# Patient Record
Sex: Male | Born: 1950 | Race: White | Hispanic: No | Marital: Married | Smoking: Former smoker
Health system: Southern US, Community
[De-identification: ages and names within clinical notes are randomized; demographics above are authoritative.]

## PROBLEM LIST (undated history)

## (undated) DIAGNOSIS — I10 Essential (primary) hypertension: Secondary | ICD-10-CM

## (undated) DIAGNOSIS — M4802 Spinal stenosis, cervical region: Secondary | ICD-10-CM

## (undated) DIAGNOSIS — E291 Testicular hypofunction: Secondary | ICD-10-CM

## (undated) DIAGNOSIS — F32A Depression, unspecified: Secondary | ICD-10-CM

## (undated) DIAGNOSIS — R7303 Prediabetes: Secondary | ICD-10-CM

## (undated) DIAGNOSIS — M109 Gout, unspecified: Secondary | ICD-10-CM

## (undated) DIAGNOSIS — I714 Abdominal aortic aneurysm, without rupture, unspecified: Secondary | ICD-10-CM

## (undated) DIAGNOSIS — E559 Vitamin D deficiency, unspecified: Secondary | ICD-10-CM

## (undated) DIAGNOSIS — F419 Anxiety disorder, unspecified: Secondary | ICD-10-CM

## (undated) DIAGNOSIS — K219 Gastro-esophageal reflux disease without esophagitis: Secondary | ICD-10-CM

## (undated) DIAGNOSIS — H409 Unspecified glaucoma: Secondary | ICD-10-CM

## (undated) DIAGNOSIS — F329 Major depressive disorder, single episode, unspecified: Secondary | ICD-10-CM

## (undated) DIAGNOSIS — E785 Hyperlipidemia, unspecified: Secondary | ICD-10-CM

## (undated) HISTORY — DX: Gastro-esophageal reflux disease without esophagitis: K21.9

## (undated) HISTORY — DX: Spinal stenosis, cervical region: M48.02

## (undated) HISTORY — DX: Gout, unspecified: M10.9

## (undated) HISTORY — DX: Hyperlipidemia, unspecified: E78.5

## (undated) HISTORY — DX: Abdominal aortic aneurysm, without rupture, unspecified: I71.40

## (undated) HISTORY — DX: Prediabetes: R73.03

## (undated) HISTORY — DX: Vitamin D deficiency, unspecified: E55.9

## (undated) HISTORY — DX: Depression, unspecified: F32.A

## (undated) HISTORY — DX: Essential (primary) hypertension: I10

## (undated) HISTORY — DX: Testicular hypofunction: E29.1

## (undated) HISTORY — DX: Anxiety disorder, unspecified: F41.9

## (undated) HISTORY — DX: Unspecified glaucoma: H40.9

## (undated) HISTORY — DX: Major depressive disorder, single episode, unspecified: F32.9

---

## 1998-05-13 ENCOUNTER — Ambulatory Visit (HOSPITAL_COMMUNITY): Admission: RE | Admit: 1998-05-13 | Discharge: 1998-05-13 | Payer: Self-pay | Admitting: Internal Medicine

## 1999-05-19 ENCOUNTER — Encounter: Payer: Self-pay | Admitting: Internal Medicine

## 1999-05-19 ENCOUNTER — Ambulatory Visit (HOSPITAL_COMMUNITY): Admission: RE | Admit: 1999-05-19 | Discharge: 1999-05-19 | Payer: Self-pay | Admitting: Internal Medicine

## 2000-06-18 ENCOUNTER — Ambulatory Visit (HOSPITAL_COMMUNITY): Admission: RE | Admit: 2000-06-18 | Discharge: 2000-06-18 | Payer: Self-pay | Admitting: Internal Medicine

## 2000-06-18 ENCOUNTER — Encounter: Payer: Self-pay | Admitting: Internal Medicine

## 2001-06-21 ENCOUNTER — Ambulatory Visit (HOSPITAL_COMMUNITY): Admission: RE | Admit: 2001-06-21 | Discharge: 2001-06-21 | Payer: Self-pay

## 2001-06-21 ENCOUNTER — Encounter: Payer: Self-pay | Admitting: Internal Medicine

## 2002-09-02 ENCOUNTER — Ambulatory Visit (HOSPITAL_COMMUNITY): Admission: RE | Admit: 2002-09-02 | Discharge: 2002-09-02 | Payer: Self-pay | Admitting: Internal Medicine

## 2002-09-02 ENCOUNTER — Encounter: Payer: Self-pay | Admitting: Internal Medicine

## 2003-10-29 ENCOUNTER — Ambulatory Visit (HOSPITAL_COMMUNITY): Admission: RE | Admit: 2003-10-29 | Discharge: 2003-10-29 | Payer: Self-pay | Admitting: Internal Medicine

## 2004-02-02 ENCOUNTER — Encounter: Admission: RE | Admit: 2004-02-02 | Discharge: 2004-02-02 | Payer: Self-pay | Admitting: Internal Medicine

## 2004-04-17 ENCOUNTER — Emergency Department (HOSPITAL_COMMUNITY): Admission: EM | Admit: 2004-04-17 | Discharge: 2004-04-17 | Payer: Self-pay | Admitting: Emergency Medicine

## 2004-09-14 ENCOUNTER — Ambulatory Visit (HOSPITAL_COMMUNITY): Admission: RE | Admit: 2004-09-14 | Discharge: 2004-09-14 | Payer: Self-pay | Admitting: Internal Medicine

## 2005-09-19 ENCOUNTER — Ambulatory Visit (HOSPITAL_COMMUNITY): Admission: RE | Admit: 2005-09-19 | Discharge: 2005-09-19 | Payer: Self-pay | Admitting: Internal Medicine

## 2010-11-02 ENCOUNTER — Ambulatory Visit (HOSPITAL_COMMUNITY)
Admission: RE | Admit: 2010-11-02 | Discharge: 2010-11-02 | Disposition: A | Discharge status: Home / Self Care | Payer: 59 | Source: Ambulatory Visit | Attending: Internal Medicine | Admitting: Internal Medicine

## 2010-11-02 ENCOUNTER — Other Ambulatory Visit (HOSPITAL_COMMUNITY): Payer: Self-pay | Admitting: Internal Medicine

## 2010-11-02 DIAGNOSIS — R209 Unspecified disturbances of skin sensation: Secondary | ICD-10-CM | POA: Insufficient documentation

## 2010-11-02 DIAGNOSIS — R52 Pain, unspecified: Secondary | ICD-10-CM

## 2010-11-02 DIAGNOSIS — M1990 Unspecified osteoarthritis, unspecified site: Secondary | ICD-10-CM | POA: Insufficient documentation

## 2011-03-22 ENCOUNTER — Other Ambulatory Visit: Payer: Self-pay | Admitting: Internal Medicine

## 2011-03-22 DIAGNOSIS — R200 Anesthesia of skin: Secondary | ICD-10-CM

## 2011-03-23 ENCOUNTER — Ambulatory Visit
Admission: RE | Admit: 2011-03-23 | Discharge: 2011-03-23 | Disposition: A | Discharge status: Home / Self Care | Payer: 59 | Source: Ambulatory Visit | Attending: Internal Medicine | Admitting: Internal Medicine

## 2011-03-23 DIAGNOSIS — R200 Anesthesia of skin: Secondary | ICD-10-CM

## 2013-06-13 ENCOUNTER — Other Ambulatory Visit: Payer: Self-pay | Admitting: Internal Medicine

## 2013-07-12 ENCOUNTER — Other Ambulatory Visit: Payer: Self-pay | Admitting: Internal Medicine

## 2013-07-21 ENCOUNTER — Other Ambulatory Visit: Payer: Self-pay | Admitting: Emergency Medicine

## 2013-07-21 MED ORDER — CITALOPRAM HYDROBROMIDE 40 MG PO TABS: ORAL_TABLET | ORAL | Status: DC

## 2013-08-23 ENCOUNTER — Encounter: Payer: Self-pay | Admitting: Physician Assistant

## 2013-08-23 DIAGNOSIS — I10 Essential (primary) hypertension: Secondary | ICD-10-CM

## 2013-08-23 DIAGNOSIS — R7303 Prediabetes: Secondary | ICD-10-CM

## 2013-08-23 DIAGNOSIS — Z79899 Other long term (current) drug therapy: Secondary | ICD-10-CM

## 2013-08-23 DIAGNOSIS — M109 Gout, unspecified: Secondary | ICD-10-CM

## 2013-08-23 DIAGNOSIS — E559 Vitamin D deficiency, unspecified: Secondary | ICD-10-CM

## 2013-08-23 DIAGNOSIS — F419 Anxiety disorder, unspecified: Secondary | ICD-10-CM

## 2013-08-23 DIAGNOSIS — K219 Gastro-esophageal reflux disease without esophagitis: Secondary | ICD-10-CM

## 2013-08-23 DIAGNOSIS — E782 Mixed hyperlipidemia: Secondary | ICD-10-CM | POA: Insufficient documentation

## 2013-08-23 DIAGNOSIS — R7309 Other abnormal glucose: Secondary | ICD-10-CM | POA: Insufficient documentation

## 2013-08-23 DIAGNOSIS — E785 Hyperlipidemia, unspecified: Secondary | ICD-10-CM

## 2013-08-23 DIAGNOSIS — E291 Testicular hypofunction: Secondary | ICD-10-CM

## 2013-08-25 ENCOUNTER — Encounter: Payer: Self-pay | Admitting: Internal Medicine

## 2013-09-02 ENCOUNTER — Encounter: Payer: Self-pay | Admitting: Internal Medicine

## 2013-09-27 ENCOUNTER — Other Ambulatory Visit: Payer: Self-pay | Admitting: Internal Medicine

## 2013-10-12 ENCOUNTER — Other Ambulatory Visit: Payer: Self-pay | Admitting: Internal Medicine

## 2013-12-20 ENCOUNTER — Other Ambulatory Visit: Payer: Self-pay | Admitting: Internal Medicine

## 2014-01-02 ENCOUNTER — Ambulatory Visit (INDEPENDENT_AMBULATORY_CARE_PROVIDER_SITE_OTHER): Payer: BC Managed Care – PPO | Admitting: Internal Medicine

## 2014-01-02 ENCOUNTER — Encounter: Payer: Self-pay | Admitting: Internal Medicine

## 2014-01-02 VITALS — BP 116/80 | HR 52 | Temp 97.7°F | Resp 16 | Ht 73.5 in | Wt 211.0 lb

## 2014-01-02 DIAGNOSIS — Z1212 Encounter for screening for malignant neoplasm of rectum: Secondary | ICD-10-CM

## 2014-01-02 DIAGNOSIS — Z125 Encounter for screening for malignant neoplasm of prostate: Secondary | ICD-10-CM

## 2014-01-02 DIAGNOSIS — R7402 Elevation of levels of lactic acid dehydrogenase (LDH): Secondary | ICD-10-CM

## 2014-01-02 DIAGNOSIS — Z113 Encounter for screening for infections with a predominantly sexual mode of transmission: Secondary | ICD-10-CM

## 2014-01-02 DIAGNOSIS — I10 Essential (primary) hypertension: Secondary | ICD-10-CM

## 2014-01-02 DIAGNOSIS — R7401 Elevation of levels of liver transaminase levels: Secondary | ICD-10-CM

## 2014-01-02 DIAGNOSIS — R740 Nonspecific elevation of levels of transaminase and lactic acid dehydrogenase [LDH]: Secondary | ICD-10-CM

## 2014-01-02 DIAGNOSIS — Z0000 Encounter for general adult medical examination without abnormal findings: Secondary | ICD-10-CM

## 2014-01-02 DIAGNOSIS — E559 Vitamin D deficiency, unspecified: Secondary | ICD-10-CM

## 2014-01-02 DIAGNOSIS — Z111 Encounter for screening for respiratory tuberculosis: Secondary | ICD-10-CM

## 2014-01-02 NOTE — Patient Instructions (Signed)

## 2014-01-02 NOTE — Progress Notes (Signed)
Patient ID: Cory Carrillo, male   DOB: Apr 06, 1951, 63 y.o.   MRN: [id number]   Annual Screening Comprehensive Examination  This very nice 62 y.o.MWM presents for complete physical.  Patient has been followed for HTN, testosterone ,  Prediabetes, Hyperlipidemia, and Vitamin D Deficiency.   HTN predates since 53. Patient's BP has been controlled at home.Today's BP: 116/80 mmHg. Pulse is noted 41-45 bpm on EKG today and patient does report occasional postural lightheadedness with standing. He does report random albeit infrequent BP's have been Normal wit pulses usually about 50-60 bpm. Patient denies any cardiac symptoms as chest pain, palpitations, shortness of breath, dizziness or ankle swelling.   Patient's hyperlipidemia is controlled with diet and medications. Patient denies myalgias or other medication SE's. Last cholesterol last visit was 182, triglycerides 135, HDL 45 and LDL 110 in April 2014.     Patient has prediabetes with A1c 5.7% in Feb 2012 and last A1c was 5.4% in  Jan 2014. Patient denies reactive hypoglycemic symptoms, visual blurring, diabetic polys or paresthesias.    Finally, patient has history of Testosterone Deficiency with Testosterone 247 in 2009 and 287 in 2011 and also has  Vitamin D Deficiency with A1c 5.7% in 2012.    and last vitamin D was 79 in Apr 2014   Medication List   aspirin 81 MG tablet  Take 81 mg by mouth daily.     atenolol 100 MG tablet  Commonly known as:  TENORMIN  TAKE 1 TABLET EVERY DAY FOR BLOOD PRESSURE     atorvastatin 80 MG tablet  Commonly known as:  LIPITOR  TAKE 1 TABLET EVERY DAY FOR CHOLESTEROL     citalopram 40 MG tablet  Commonly known as:  CELEXA  TAKE 1 TABLET EVERY DAY FOR MOOD     DIOVAN 320 MG tablet  Generic drug:  valsartan  TAKE AS DIRECTED     finasteride 5 MG tablet  Commonly known as:  PROSCAR  Take 5 mg by mouth daily.     latanoprost 0.005 % ophthalmic solution  Commonly known as:  XALATAN  Place 1 drop into  both eyes at bedtime.     MULTIVITAMIN PO  Take by mouth daily.     sildenafil 100 MG tablet  Commonly known as:  VIAGRA  Take 100 mg by mouth daily as needed for erectile dysfunction.     testosterone cypionate 200 MG/ML injection  Commonly known as:  DEPOTESTOTERONE CYPIONATE  INJECT 1.5 CC(ML) INTRAMUSCULARLY EVERY 2 WEEKS     VITAMIN D PO  Take 2,000 Units by mouth 2 (two) times daily.     Allergies  Allergen Reactions  . Ace Inhibitors     cough   Past Medical History  Diagnosis Date  . Hypertension   . Hyperlipidemia   . GERD (gastroesophageal reflux disease)   . Glaucoma   . Prediabetes   . Gout   . Anxiety   . Depression   . Cervical stenosis of spine   . Hypogonadism male   . Vitamin D deficiency    History reviewed. No pertinent past surgical history.  Family History  Problem Relation Age of Onset  . Cancer Mother     colon  . Heart disease Father   . Heart disease Brother   . Hypertension Brother    History   Social History  . Marital Status: Married    Spouse Name: N/A    Number of Children: N/A  . Years of Education: N/A  Occupational History  .  works in Physicist, medicalaudio- Associate Professorvisual field.   Social History Main Topics  . Smoking status: Former Smoker -- 1.50 packs/day    Quit date: 08/23/2006  . Smokeless tobacco: Never Used  . Alcohol Use: Yes  . Drug Use: No  . Sexual Activity: Not on file    ROS Constitutional: Denies fever, chills, weight loss/gain, headaches, insomnia, fatigue, night sweats or change in appetite. Eyes: Denies redness, blurred vision, diplopia, discharge, itchy or watery eyes.  ENT: Denies discharge, congestion, post nasal drip, epistaxis, sore throat, earache, hearing loss, dental pain, Tinnitus, Vertigo, Sinus pain or snoring.  Cardio: Denies chest pain, palpitations, irregular heartbeat, syncope, dyspnea, diaphoresis, orthopnea, PND, claudication or edema Respiratory: denies cough, dyspnea, DOE, pleurisy, hoarseness,  laryngitis or wheezing.  Gastrointestinal: Denies dysphagia, heartburn, reflux, water brash, pain, cramps, nausea, vomiting, bloating, diarrhea, constipation, hematemesis, melena, hematochezia, jaundice or hemorrhoids Genitourinary: Denies dysuria, frequency, urgency, nocturia, hesitancy, discharge, hematuria or flank pain Musculoskeletal: Denies arthralgia, myalgia, stiffness, Jt. Swelling, pain, limp or strain/sprain. Skin: Denies puritis, rash, hives, warts, acne, eczema or change in skin lesion Neuro: No weakness, tremor, incoordination, spasms, paresthesia or pain Psychiatric: Denies confusion, memory loss or sensory loss Endocrine: Denies change in weight, skin, hair change, nocturia, and paresthesia, diabetic polys, visual blurring or hyper / hypo glycemic episodes.  Heme/Lymph: No excessive bleeding, bruising or enlarged lymph nodes.  Physical Exam  BP 116/80  P 52  T 97.7 F   Resp 16  Ht 6' 1.5"   Wt 211 lb   BMI 27.46 kg/m2  General Appearance: Well nourished, in no apparent distress. Eyes: PERRLA, EOMs, conjunctiva no swelling or erythema, normal fundi and vessels. Sinuses: No frontal/maxillary tenderness ENT/Mouth: EACs patent / TMs  nl. Nares clear without erythema, swelling, mucoid exudates. Oral hygiene is good. No erythema, swelling, or exudate. Tongue normal, non-obstructing. Tonsils not swollen or erythematous. Hearing normal.  Neck: Supple, thyroid normal. No bruits, nodes or JVD. Respiratory: Respiratory effort normal.  BS equal and clear bilateral without rales, rhonci, wheezing or stridor. Cardio: Heart sounds are normal with regular rate and rhythm and no murmurs, rubs or gallops. Peripheral pulses are normal and equal bilaterally without edema. No aortic or femoral bruits. Chest: symmetric with normal excursions and percussion.  Abdomen: Flat, soft, with bowl sounds. Nontender, no guarding, rebound, hernias, masses, or organomegaly.  Lymphatics: Non tender  without lymphadenopathy.  Genitourinary: No hernias.Testes nl. DRE - prostate nl for age - smooth & firm w/o nodules. Musculoskeletal: Full ROM all peripheral extremities, joint stability, 5/5 strength, and normal gait. Skin: Warm and dry without rashes, lesions, cyanosis, clubbing or  ecchymosis.  Neuro: Cranial nerves intact, reflexes equal bilaterally. Normal muscle tone, no cerebellar symptoms. Sensation intact.  Pysch: Awake and oriented X 3, normal affect, insight and judgment appropriate.   Assessment and Plan  1. Annual Screening Examination 2. Hypertension  3. Hyperlipidemia 4. Pre Diabetes 5. Vitamin D Deficiency 6. Testosterone Deficiency  Continue prudent diet as discussed, weight control, BP monitoring, regular exercise, and medications as discussed.  Discussed med effects and SE's. Routine screening labs and tests as requested with regular follow-up as recommended.  Discussed /recommended changing Atenolol 100 mg from 1/2=50 mg to 1/4=25 mg qd and   Diovan/Valsartin 320 mg from 1/4=80 mg to 1/2 =160 mg qd or even up to 1 whole tab qd if needed to control BP.

## 2014-01-13 ENCOUNTER — Ambulatory Visit: Payer: Self-pay

## 2014-01-14 ENCOUNTER — Ambulatory Visit: Payer: BC Managed Care – PPO

## 2014-01-14 LAB — CBC WITH DIFFERENTIAL/PLATELET
BASOS PCT: 0 % (ref 0–1)
Basophils Absolute: 0 10*3/uL (ref 0.0–0.1)
EOS ABS: 0.1 10*3/uL (ref 0.0–0.7)
EOS PCT: 2 % (ref 0–5)
HEMATOCRIT: 43.5 % (ref 39.0–52.0)
HEMOGLOBIN: 15.2 g/dL (ref 13.0–17.0)
LYMPHS ABS: 1.5 10*3/uL (ref 0.7–4.0)
LYMPHS PCT: 34 % (ref 12–46)
MCH: 30.6 pg (ref 26.0–34.0)
MCHC: 34.9 g/dL (ref 30.0–36.0)
MCV: 87.7 fL (ref 78.0–100.0)
Monocytes Absolute: 0.3 10*3/uL (ref 0.1–1.0)
Monocytes Relative: 8 % (ref 3–12)
NEUTROS ABS: 2.4 10*3/uL (ref 1.7–7.7)
Neutrophils Relative %: 56 % (ref 43–77)
PLATELETS: 175 10*3/uL (ref 150–400)
RBC: 4.96 MIL/uL (ref 4.22–5.81)
RDW: 14.6 % (ref 11.5–15.5)
WBC: 4.3 10*3/uL (ref 4.0–10.5)

## 2014-01-14 LAB — HEMOGLOBIN A1C
Hgb A1c MFr Bld: 5.4 % (ref <5.7)
Mean Plasma Glucose: 108 mg/dL (ref <117)

## 2014-01-14 LAB — TB SKIN TEST
INDURATION: 0 mm
TB Skin Test: NEGATIVE

## 2014-01-15 ENCOUNTER — Encounter: Payer: Self-pay | Admitting: Internal Medicine

## 2014-01-15 LAB — HEPATITIS B SURFACE ANTIBODY,QUALITATIVE: Hep B S Ab: NEGATIVE

## 2014-01-15 LAB — HEPATIC FUNCTION PANEL
ALK PHOS: 44 U/L (ref 39–117)
ALT: 24 U/L (ref 0–53)
AST: 22 U/L (ref 0–37)
Albumin: 4.7 g/dL (ref 3.5–5.2)
BILIRUBIN TOTAL: 0.5 mg/dL (ref 0.2–1.2)
Bilirubin, Direct: 0.1 mg/dL (ref 0.0–0.3)
Indirect Bilirubin: 0.4 mg/dL (ref 0.2–1.2)
TOTAL PROTEIN: 6.6 g/dL (ref 6.0–8.3)

## 2014-01-15 LAB — PSA: PSA: 0.52 ng/mL (ref <=4.00)

## 2014-01-15 LAB — INSULIN, FASTING: Insulin fasting, serum: 8 u[IU]/mL (ref 3–28)

## 2014-01-15 LAB — MICROALBUMIN / CREATININE URINE RATIO
Creatinine, Urine: 40.7 mg/dL
MICROALB/CREAT RATIO: 12.3 mg/g (ref 0.0–30.0)
Microalb, Ur: 0.5 mg/dL (ref 0.00–1.89)

## 2014-01-15 LAB — URINALYSIS, MICROSCOPIC ONLY
BACTERIA UA: NONE SEEN
CASTS: NONE SEEN
Crystals: NONE SEEN
Squamous Epithelial / LPF: NONE SEEN

## 2014-01-15 LAB — LIPID PANEL
CHOL/HDL RATIO: 2.5 ratio
Cholesterol: 150 mg/dL (ref 0–200)
HDL: 59 mg/dL (ref >39)
LDL Cholesterol: 73 mg/dL (ref 0–99)
TRIGLYCERIDES: 91 mg/dL (ref <150)
VLDL: 18 mg/dL (ref 0–40)

## 2014-01-15 LAB — BASIC METABOLIC PANEL WITH GFR
BUN: 16 mg/dL (ref 6–23)
CALCIUM: 9.4 mg/dL (ref 8.4–10.5)
CHLORIDE: 100 meq/L (ref 96–112)
CO2: 26 mEq/L (ref 19–32)
Creat: 0.9 mg/dL (ref 0.50–1.35)
Glucose, Bld: 92 mg/dL (ref 70–99)
Potassium: 4.8 mEq/L (ref 3.5–5.3)
SODIUM: 136 meq/L (ref 135–145)

## 2014-01-15 LAB — TSH: TSH: 1.547 u[IU]/mL (ref 0.350–4.500)

## 2014-01-15 LAB — VITAMIN B12: VITAMIN B 12: 266 pg/mL (ref 211–911)

## 2014-01-15 LAB — HEPATITIS B CORE ANTIBODY, TOTAL: Hep B Core Total Ab: NONREACTIVE

## 2014-01-15 LAB — MAGNESIUM: MAGNESIUM: 1.9 mg/dL (ref 1.5–2.5)

## 2014-01-15 LAB — VITAMIN D 25 HYDROXY (VIT D DEFICIENCY, FRACTURES): Vit D, 25-Hydroxy: 82 ng/mL (ref 30–89)

## 2014-01-15 LAB — HEPATITIS C ANTIBODY: HCV Ab: NEGATIVE

## 2014-01-15 LAB — RPR

## 2014-01-15 LAB — HEPATITIS A ANTIBODY, TOTAL: HEP A TOTAL AB: NONREACTIVE

## 2014-01-15 LAB — TESTOSTERONE: Testosterone: 944 ng/dL — ABNORMAL HIGH (ref 300–890)

## 2014-01-15 LAB — HIV ANTIBODY (ROUTINE TESTING W REFLEX): HIV: NONREACTIVE

## 2014-01-16 ENCOUNTER — Encounter: Payer: Self-pay | Admitting: Internal Medicine

## 2014-01-16 LAB — HEPATITIS B E ANTIBODY: Hepatitis Be Antibody: NONREACTIVE

## 2014-04-09 ENCOUNTER — Ambulatory Visit: Payer: Self-pay | Admitting: Physician Assistant

## 2014-05-14 ENCOUNTER — Ambulatory Visit: Payer: Self-pay | Admitting: Physician Assistant

## 2014-05-15 ENCOUNTER — Other Ambulatory Visit: Payer: Self-pay

## 2014-06-07 ENCOUNTER — Other Ambulatory Visit: Payer: Self-pay | Admitting: Internal Medicine

## 2014-07-06 ENCOUNTER — Other Ambulatory Visit: Payer: Self-pay | Admitting: Internal Medicine

## 2014-07-06 DIAGNOSIS — N529 Male erectile dysfunction, unspecified: Secondary | ICD-10-CM

## 2014-07-06 MED ORDER — TADALAFIL 20 MG PO TABS: ORAL_TABLET | ORAL | Status: DC

## 2014-07-16 ENCOUNTER — Encounter: Payer: Self-pay | Admitting: Internal Medicine

## 2014-07-16 ENCOUNTER — Ambulatory Visit (INDEPENDENT_AMBULATORY_CARE_PROVIDER_SITE_OTHER): Payer: BC Managed Care – PPO | Admitting: Internal Medicine

## 2014-07-16 VITALS — BP 136/84 | HR 56 | Temp 97.3°F | Resp 16 | Ht 73.5 in | Wt 208.4 lb

## 2014-07-16 DIAGNOSIS — E785 Hyperlipidemia, unspecified: Secondary | ICD-10-CM

## 2014-07-16 DIAGNOSIS — I10 Essential (primary) hypertension: Secondary | ICD-10-CM

## 2014-07-16 DIAGNOSIS — R7303 Prediabetes: Secondary | ICD-10-CM

## 2014-07-16 DIAGNOSIS — R7309 Other abnormal glucose: Secondary | ICD-10-CM

## 2014-07-16 DIAGNOSIS — E291 Testicular hypofunction: Secondary | ICD-10-CM

## 2014-07-16 DIAGNOSIS — E559 Vitamin D deficiency, unspecified: Secondary | ICD-10-CM

## 2014-07-16 DIAGNOSIS — Z79899 Other long term (current) drug therapy: Secondary | ICD-10-CM

## 2014-07-16 NOTE — Progress Notes (Signed)
Patient ID: Cory Carrillo, male   DOB: 02-18-1951, 63 y.o.   MRN: [id number]   This very nice 63 y.o.MWM presents for 3 month follow up with Hypertension, Hyperlipidemia, Pre-Diabetes, Testosterone  and Vitamin D Deficiency.    Patient is treated for HTN (1994)  & BP has been controlled at home. Today's BP: 136/84 mmHg. Patient has had no complaints of any cardiac type chest pain, palpitations, dyspnea/orthopnea/PND, dizziness, claudication, or dependent edema.   Hyperlipidemia is controlled with diet & meds. Patient denies myalgias or other med SE's. Last Lipids were at goal - Total  Chol 150; HDL 59; LDL 73; Trig 91 on 01/14/2014.   Also, the patient is obese (BMI 27.12) and is screened expectantly for PreDiabetes and has had no symptoms of reactive hypoglycemia, diabetic polys, paresthesias or visual blurring.  Last A1c was  5.4% on  01/14/2014.   Patient is on testosterone replacement therapy with improved sense of stamina & well being. Further, the patient also has history of Vitamin D Deficiency and supplements vitamin D without any suspected side-effects. Last vitamin D was 82 on  01/14/2014.    Medication List   atenolol 100 MG tablet  TAKE 1 TABLET EVERY DAY FOR BLOOD PRESSURE     atorvastatin 80 MG tablet  TAKE 1 TABLET EVERY DAY FOR CHOLESTEROL     citalopram 40 MG tablet  TAKE 1 TABLET EVERY DAY FOR MOOD     finasteride 5 MG tablet  TAKE 1 TABLET EVERY DAY     latanoprost  (XALATAN ) 0.005 % ophthalmic solution  Place 1 drop into both eyes at bedtime.     MULTIVITAMIN PO  Take by mouth daily.     tadalafil 20 MG tablet  Take 1/2 to 1 tablet every 2 to 3 days if needed for XXXX     testosterone cypionate 200 MG/ML injection  INJECT 1.5 CC(ML) INTRAMUSCULARLY EVERY 2 WEEKS     VITAMIN D PO  Take 2,000 Units by mouth 2 times daily. = 4,000 u/da     Allergies  Allergen Reactions  . Ace Inhibitors     cough   PMHx:   Past Medical History  Diagnosis Date  .  Hypertension   . Hyperlipidemia   . GERD (gastroesophageal reflux disease)   . Glaucoma   . Prediabetes   . Gout   . Anxiety   . Depression   . Cervical stenosis of spine   . Hypogonadism male   . Vitamin D deficiency    Immunization History  Administered Date(s) Administered  . PPD Test 01/02/2014  . Pneumococcal-Unspecified 08/23/1992  . Tdap 08/19/2012   FHx:    Reviewed / unchanged  SHx:    Reviewed / unchanged  Systems Review:  Constitutional: Denies fever, chills, wt changes, headaches, insomnia, fatigue, night sweats, change in appetite. Eyes: Denies redness, blurred vision, diplopia, discharge, itchy, watery eyes.  ENT: Denies discharge, congestion, post nasal drip, epistaxis, sore throat, earache, hearing loss, dental pain, tinnitus, vertigo, sinus pain, snoring.  CV: Denies chest pain, palpitations, irregular heartbeat, syncope, dyspnea, diaphoresis, orthopnea, PND, claudication or edema. Respiratory: denies cough, dyspnea, DOE, pleurisy, hoarseness, laryngitis, wheezing.  Gastrointestinal: Denies dysphagia, odynophagia, heartburn, reflux, water brash, abdominal pain or cramps, nausea, vomiting, bloating, diarrhea, constipation, hematemesis, melena, hematochezia  or hemorrhoids. Genitourinary: Denies dysuria, frequency, urgency, nocturia, hesitancy, discharge, hematuria or flank pain. Musculoskeletal: Denies arthralgias, myalgias, stiffness, jt. swelling, pain, limping or strain/sprain.  Skin: Denies pruritus, rash, hives, warts, acne, eczema or  change in skin lesion(s). Neuro: No weakness, tremor, incoordination, spasms, paresthesia or pain. Psychiatric: Denies confusion, memory loss or sensory loss. Endo: Denies change in weight, skin or hair change.  Heme/Lymph: No excessive bleeding, bruising or enlarged lymph nodes.  Physical Exam  BP 136/84   Pulse 56  Temp 97.3 F   Resp 16  Ht 6' 1.5"   Wt 208 lb 6.4 oz                 BMI 27.12   Appears well  nourished and in no distress. Eyes: PERRLA, EOMs, conjunctiva no swelling or erythema. Sinuses: No frontal/maxillary tenderness ENT/Mouth: EAC's clear, TM's nl w/o erythema, bulging. Nares clear w/o erythema, swelling, exudates. Oropharynx clear without erythema or exudates. Oral hygiene is good. Tongue normal, non obstructing. Hearing intact.  Neck: Supple. Thyroid nl. Car 2+/2+ without bruits, nodes or JVD. Chest: Respirations nl with BS clear & equal w/o rales, rhonchi, wheezing or stridor.  Cor: Heart sounds normal w/ regular rate and rhythm without sig. murmurs, gallops, clicks, or rubs. Peripheral pulses normal and equal  without edema.  Abdomen: Soft & bowel sounds normal. Non-tender w/o guarding, rebound, hernias, masses, or organomegaly.  Lymphatics: Unremarkable.  Musculoskeletal: Full ROM all peripheral extremities, joint stability, 5/5 strength, and normal gait.  Skin: Warm, dry without exposed rashes, lesions or ecchymosis apparent.  Neuro: Cranial nerves intact, reflexes equal bilaterally. Sensory-motor testing grossly intact. Tendon reflexes grossly intact.  Pysch: Alert & oriented x 3.  Insight and judgement nl & appropriate. No ideations.  Assessment and Plan:  1. Hypertension - Continue monitor blood pressure at home. Continue diet/meds same.  2. Hyperlipidemia - Continue diet/meds, exercise,& lifestyle modifications. Continue monitor periodic cholesterol/liver & renal functions   3. Pre-Diabetes Screening - Continue diet, exercise, lifestyle modifications. Monitor appropriate labs.  4. Vitamin D Deficiency - Continue supplementation.   Recommended regular exercise, BP monitoring, weight control, and discussed med and SE's. Recommended labs to assess and monitor clinical status. Further disposition pending results of labs.

## 2014-07-16 NOTE — Patient Instructions (Signed)

## 2014-07-17 LAB — MAGNESIUM: MAGNESIUM: 1.9 mg/dL (ref 1.5–2.5)

## 2014-07-17 LAB — CBC WITH DIFFERENTIAL/PLATELET
BASOS PCT: 1 % (ref 0–1)
Basophils Absolute: 0.1 10*3/uL (ref 0.0–0.1)
EOS ABS: 0.1 10*3/uL (ref 0.0–0.7)
Eosinophils Relative: 2 % (ref 0–5)
HEMATOCRIT: 47 % (ref 39.0–52.0)
Hemoglobin: 16 g/dL (ref 13.0–17.0)
LYMPHS ABS: 1.9 10*3/uL (ref 0.7–4.0)
Lymphocytes Relative: 34 % (ref 12–46)
MCH: 30.1 pg (ref 26.0–34.0)
MCHC: 34 g/dL (ref 30.0–36.0)
MCV: 88.3 fL (ref 78.0–100.0)
MONO ABS: 0.4 10*3/uL (ref 0.1–1.0)
MONOS PCT: 7 % (ref 3–12)
MPV: 12.3 fL (ref 9.4–12.4)
Neutro Abs: 3.1 10*3/uL (ref 1.7–7.7)
Neutrophils Relative %: 56 % (ref 43–77)
Platelets: 170 10*3/uL (ref 150–400)
RBC: 5.32 MIL/uL (ref 4.22–5.81)
RDW: 14.1 % (ref 11.5–15.5)
WBC: 5.6 10*3/uL (ref 4.0–10.5)

## 2014-07-17 LAB — BASIC METABOLIC PANEL WITH GFR
BUN: 20 mg/dL (ref 6–23)
CALCIUM: 9.7 mg/dL (ref 8.4–10.5)
CO2: 25 mEq/L (ref 19–32)
Chloride: 101 mEq/L (ref 96–112)
Creat: 1.14 mg/dL (ref 0.50–1.35)
GFR, EST AFRICAN AMERICAN: 80 mL/min
GFR, Est Non African American: 69 mL/min
GLUCOSE: 75 mg/dL (ref 70–99)
Potassium: 4.5 mEq/L (ref 3.5–5.3)
Sodium: 135 mEq/L (ref 135–145)

## 2014-07-17 LAB — HEPATIC FUNCTION PANEL
ALT: 35 U/L (ref 0–53)
AST: 22 U/L (ref 0–37)
Albumin: 4.7 g/dL (ref 3.5–5.2)
Alkaline Phosphatase: 50 U/L (ref 39–117)
Bilirubin, Direct: 0.1 mg/dL (ref 0.0–0.3)
Indirect Bilirubin: 0.5 mg/dL (ref 0.2–1.2)
Total Bilirubin: 0.6 mg/dL (ref 0.2–1.2)
Total Protein: 7 g/dL (ref 6.0–8.3)

## 2014-07-17 LAB — LIPID PANEL
Cholesterol: 201 mg/dL — ABNORMAL HIGH (ref 0–200)
HDL: 66 mg/dL (ref >39)
LDL Cholesterol: 89 mg/dL (ref 0–99)
Total CHOL/HDL Ratio: 3 Ratio
Triglycerides: 231 mg/dL — ABNORMAL HIGH (ref <150)
VLDL: 46 mg/dL — AB (ref 0–40)

## 2014-07-17 LAB — HEMOGLOBIN A1C
Hgb A1c MFr Bld: 5.3 % (ref <5.7)
Mean Plasma Glucose: 105 mg/dL (ref <117)

## 2014-07-17 LAB — VITAMIN D 25 HYDROXY (VIT D DEFICIENCY, FRACTURES): VIT D 25 HYDROXY: 47 ng/mL (ref 30–100)

## 2014-07-17 LAB — TSH: TSH: 1.032 u[IU]/mL (ref 0.350–4.500)

## 2014-07-17 LAB — INSULIN, FASTING: Insulin fasting, serum: 6.8 u[IU]/mL (ref 2.0–19.6)

## 2014-08-08 ENCOUNTER — Other Ambulatory Visit: Payer: Self-pay | Admitting: Emergency Medicine

## 2014-08-27 ENCOUNTER — Encounter: Payer: Self-pay | Admitting: Internal Medicine

## 2014-10-15 ENCOUNTER — Ambulatory Visit (INDEPENDENT_AMBULATORY_CARE_PROVIDER_SITE_OTHER): Payer: BLUE CROSS/BLUE SHIELD | Admitting: Physician Assistant

## 2014-10-15 ENCOUNTER — Encounter: Payer: Self-pay | Admitting: Physician Assistant

## 2014-10-15 VITALS — BP 132/80 | HR 60 | Temp 97.7°F | Resp 16 | Ht 73.5 in | Wt 217.0 lb

## 2014-10-15 DIAGNOSIS — R7303 Prediabetes: Secondary | ICD-10-CM

## 2014-10-15 DIAGNOSIS — M545 Low back pain, unspecified: Secondary | ICD-10-CM

## 2014-10-15 DIAGNOSIS — I10 Essential (primary) hypertension: Secondary | ICD-10-CM

## 2014-10-15 DIAGNOSIS — R7309 Other abnormal glucose: Secondary | ICD-10-CM

## 2014-10-15 DIAGNOSIS — Z79899 Other long term (current) drug therapy: Secondary | ICD-10-CM

## 2014-10-15 DIAGNOSIS — E291 Testicular hypofunction: Secondary | ICD-10-CM

## 2014-10-15 DIAGNOSIS — E785 Hyperlipidemia, unspecified: Secondary | ICD-10-CM

## 2014-10-15 DIAGNOSIS — E559 Vitamin D deficiency, unspecified: Secondary | ICD-10-CM

## 2014-10-15 LAB — LIPID PANEL
CHOLESTEROL: 178 mg/dL (ref 0–200)
HDL: 69 mg/dL (ref >=40)
LDL Cholesterol: 92 mg/dL (ref 0–99)
Total CHOL/HDL Ratio: 2.6 Ratio
Triglycerides: 86 mg/dL (ref <150)
VLDL: 17 mg/dL (ref 0–40)

## 2014-10-15 LAB — CBC WITH DIFFERENTIAL/PLATELET
BASOS PCT: 1 % (ref 0–1)
Basophils Absolute: 0.1 10*3/uL (ref 0.0–0.1)
EOS ABS: 0.1 10*3/uL (ref 0.0–0.7)
EOS PCT: 2 % (ref 0–5)
HEMATOCRIT: 47.7 % (ref 39.0–52.0)
Hemoglobin: 15.9 g/dL (ref 13.0–17.0)
Lymphocytes Relative: 24 % (ref 12–46)
Lymphs Abs: 1.4 10*3/uL (ref 0.7–4.0)
MCH: 30.5 pg (ref 26.0–34.0)
MCHC: 33.3 g/dL (ref 30.0–36.0)
MCV: 91.6 fL (ref 78.0–100.0)
MPV: 12 fL (ref 8.6–12.4)
Monocytes Absolute: 0.6 10*3/uL (ref 0.1–1.0)
Monocytes Relative: 10 % (ref 3–12)
NEUTROS ABS: 3.6 10*3/uL (ref 1.7–7.7)
Neutrophils Relative %: 63 % (ref 43–77)
Platelets: 173 10*3/uL (ref 150–400)
RBC: 5.21 MIL/uL (ref 4.22–5.81)
RDW: 14.4 % (ref 11.5–15.5)
WBC: 5.7 10*3/uL (ref 4.0–10.5)

## 2014-10-15 LAB — HEPATIC FUNCTION PANEL
ALT: 25 U/L (ref 0–53)
AST: 20 U/L (ref 0–37)
Albumin: 4.6 g/dL (ref 3.5–5.2)
Alkaline Phosphatase: 40 U/L (ref 39–117)
Bilirubin, Direct: 0.1 mg/dL (ref 0.0–0.3)
Indirect Bilirubin: 0.5 mg/dL (ref 0.2–1.2)
Total Bilirubin: 0.6 mg/dL (ref 0.2–1.2)
Total Protein: 6.5 g/dL (ref 6.0–8.3)

## 2014-10-15 LAB — BASIC METABOLIC PANEL WITH GFR
BUN: 14 mg/dL (ref 6–23)
CO2: 26 mEq/L (ref 19–32)
Calcium: 9.6 mg/dL (ref 8.4–10.5)
Chloride: 102 mEq/L (ref 96–112)
Creat: 0.94 mg/dL (ref 0.50–1.35)
GFR, Est Non African American: 87 mL/min
Glucose, Bld: 83 mg/dL (ref 70–99)
Potassium: 4.4 mEq/L (ref 3.5–5.3)
Sodium: 139 mEq/L (ref 135–145)

## 2014-10-15 LAB — TSH: TSH: 0.853 u[IU]/mL (ref 0.350–4.500)

## 2014-10-15 LAB — MAGNESIUM: MAGNESIUM: 1.9 mg/dL (ref 1.5–2.5)

## 2014-10-15 MED ORDER — ATENOLOL 50 MG PO TABS: ORAL_TABLET | ORAL | Status: DC

## 2014-10-15 MED ORDER — MELOXICAM 15 MG PO TABS: ORAL_TABLET | ORAL | Status: DC

## 2014-10-15 NOTE — Progress Notes (Signed)
Assessment and Plan:  1. Hypertension -Continue medication, monitor blood pressure at home. Continue DASH diet.  Reminder to go to the ER if any CP, SOB, nausea, dizziness, severe HA, changes vision/speech, left arm numbness and tingling and jaw pain.  2. Cholesterol -Continue diet and exercise. Check cholesterol.   3. Prediabetes  -Continue diet and exercise. Check A1C  4. Vitamin D Def - check level and continue medications.   5. Hypogonadism - continue to monitor, states medication is helping with symptoms of low T.   6. Lower back pain- negative straight leg Mobic, RICE, and exercises given If not better follow up in office or will refer to orthopedics.  Continue diet and meds as discussed. Further disposition pending results of labs.  HPI 64 y.o. male  presents for 3 month follow up   has a past medical history of Hypertension; Hyperlipidemia; GERD (gastroesophageal reflux disease); Glaucoma; Prediabetes; Gout; Anxiety; Depression; Cervical stenosis of spine; Hypogonadism male; and Vitamin D deficiency.  His blood pressure has been controlled at home, he is on atenolol  but only takes , today their BP is BP: 132/80 mmHg  He does not workout, and no regular exercise but with the warmer weather he will get more exercise. He denies chest pain, shortness of breath, dizziness.  He is on cholesterol medication, lipitor 80 and take  M,W,F and denies myalgias. His cholesterol is at goal. The cholesterol last visit was:   Lab Results  Component Value Date   CHOL 201* 07/16/2014   HDL 66 07/16/2014   LDLCALC 89 07/16/2014   TRIG 231* 07/16/2014   CHOLHDL 3.0 07/16/2014    He has been working on diet and exercise for prediabetes, and denies paresthesia of the feet, polydipsia, polyuria and visual disturbances. Last A1C in the office was:  Lab Results  Component Value Date   HGBA1C 5.3 07/16/2014  Patient is on Vitamin D supplement.   Lab Results  Component Value Date    VD25OH 47 07/16/2014  He has a history of testosterone deficiency and is on testosterone replacement, due this Sunday, does 2 cc. He states that the testosterone helps with his energy, libido, muscle mass. Lab Results  Component Value Date   TESTOSTERONE 944* 01/14/2014      Current Medications:  Current Outpatient Prescriptions on File Prior to Visit  Medication Sig Dispense Refill  . aspirin 81 MG tablet Take 81 mg by mouth daily.    Marland Kitchen atenolol (TENORMIN) 100 MG tablet TAKE 1 TABLET EVERY DAY FOR BLOOD PRESSURE 90 tablet 2  . atorvastatin (LIPITOR) 80 MG tablet TAKE 1 TABLET EVERY DAY FOR CHOLESTEROL 30 tablet 3  . Cholecalciferol (VITAMIN D PO) Take 2,000 Units by mouth 2 (two) times daily.    . citalopram (CELEXA) 40 MG tablet TAKE 1 TABLET EVERY DAY FOR MOOD 90 tablet 0  . finasteride (PROSCAR) 5 MG tablet TAKE 1 TABLET EVERY DAY 90 tablet 99  . latanoprost (XALATAN) 0.005 % ophthalmic solution Place 1 drop into both eyes at bedtime.    . Multiple Vitamins-Minerals (MULTIVITAMIN PO) Take by mouth daily.    . tadalafil (CIALIS) 20 MG tablet Take 1/2 to 1 tablet every 2 to 3 days if needed for XXXX 12 tablet 99  . VIAGRA 100 MG tablet      No current facility-administered medications on file prior to visit.   Medical History:  Past Medical History  Diagnosis Date  . Hypertension   . Hyperlipidemia   . GERD (gastroesophageal  reflux disease)   . Glaucoma   . Prediabetes   . Gout   . Anxiety   . Depression   . Cervical stenosis of spine   . Hypogonadism male   . Vitamin D deficiency    Allergies:  Allergies  Allergen Reactions  . Ace Inhibitors     cough     Review of Systems:  Review of Systems  Constitutional: Negative.   HENT: Negative.   Eyes: Negative.   Respiratory: Negative.   Cardiovascular: Negative.   Gastrointestinal: Negative.   Genitourinary: Negative.   Musculoskeletal: Positive for back pain (lower back pain, had ESI 1 year ago) and joint pain  (left jaw popping and clicking, 3 x a month). Negative for myalgias, falls and neck pain.  Skin: Negative.   Neurological: Negative.   Endo/Heme/Allergies: Negative.   Psychiatric/Behavioral: Negative.     Family history- Review and unchanged Social history- Review and unchanged Physical Exam: BP 132/80 mmHg  Pulse 60  Temp(Src) 97.7 F (36.5 C)  Resp 16  Ht 6' 1.5" (1.867 m)  Wt 217 lb (98.431 kg)  BMI 28.24 kg/m2 Wt Readings from Last 3 Encounters:  10/15/14 217 lb (98.431 kg)  07/16/14 208 lb 6.4 oz (94.53 kg)  01/02/14 211 lb (95.709 kg)   General Appearance: Well nourished, in no apparent distress. Eyes: PERRLA, EOMs, conjunctiva no swelling or erythema Sinuses: No Frontal/maxillary tenderness ENT/Mouth: Ext aud canals clear, TMs without erythema, bulging. No erythema, swelling, or exudate on post pharynx.  Tonsils not swollen or erythematous. Hearing normal.  Neck: Supple, thyroid normal.  Respiratory: Respiratory effort normal, BS equal bilaterally without rales, rhonchi, wheezing or stridor.  Cardio: RRR with no MRGs. Brisk peripheral pulses without edema.  Abdomen: Soft, + BS,  Non tender, no guarding, rebound, hernias, masses. Lymphatics: Non tender without lymphadenopathy.  Musculoskeletal: Full ROM, 5/5 strength, Patient is able to ambulate well. Gait is not  Antalgic. Straight leg raising with dorsiflexion negative bilaterally for radicular symptoms. Sensory exam in the legs are normal. Knee reflexes are normal Ankle reflexes are normal Strength is normal and symmetric in arms and legs. There is not SI tenderness to palpation.  There isparaspinal muscle spasm.  There is not midline tenderness.  ROM of spine with  limited in flexion due to pain.  Skin: Warm, dry without rashes, lesions, ecchymosis.  Neuro: Cranial nerves intact. Normal muscle tone, no cerebellar symptoms. Psych: Awake and oriented X 3, normal affect, Insight and Judgment appropriate.    Quentin Mullingollier,  Jansen Sciuto, PA-C 2:40 PM [facility]

## 2014-10-15 NOTE — Patient Instructions (Addendum)
Benefiber is good for constipation/diarrhea/irritable bowel syndrome, it helps with weight loss and can help lower your bad cholesterol. Please do 1-2 TBSP in the morning in water, coffee, or tea. It can take up to a month before you can see a difference with your bowel movements. It is cheapest from [employer], [employer], [employer].   Your LDL is not in range, 07/16/2014: LDL (calc) 89. Your LDL is the bad cholesterol that can lead to heart attack and stroke. To lower your number you can decrease your fatty foods, red meat, cheese, milk and increase fiber like whole grains and veggies. You can also add a fiber supplement like Metamucil or Benefiber.    Back Exercises Back exercises help treat and prevent back injuries. The goal of back exercises is to increase the strength of your abdominal and back muscles and the flexibility of your back. These exercises should be started when you no longer have back pain. Back exercises include:  Pelvic Tilt. Lie on your back with your knees bent. Tilt your pelvis until the lower part of your back is against the floor. Hold this position 5 to 10 sec and repeat 5 to 10 times.  Knee to Chest. Pull first 1 knee up against your chest and hold for 20 to 30 seconds, repeat this with the other knee, and then both knees. This may be done with the other leg straight or bent, whichever feels better.  Sit-Ups or Curl-Ups. Bend your knees 90 degrees. Start with tilting your pelvis, and do a partial, slow sit-up, lifting your trunk only 30 to 45 degrees off the floor. Take at least 2 to 3 seconds for each sit-up. Do not do sit-ups with your knees out straight. If partial sit-ups are difficult, simply do the above but with only tightening your abdominal muscles and holding it as directed.  Hip-Lift. Lie on your back with your knees flexed 90 degrees. Push down with your feet and shoulders as you raise your hips a couple inches off the floor; hold for 10 seconds, repeat 5 to 10  times.  Back arches. Lie on your stomach, propping yourself up on bent elbows. Slowly press on your hands, causing an arch in your low back. Repeat 3 to 5 times. Any initial stiffness and discomfort should lessen with repetition over time.  Shoulder-Lifts. Lie face down with arms beside your body. Keep hips and torso pressed to floor as you slowly lift your head and shoulders off the floor. Do not overdo your exercises, especially in the beginning. Exercises may cause you some mild back discomfort which lasts for a few minutes; however, if the pain is more severe, or lasts for more than 15 minutes, do not continue exercises until you see your caregiver. Improvement with exercise therapy for back problems is slow.  See your caregivers for assistance with developing a proper back exercise program. Document Released: 08/24/2004 Document Revised: 10/09/2011 Document Reviewed: 05/18/2011 [facility] Patient Information 2015 [employer] [place]. This information is not intended to replace advice given to you by your health care provider. Make sure you discuss any questions you have with your health care provider.

## 2014-10-16 LAB — VITAMIN D 25 HYDROXY (VIT D DEFICIENCY, FRACTURES): Vit D, 25-Hydroxy: 57 ng/mL (ref 30–100)

## 2014-11-01 ENCOUNTER — Other Ambulatory Visit: Payer: Self-pay | Admitting: Emergency Medicine

## 2014-11-23 ENCOUNTER — Other Ambulatory Visit: Payer: Self-pay | Admitting: *Deleted

## 2014-11-23 MED ORDER — ATORVASTATIN CALCIUM 80 MG PO TABS
ORAL_TABLET | ORAL | Status: DC
Start: 2014-11-23 — End: 2017-04-06

## 2015-01-07 ENCOUNTER — Encounter: Payer: Self-pay | Admitting: Internal Medicine

## 2015-01-21 ENCOUNTER — Ambulatory Visit (INDEPENDENT_AMBULATORY_CARE_PROVIDER_SITE_OTHER): Payer: 59 | Admitting: Internal Medicine

## 2015-01-21 ENCOUNTER — Encounter: Payer: Self-pay | Admitting: Internal Medicine

## 2015-01-21 VITALS — BP 156/84 | HR 60 | Temp 96.9°F | Resp 16 | Ht 73.75 in | Wt 212.8 lb

## 2015-01-21 DIAGNOSIS — R5383 Other fatigue: Secondary | ICD-10-CM

## 2015-01-21 DIAGNOSIS — Z1212 Encounter for screening for malignant neoplasm of rectum: Secondary | ICD-10-CM

## 2015-01-21 DIAGNOSIS — E291 Testicular hypofunction: Secondary | ICD-10-CM

## 2015-01-21 DIAGNOSIS — R7303 Prediabetes: Secondary | ICD-10-CM

## 2015-01-21 DIAGNOSIS — M1000 Idiopathic gout, unspecified site: Secondary | ICD-10-CM

## 2015-01-21 DIAGNOSIS — R7309 Other abnormal glucose: Secondary | ICD-10-CM

## 2015-01-21 DIAGNOSIS — K219 Gastro-esophageal reflux disease without esophagitis: Secondary | ICD-10-CM

## 2015-01-21 DIAGNOSIS — I10 Essential (primary) hypertension: Secondary | ICD-10-CM

## 2015-01-21 DIAGNOSIS — Z79899 Other long term (current) drug therapy: Secondary | ICD-10-CM

## 2015-01-21 DIAGNOSIS — E559 Vitamin D deficiency, unspecified: Secondary | ICD-10-CM

## 2015-01-21 DIAGNOSIS — Z125 Encounter for screening for malignant neoplasm of prostate: Secondary | ICD-10-CM

## 2015-01-21 DIAGNOSIS — E785 Hyperlipidemia, unspecified: Secondary | ICD-10-CM

## 2015-01-21 DIAGNOSIS — Z111 Encounter for screening for respiratory tuberculosis: Secondary | ICD-10-CM

## 2015-01-21 DIAGNOSIS — Z6827 Body mass index (BMI) 27.0-27.9, adult: Secondary | ICD-10-CM

## 2015-01-21 LAB — HEPATIC FUNCTION PANEL
ALBUMIN: 4.5 g/dL (ref 3.5–5.2)
ALT: 28 U/L (ref 0–53)
AST: 23 U/L (ref 0–37)
Alkaline Phosphatase: 55 U/L (ref 39–117)
BILIRUBIN INDIRECT: 0.7 mg/dL (ref 0.2–1.2)
Bilirubin, Direct: 0.2 mg/dL (ref 0.0–0.3)
Total Bilirubin: 0.9 mg/dL (ref 0.2–1.2)
Total Protein: 6.8 g/dL (ref 6.0–8.3)

## 2015-01-21 LAB — IRON AND TIBC
%SAT: 61 % — AB (ref 20–55)
IRON: 213 ug/dL — AB (ref 42–165)
TIBC: 347 ug/dL (ref 215–435)
UIBC: 134 ug/dL (ref 125–400)

## 2015-01-21 LAB — CBC WITH DIFFERENTIAL/PLATELET
BASOS ABS: 0.1 10*3/uL (ref 0.0–0.1)
Basophils Relative: 1 % (ref 0–1)
EOS ABS: 0.1 10*3/uL (ref 0.0–0.7)
EOS PCT: 2 % (ref 0–5)
HEMATOCRIT: 50.4 % (ref 39.0–52.0)
HEMOGLOBIN: 17.3 g/dL — AB (ref 13.0–17.0)
LYMPHS PCT: 28 % (ref 12–46)
Lymphs Abs: 1.5 10*3/uL (ref 0.7–4.0)
MCH: 31.3 pg (ref 26.0–34.0)
MCHC: 34.3 g/dL (ref 30.0–36.0)
MCV: 91.1 fL (ref 78.0–100.0)
MPV: 12.2 fL (ref 8.6–12.4)
Monocytes Absolute: 0.5 10*3/uL (ref 0.1–1.0)
Monocytes Relative: 10 % (ref 3–12)
NEUTROS PCT: 59 % (ref 43–77)
Neutro Abs: 3.1 10*3/uL (ref 1.7–7.7)
PLATELETS: 167 10*3/uL (ref 150–400)
RBC: 5.53 MIL/uL (ref 4.22–5.81)
RDW: 14 % (ref 11.5–15.5)
WBC: 5.2 10*3/uL (ref 4.0–10.5)

## 2015-01-21 LAB — URIC ACID: Uric Acid, Serum: 6.6 mg/dL (ref 4.0–7.8)

## 2015-01-21 LAB — BASIC METABOLIC PANEL WITH GFR
BUN: 18 mg/dL (ref 6–23)
CALCIUM: 9.5 mg/dL (ref 8.4–10.5)
CO2: 26 mEq/L (ref 19–32)
Chloride: 103 mEq/L (ref 96–112)
Creat: 1.03 mg/dL (ref 0.50–1.35)
GFR, EST NON AFRICAN AMERICAN: 78 mL/min
GFR, Est African American: >89 mL/min
Glucose, Bld: 73 mg/dL (ref 70–99)
POTASSIUM: 4.8 meq/L (ref 3.5–5.3)
Sodium: 141 mEq/L (ref 135–145)

## 2015-01-21 LAB — MAGNESIUM: Magnesium: 2.1 mg/dL (ref 1.5–2.5)

## 2015-01-21 LAB — LIPID PANEL
CHOL/HDL RATIO: 3 ratio
CHOLESTEROL: 174 mg/dL (ref 0–200)
HDL: 58 mg/dL (ref >=40)
LDL Cholesterol: 66 mg/dL (ref 0–99)
Triglycerides: 248 mg/dL — ABNORMAL HIGH (ref <150)
VLDL: 50 mg/dL — ABNORMAL HIGH (ref 0–40)

## 2015-01-21 NOTE — Patient Instructions (Signed)

## 2015-01-21 NOTE — Progress Notes (Signed)
Patient ID: Cory Carrillo, male   DOB: 15-Oct-1950, 64 y.o.   MRN: [id number]   Annual Comprehensive Examination  This very nice 64 y.o. MWM presents for complete physical.  Patient has been followed for HTN, Prediabetes, Hyperlipidemia, and Vitamin D Deficiency.   HTN predates since 64. Patient's BP has been controlled at home.Today's BP: (!) 156/84 mmHg. Patient denies any cardiac symptoms as chest pain, palpitations, shortness of breath, dizziness or ankle swelling.   Patient's hyperlipidemia is controlled with diet and medications. Patient denies myalgias or other medication SE's. Last lipids were at goal -  Chol 174; HDL 58; LDL  66; with elevated Triglycerides 248 on  01/21/2015.     Patient has prediabetes since Feb 2012 with an A1c 5.7% and patient denies reactive hypoglycemic symptoms, visual blurring, diabetic polys or paresthesias. Last A1c was 5.2% on 01/21/2015.       Finally, patient has history of Vitamin D Deficiency of 14 in 2008 and current vitamin D is 60.      Medication Sig  . aspirin 81 MG tablet Take 81 mg by mouth daily.  Marland Kitchen atenolol  50 MG tablet TAKE 1 TABLET EVERY DAY FOR BLOOD PRESSURE  . atorvastatin  80 MG tablet TAKE 1 TABLET EVERY DAY FOR CHOLESTEROL  . VITAMIN D  Take 2,000 Units by mouth 2 (two) times daily.  . citalopram  40 MG tablet TAKE 1 TABLET BY MOUTH ONCE DAILY FOR MOOD  . finasteride  5 MG tablet TAKE 1 TABLET EVERY DAY  . latanoprost (XALATAN) 0.005 % ophthalmic solution Place 1 drop into both eyes at bedtime.  . meloxicam15 MG tablet Take once daily as needed daily with food for pain, do not take aleve/ibuprofen with it, can take tylenol  . Multiple Vitamins-Minerals  Take by mouth daily.  . tadalafil (CIALIS) 20 MG  Take 1/2 to 1 tablet every 2 to 3 days if needed for XXXX  . Testosterone  250 MG/ML SOLN Inject 2 mLs into the muscle every 14 (fourteen) days.  Marland Kitchen VIAGRA 100 MG tablet prn   Allergies  Allergen Reactions  . Ace Inhibitors     cough    Past Medical History  Diagnosis Date  . Hypertension   . Hyperlipidemia   . GERD (gastroesophageal reflux disease)   . Glaucoma   . Prediabetes   . Gout   . Anxiety   . Depression   . Cervical stenosis of spine   . Hypogonadism male   . Vitamin D deficiency    Health Maintenance  Topic Date Due  . COLONOSCOPY  02/11/2003  . ZOSTAVAX  02/10/2013  . INFLUENZA VACCINE  03/01/2015  . TETANUS/TDAP  08/19/2022  . HIV Screening  Completed   Immunization History  Administered Date(s) Administered  . PPD Test 01/02/2014, 01/21/2015  . Pneumococcal-Unspecified 08/23/1992  . Tdap 08/19/2012   History reviewed. No pertinent past surgical history. Family History  Problem Relation Age of Onset  . Cancer Mother     colon  . Heart disease Father   . Heart disease Brother   . Hypertension Brother    History   Social History  . Marital Status: Married    Spouse Name: N/A  . Number of Children: N/A  . Years of Education: N/A   Occupational History  . Not on file.   Social History Main Topics  . Smoking status: Former Smoker -- 1.50 packs/day    Quit date: 08/23/2006  . Smokeless tobacco: Never Used  .  Alcohol Use: 4.2 oz/week    7 Standard drinks or equivalent per week  . Drug Use: No  . Sexual Activity: Not on file    ROS Constitutional: Denies fever, chills, weight loss/gain, headaches, insomnia,  night sweats or change in appetite. Does c/o fatigue. Eyes: Denies redness, blurred vision, diplopia, discharge, itchy or watery eyes.  ENT: Denies discharge, congestion, post nasal drip, epistaxis, sore throat, earache, hearing loss, dental pain, Tinnitus, Vertigo, Sinus pain or snoring.  Cardio: Denies chest pain, palpitations, irregular heartbeat, syncope, dyspnea, diaphoresis, orthopnea, PND, claudication or edema Respiratory: denies cough, dyspnea, DOE, pleurisy, hoarseness, laryngitis or wheezing.  Gastrointestinal: Denies dysphagia, heartburn, reflux, water brash,  pain, cramps, nausea, vomiting, bloating, diarrhea, constipation, hematemesis, melena, hematochezia, jaundice or hemorrhoids Genitourinary: Denies dysuria, frequency, urgency, nocturia, hesitancy, discharge, hematuria or flank pain Musculoskeletal: Denies arthralgia, myalgia, stiffness, Jt. Swelling, pain, limp or strain/sprain. Denies Falls. Skin: Denies puritis, rash, hives, warts, acne, eczema or change in skin lesion Neuro: No weakness, tremor, incoordination, spasms, paresthesia or pain Psychiatric: Denies confusion, memory loss or sensory loss. Denies Depression. Endocrine: Denies change in weight, skin, hair change, nocturia, and paresthesia, diabetic polys, visual blurring or hyper / hypo glycemic episodes.  Heme/Lymph: No excessive bleeding, bruising or enlarged lymph nodes.  Physical Exam  BP 156/84  Pulse 60  Temp 96.9 F  Resp 16  Ht 6' 1.75"   Wt 212 lb 12.8 oz    BMI 27.51  General Appearance: Well nourished, in no apparent distress. Eyes: PERRLA, EOMs, conjunctiva no swelling or erythema, normal fundi and vessels. Sinuses: No frontal/maxillary tenderness ENT/Mouth: EACs patent / TMs  nl. Nares clear without erythema, swelling, mucoid exudates. Oral hygiene is good. No erythema, swelling, or exudate. Tongue normal, non-obstructing. Tonsils not swollen or erythematous. Hearing normal.  Neck: Supple, thyroid normal. No bruits, nodes or JVD. Respiratory: Respiratory effort normal.  BS equal and clear bilateral without rales, rhonci, wheezing or stridor. Cardio: Heart sounds are normal with regular rate and rhythm and no murmurs, rubs or gallops. Peripheral pulses are normal and equal bilaterally without edema. No aortic or femoral bruits. Chest: symmetric with normal excursions and percussion.  Abdomen: Flat, soft, with bowel sounds. Nontender, no guarding, rebound, hernias, masses, or organomegaly.  Lymphatics: Non tender without lymphadenopathy.  Genitourinary: No  hernias.Testes nl. DRE - prostate nl for age - smooth & firm w/o nodules. Musculoskeletal: Full ROM all peripheral extremities, joint stability, 5/5 strength, and normal gait. Skin: Warm and dry without rashes, lesions, cyanosis, clubbing or  ecchymosis.  Neuro: Cranial nerves intact, reflexes equal bilaterally. Normal muscle tone, no cerebellar symptoms. Sensation intact.  Pysch: Awake and oriented X 3 with normal affect, insight and judgment appropriate.   Assessment and Plan  1. Essential hypertension  - Microalbumin / creatinine urine ratio - EKG 12-Lead - [place], RETROPERITNL ABD,  LTD - TSH  2. Hyperlipidemia  - Lipid panel  3. Prediabetes  - Hemoglobin A1c - Insulin, random  4. Vitamin D deficiency  - Vit D  25 hydroxy   5. Testosterone Deficiency  - Testosterone  6. Idiopathic gout  - Uric acid  7. Gastroesophageal reflux disease   8. Screening for rectal cancer  - POC Hemoccult Bld/Stl   9. Prostate cancer screening  - PSA  10. Other fatigue  - Vitamin B12 - Iron and TIBC  11. Medication management  - Urine Microscopic - CBC with Differential/Platelet - BASIC METABOLIC PANEL WITH GFR - Hepatic function panel - Magnesium  Continue prudent diet as discussed, weight control, BP monitoring, regular exercise, and medications as discussed.  Discussed med effects and SE's. Routine screening labs and tests as requested with regular follow-up as recommended.  Over 40 minutes of exam, counseling &  chart review was performed

## 2015-01-22 ENCOUNTER — Other Ambulatory Visit: Payer: Self-pay | Admitting: Internal Medicine

## 2015-01-22 ENCOUNTER — Encounter: Payer: Self-pay | Admitting: Internal Medicine

## 2015-01-22 DIAGNOSIS — I10 Essential (primary) hypertension: Secondary | ICD-10-CM

## 2015-01-22 LAB — TESTOSTERONE: TESTOSTERONE: 169 ng/dL — AB (ref 300–890)

## 2015-01-22 LAB — VITAMIN B12: Vitamin B-12: 679 pg/mL (ref 211–911)

## 2015-01-22 LAB — MICROALBUMIN / CREATININE URINE RATIO
Creatinine, Urine: 35.3 mg/dL
Microalb Creat Ratio: 8.5 mg/g (ref 0.0–30.0)
Microalb, Ur: 0.3 mg/dL (ref <2.0)

## 2015-01-22 LAB — HEMOGLOBIN A1C
Hgb A1c MFr Bld: 5.2 % (ref <5.7)
Mean Plasma Glucose: 103 mg/dL (ref <117)

## 2015-01-22 LAB — TSH: TSH: 1.027 u[IU]/mL (ref 0.350–4.500)

## 2015-01-22 LAB — URINALYSIS, MICROSCOPIC ONLY
Bacteria, UA: NONE SEEN
CRYSTALS: NONE SEEN
Casts: NONE SEEN
SQUAMOUS EPITHELIAL / LPF: NONE SEEN

## 2015-01-22 LAB — VITAMIN D 25 HYDROXY (VIT D DEFICIENCY, FRACTURES): VIT D 25 HYDROXY: 60 ng/mL (ref 30–100)

## 2015-01-22 LAB — INSULIN, RANDOM: Insulin: 9.5 u[IU]/mL (ref 2.0–19.6)

## 2015-01-22 LAB — PSA: PSA: 1.06 ng/mL (ref <=4.00)

## 2015-01-22 MED ORDER — LOSARTAN POTASSIUM 100 MG PO TABS: ORAL_TABLET | ORAL | Status: DC

## 2015-01-24 ENCOUNTER — Encounter: Payer: Self-pay | Admitting: Internal Medicine

## 2015-01-25 ENCOUNTER — Encounter: Payer: Self-pay | Admitting: Internal Medicine

## 2015-01-25 ENCOUNTER — Other Ambulatory Visit: Payer: Self-pay | Admitting: Internal Medicine

## 2015-01-25 DIAGNOSIS — E349 Endocrine disorder, unspecified: Secondary | ICD-10-CM

## 2015-01-25 MED ORDER — TESTOSTERONE CYPIONATE 250 MG/ML IM SOLN: 2.0000 mL | INTRAMUSCULAR | Status: DC

## 2015-01-26 ENCOUNTER — Other Ambulatory Visit: Payer: Self-pay

## 2015-01-26 DIAGNOSIS — E349 Endocrine disorder, unspecified: Secondary | ICD-10-CM

## 2015-01-26 MED ORDER — TESTOSTERONE CYPIONATE 250 MG/ML IM SOLN: 2.0000 mL | INTRAMUSCULAR | Status: DC

## 2015-01-26 MED ORDER — TESTOSTERONE CYPIONATE 200 MG/ML IM SOLN: 400.0000 mg | INTRAMUSCULAR | Status: DC

## 2015-03-12 ENCOUNTER — Other Ambulatory Visit: Payer: Self-pay | Admitting: Internal Medicine

## 2015-03-16 ENCOUNTER — Telehealth: Payer: Self-pay | Admitting: *Deleted

## 2015-03-16 NOTE — Telephone Encounter (Signed)
Left a message to remind patient to go for his chest x-ray, per Dr Oneta Rack.

## 2015-03-18 ENCOUNTER — Encounter: Payer: Self-pay | Admitting: Internal Medicine

## 2015-03-18 ENCOUNTER — Other Ambulatory Visit: Payer: Self-pay | Admitting: Internal Medicine

## 2015-03-23 ENCOUNTER — Other Ambulatory Visit: Payer: Self-pay | Admitting: Physician Assistant

## 2015-04-29 ENCOUNTER — Ambulatory Visit: Payer: Self-pay | Admitting: Internal Medicine

## 2015-07-20 ENCOUNTER — Other Ambulatory Visit: Payer: Self-pay | Admitting: Internal Medicine

## 2015-08-11 ENCOUNTER — Ambulatory Visit (INDEPENDENT_AMBULATORY_CARE_PROVIDER_SITE_OTHER): Payer: BLUE CROSS/BLUE SHIELD | Admitting: Physician Assistant

## 2015-08-11 ENCOUNTER — Encounter: Payer: Self-pay | Admitting: Internal Medicine

## 2015-08-11 VITALS — BP 126/82 | HR 64 | Temp 97.5°F | Resp 16 | Ht 73.75 in | Wt 217.7 lb

## 2015-08-11 DIAGNOSIS — E291 Testicular hypofunction: Secondary | ICD-10-CM

## 2015-08-11 DIAGNOSIS — I10 Essential (primary) hypertension: Secondary | ICD-10-CM

## 2015-08-11 DIAGNOSIS — R7303 Prediabetes: Secondary | ICD-10-CM | POA: Diagnosis not present

## 2015-08-11 DIAGNOSIS — Z79899 Other long term (current) drug therapy: Secondary | ICD-10-CM | POA: Diagnosis not present

## 2015-08-11 DIAGNOSIS — E559 Vitamin D deficiency, unspecified: Secondary | ICD-10-CM

## 2015-08-11 DIAGNOSIS — E785 Hyperlipidemia, unspecified: Secondary | ICD-10-CM | POA: Diagnosis not present

## 2015-08-11 LAB — CBC WITH DIFFERENTIAL/PLATELET
BASOS PCT: 1 % (ref 0–1)
Basophils Absolute: 0.1 10*3/uL (ref 0.0–0.1)
EOS PCT: 1 % (ref 0–5)
Eosinophils Absolute: 0.1 10*3/uL (ref 0.0–0.7)
HCT: 46.9 % (ref 39.0–52.0)
Hemoglobin: 15.5 g/dL (ref 13.0–17.0)
Lymphocytes Relative: 27 % (ref 12–46)
Lymphs Abs: 1.8 10*3/uL (ref 0.7–4.0)
MCH: 30.9 pg (ref 26.0–34.0)
MCHC: 33 g/dL (ref 30.0–36.0)
MCV: 93.4 fL (ref 78.0–100.0)
MONO ABS: 0.6 10*3/uL (ref 0.1–1.0)
MPV: 12.5 fL — ABNORMAL HIGH (ref 8.6–12.4)
Monocytes Relative: 9 % (ref 3–12)
Neutro Abs: 4.1 10*3/uL (ref 1.7–7.7)
Neutrophils Relative %: 62 % (ref 43–77)
Platelets: 161 10*3/uL (ref 150–400)
RBC: 5.02 MIL/uL (ref 4.22–5.81)
RDW: 14.4 % (ref 11.5–15.5)
WBC: 6.6 10*3/uL (ref 4.0–10.5)

## 2015-08-11 LAB — LIPID PANEL
Cholesterol: 159 mg/dL (ref 125–200)
HDL: 57 mg/dL (ref >=40)
LDL CALC: 76 mg/dL (ref <130)
TRIGLYCERIDES: 128 mg/dL (ref <150)
Total CHOL/HDL Ratio: 2.8 Ratio (ref <=5.0)
VLDL: 26 mg/dL (ref <30)

## 2015-08-11 LAB — BASIC METABOLIC PANEL WITH GFR
BUN: 18 mg/dL (ref 7–25)
CALCIUM: 9.4 mg/dL (ref 8.6–10.3)
CO2: 28 mmol/L (ref 20–31)
CREATININE: 1 mg/dL (ref 0.70–1.25)
Chloride: 101 mmol/L (ref 98–110)
GFR, EST NON AFRICAN AMERICAN: 80 mL/min (ref >=60)
GFR, Est African American: >89 mL/min (ref >=60)
Glucose, Bld: 81 mg/dL (ref 65–99)
Potassium: 4.1 mmol/L (ref 3.5–5.3)
SODIUM: 136 mmol/L (ref 135–146)

## 2015-08-11 LAB — TSH: TSH: 1.61 u[IU]/mL (ref 0.350–4.500)

## 2015-08-11 LAB — HEPATIC FUNCTION PANEL
ALBUMIN: 4.6 g/dL (ref 3.6–5.1)
ALK PHOS: 36 U/L — AB (ref 40–115)
ALT: 26 U/L (ref 9–46)
AST: 21 U/L (ref 10–35)
BILIRUBIN INDIRECT: 0.3 mg/dL (ref 0.2–1.2)
Bilirubin, Direct: 0.1 mg/dL (ref <=0.2)
TOTAL PROTEIN: 6.4 g/dL (ref 6.1–8.1)
Total Bilirubin: 0.4 mg/dL (ref 0.2–1.2)

## 2015-08-11 LAB — MAGNESIUM: MAGNESIUM: 1.9 mg/dL (ref 1.5–2.5)

## 2015-08-11 MED ORDER — TADALAFIL 20 MG PO TABS: 20.0000 mg | ORAL_TABLET | Freq: Every day | ORAL | Status: DC | PRN

## 2015-08-11 NOTE — Progress Notes (Signed)
Assessment and Plan:  1. Hypertension -Continue medication, monitor blood pressure at home. Continue DASH diet.  Reminder to go to the ER if any CP, SOB, nausea, dizziness, severe HA, changes vision/speech, left arm numbness and tingling and jaw pain.  2. Cholesterol -Continue diet and exercise. Check cholesterol.   3. Prediabetes  -Continue diet and exercise. Check A1C  4. Vitamin D Def - check level and continue medications.   5. Hypogonadism - continue to monitor, states medication is helping with symptoms of low T Cialis for ED.    Continue diet and meds as discussed. Further disposition pending results of labs.  HPI 65 y.o. male  presents for 3 month follow up   has a past medical history of Hypertension; Hyperlipidemia; GERD (gastroesophageal reflux disease); Glaucoma; Prediabetes; Gout; Anxiety; Depression; Cervical stenosis of spine; Hypogonadism male; and Vitamin D deficiency.  His blood pressure has been controlled at home, he is on atenolol 50mg  takes 1/2, last visit cozaar 100mg  1/2 pill added and his BP has been doing better, today their BP is BP: 126/82 mmHg  He does not workout, and no regular exercise but with the warmer weather he will get more exercise. He denies chest pain, shortness of breath, dizziness.  He is on cholesterol medication, lipitor 80 and take 20mg  M,W,F and denies myalgias. His cholesterol is at goal. The cholesterol last visit was:   Lab Results  Component Value Date   CHOL 174 01/21/2015   HDL 58 01/21/2015   LDLCALC 66 01/21/2015   TRIG 248* 01/21/2015   CHOLHDL 3.0 01/21/2015    He has been working on diet and exercise for prediabetes, and denies paresthesia of the feet, polydipsia, polyuria and visual disturbances. Last A1C in the office was:  Lab Results  Component Value Date   HGBA1C 5.2 01/21/2015  Patient is on Vitamin D supplement.   Lab Results  Component Value Date   VD25OH 87 01/21/2015  He has a history of testosterone  deficiency and is on testosterone replacement, due this Sunday, does 2 cc. He states that the testosterone helps with his energy, libido, muscle mass. Lab Results  Component Value Date   TESTOSTERONE 169* 01/21/2015      Current Medications:  Current Outpatient Prescriptions on File Prior to Visit  Medication Sig Dispense Refill  . aspirin 81 MG tablet Take 81 mg by mouth daily.    Marland Kitchen atenolol (TENORMIN) 50 MG tablet TAKE 1 TABLET EVERY DAY FOR BLOOD PRESSURE 90 tablet 1  . atorvastatin (LIPITOR) 80 MG tablet TAKE 1 TABLET EVERY DAY FOR CHOLESTEROL 90 tablet 3  . Cholecalciferol (VITAMIN D PO) Take 2,000 Units by mouth 2 (two) times daily.    Marland Kitchen CIALIS 20 MG tablet TAKE 1/2 TO 1 TABLET EVERY 2 TO 3 DAYS IF NEEDED 12 tablet 8  . citalopram (CELEXA) 40 MG tablet TAKE 1 TABLET BY MOUTH ONCE DAILY FOR MOOD 90 tablet 1  . finasteride (PROSCAR) 5 MG tablet TAKE 1 TABLET EVERY DAY 90 tablet 99  . latanoprost (XALATAN) 0.005 % ophthalmic solution Place 1 drop into both eyes at bedtime.    Marland Kitchen losartan (COZAAR) 100 MG tablet Take 1/2 to 1 tablet daily for BP 90 tablet 99  . meloxicam (MOBIC) 15 MG tablet Take once daily as needed daily with food for pain, do not take aleve/ibuprofen with it, can take tylenol 90 tablet 0  . Multiple Vitamins-Minerals (MULTIVITAMIN PO) Take by mouth daily.    Marland Kitchen testosterone cypionate (DEPOTESTOSTERONE CYPIONATE)  200 MG/ML injection Inject 2 mLs (400 mg total) into the muscle every 14 (fourteen) days. 10 mL 2   No current facility-administered medications on file prior to visit.   Medical History:  Past Medical History  Diagnosis Date  . Hypertension   . Hyperlipidemia   . GERD (gastroesophageal reflux disease)   . Glaucoma   . Prediabetes   . Gout   . Anxiety   . Depression   . Cervical stenosis of spine   . Hypogonadism male   . Vitamin D deficiency    Allergies:  Allergies  Allergen Reactions  . Ace Inhibitors     cough     Review of Systems:   Review of Systems  Constitutional: Negative.   HENT: Negative.   Eyes: Negative.   Respiratory: Negative.   Cardiovascular: Negative.   Gastrointestinal: Negative.   Genitourinary: Negative.   Musculoskeletal: Positive for back pain (lower back pain, had ESI 1 year ago). Negative for myalgias, joint pain, falls and neck pain.  Skin: Negative.   Neurological: Negative.   Endo/Heme/Allergies: Negative.   Psychiatric/Behavioral: Negative.     Family history- Review and unchanged Social history- Review and unchanged Physical Exam: BP 126/82 mmHg  Pulse 64  Temp(Src) 97.5 F (36.4 C)  Resp 16  Ht 6' 1.75" (1.873 m)  Wt 217 lb 11.2 oz (98.748 kg)  BMI 28.15 kg/m2 Wt Readings from Last 3 Encounters:  08/11/15 217 lb 11.2 oz (98.748 kg)  01/21/15 212 lb 12.8 oz (96.525 kg)  10/15/14 217 lb (98.431 kg)   General Appearance: Well nourished, in no apparent distress. Eyes: PERRLA, EOMs, conjunctiva no swelling or erythema Sinuses: No Frontal/maxillary tenderness ENT/Mouth: Ext aud canals clear, TMs without erythema, bulging. No erythema, swelling, or exudate on post pharynx.  Tonsils not swollen or erythematous. Hearing normal.  Neck: Supple, thyroid normal.  Respiratory: Respiratory effort normal, BS equal bilaterally without rales, rhonchi, wheezing or stridor.  Cardio: RRR with no MRGs. Brisk peripheral pulses without edema.  Abdomen: Soft, + BS,  Non tender, no guarding, rebound, hernias, masses. Lymphatics: Non tender without lymphadenopathy.  Musculoskeletal: Full ROM, 5/5 strength, Patient is able to ambulate well. Gait is not  Antalgic. Straight leg raising with dorsiflexion negative bilaterally for radicular symptoms. Sensory exam in the legs are normal. Knee reflexes are normal Ankle reflexes are normal Strength is normal and symmetric in arms and legs. There is not SI tenderness to palpation.  There isparaspinal muscle spasm.  There is not midline tenderness.  ROM of spine  with  limited in flexion due to pain.  Skin: Warm, dry without rashes, lesions, ecchymosis.  Neuro: Cranial nerves intact. Normal muscle tone, no cerebellar symptoms. Psych: Awake and oriented X 3, normal affect, Insight and Judgment appropriate.    Quentin MullingAmanda Collier, PA-C 5:08 PM [facility]

## 2015-08-11 NOTE — Patient Instructions (Signed)
Monitor your blood pressure at home. Go to the ER if any CP, SOB, nausea, dizziness, severe HA, changes vision/speech  Goal BP:  For patients younger than 60: Goal BP < 140/90. For patients 60 and older: Goal BP < 150/90. For patients with diabetes: Goal BP < 140/90. Your most recent BP: BP: 126/82 mmHg   Take your medications faithfully as instructed. Maintain a healthy weight. Get at least 150 minutes of aerobic exercise per week. Minimize salt intake. Minimize alcohol intake  DASH Eating Plan DASH stands for "Dietary Approaches to Stop Hypertension." The DASH eating plan is a healthy eating plan that has been shown to reduce high blood pressure (hypertension). Additional health benefits may include reducing the risk of type 2 diabetes mellitus, heart disease, and stroke. The DASH eating plan may also help with weight loss. WHAT DO I NEED TO KNOW ABOUT THE DASH EATING PLAN? For the DASH eating plan, you will follow these general guidelines:  Choose foods with a percent daily value for sodium of less than 5% (as listed on the food label).  Use salt-free seasonings or herbs instead of table salt or sea salt.  Check with your health care provider or pharmacist before using salt substitutes.  Eat lower-sodium products, often labeled as "lower sodium" or "no salt added."  Eat fresh foods.  Eat more vegetables, fruits, and low-fat dairy products.  Choose whole grains. Look for the word "whole" as the first word in the ingredient list.  Choose fish and skinless chicken or [place] more often than red meat. Limit fish, poultry, and meat to 6 oz (170 g) each day.  Limit sweets, desserts, sugars, and sugary drinks.  Choose heart-healthy fats.  Limit cheese to 1 oz (28 g) per day.  Eat more home-cooked food and less restaurant, buffet, and fast food.  Limit fried foods.  Cook foods using methods other than frying.  Limit canned vegetables. If you do use them, rinse them well to  decrease the sodium.  When eating at a restaurant, ask that your food be prepared with less salt, or no salt if possible. WHAT FOODS CAN I EAT? Seek help from a dietitian for individual calorie needs. Grains Whole grain or whole wheat bread. Brown rice. Whole grain or whole wheat pasta. Quinoa, bulgur, and whole grain cereals. Low-sodium cereals. Corn or whole wheat flour tortillas. Whole grain cornbread. Whole grain crackers. Low-sodium crackers. Vegetables Fresh or frozen vegetables (raw, steamed, roasted, or grilled). Low-sodium or reduced-sodium tomato and vegetable juices. Low-sodium or reduced-sodium tomato sauce and paste. Low-sodium or reduced-sodium canned vegetables.  Fruits All fresh, canned (in natural juice), or frozen fruits. Meat and Other Protein Products Ground beef (85% or leaner), grass-fed beef, or beef trimmed of fat. Skinless chicken or [place]. Ground chicken or [place]. Pork trimmed of fat. All fish and seafood. Eggs. Dried beans, peas, or lentils. Unsalted nuts and seeds. Unsalted canned beans. Dairy Low-fat dairy products, such as skim or 1% milk, 2% or reduced-fat cheeses, low-fat ricotta or cottage cheese, or plain low-fat yogurt. Low-sodium or reduced-sodium cheeses. Fats and Oils Tub margarines without trans fats. Light or reduced-fat mayonnaise and salad dressings (reduced sodium). Avocado. Safflower, olive, or canola oils. Natural peanut or almond butter. Other Unsalted popcorn and pretzels. The items listed above may not be a complete list of recommended foods or beverages. Contact your dietitian for more options. WHAT FOODS ARE NOT RECOMMENDED? Grains White bread. White pasta. White rice. Refined cornbread. Bagels and croissants. Crackers that contain  trans fat. Vegetables Creamed or fried vegetables. Vegetables in a cheese sauce. Regular canned vegetables. Regular canned tomato sauce and paste. Regular tomato and vegetable juices. Fruits Dried fruits. Canned  fruit in light or heavy syrup. Fruit juice. Meat and Other Protein Products Fatty cuts of meat. Ribs, chicken wings, bacon, sausage, bologna, salami, chitterlings, fatback, hot dogs, bratwurst, and packaged luncheon meats. Salted nuts and seeds. Canned beans with salt. Dairy Whole or 2% milk, cream, half-and-half, and cream cheese. Whole-fat or sweetened yogurt. Full-fat cheeses or blue cheese. Nondairy creamers and whipped toppings. Processed cheese, cheese spreads, or cheese curds. Condiments Onion and garlic salt, seasoned salt, table salt, and sea salt. Canned and packaged gravies. Worcestershire sauce. Tartar sauce. Barbecue sauce. Teriyaki sauce. Soy sauce, including reduced sodium. Steak sauce. Fish sauce. Oyster sauce. Cocktail sauce. Horseradish. Ketchup and mustard. Meat flavorings and tenderizers. Bouillon cubes. Hot sauce. Tabasco sauce. Marinades. Taco seasonings. Relishes. Fats and Oils Butter, stick margarine, lard, shortening, ghee, and bacon fat. Coconut, palm kernel, or palm oils. Regular salad dressings. Other Pickles and olives. Salted popcorn and pretzels. The items listed above may not be a complete list of foods and beverages to avoid. Contact your dietitian for more information. WHERE CAN I FIND MORE INFORMATION? National Heart, Lung, and [employer] Institute: www.nhlbi.nih.gov/health/health-topics/topics/dash/ Document Released: 07/06/2011 Document Revised: 12/01/2013 Document Reviewed: 05/21/2013 [employer] Patient Information 2015 [employer]. This information is not intended to replace advice given to you by your health care provider. Make sure you discuss any questions you have with your health care provider.  

## 2015-08-12 LAB — HEMOGLOBIN A1C
HEMOGLOBIN A1C: 5 % (ref <5.7)
Mean Plasma Glucose: 97 mg/dL (ref <117)

## 2015-08-12 LAB — VITAMIN D 25 HYDROXY (VIT D DEFICIENCY, FRACTURES): Vit D, 25-Hydroxy: 84 ng/mL (ref 30–100)

## 2015-08-12 LAB — INSULIN, FASTING: INSULIN FASTING, SERUM: 5.8 u[IU]/mL (ref 2.0–19.6)

## 2015-11-01 ENCOUNTER — Encounter: Payer: Self-pay | Admitting: Internal Medicine

## 2015-11-02 ENCOUNTER — Encounter: Payer: Self-pay | Admitting: Internal Medicine

## 2015-11-02 ENCOUNTER — Ambulatory Visit (INDEPENDENT_AMBULATORY_CARE_PROVIDER_SITE_OTHER): Payer: BLUE CROSS/BLUE SHIELD | Admitting: Internal Medicine

## 2015-11-02 VITALS — BP 128/78 | HR 60 | Temp 97.5°F | Resp 16 | Ht 73.75 in | Wt 213.4 lb

## 2015-11-02 DIAGNOSIS — Z79899 Other long term (current) drug therapy: Secondary | ICD-10-CM | POA: Diagnosis not present

## 2015-11-02 DIAGNOSIS — K219 Gastro-esophageal reflux disease without esophagitis: Secondary | ICD-10-CM

## 2015-11-02 DIAGNOSIS — I10 Essential (primary) hypertension: Secondary | ICD-10-CM | POA: Diagnosis not present

## 2015-11-02 DIAGNOSIS — E291 Testicular hypofunction: Secondary | ICD-10-CM

## 2015-11-02 DIAGNOSIS — E559 Vitamin D deficiency, unspecified: Secondary | ICD-10-CM

## 2015-11-02 DIAGNOSIS — R7303 Prediabetes: Secondary | ICD-10-CM

## 2015-11-02 DIAGNOSIS — E785 Hyperlipidemia, unspecified: Secondary | ICD-10-CM | POA: Diagnosis not present

## 2015-11-02 DIAGNOSIS — R590 Localized enlarged lymph nodes: Secondary | ICD-10-CM | POA: Insufficient documentation

## 2015-11-02 MED ORDER — PREDNISONE 20 MG PO TABS: ORAL_TABLET | ORAL | Status: DC

## 2015-11-02 NOTE — Progress Notes (Deleted)
Subjective:             HPI        Review of Systems        Objective:    Physical Exam        Assessment:             Plan:

## 2015-11-02 NOTE — Progress Notes (Signed)
Patient ID: Cory Carrillo, male   DOB: 07-Apr-1951, 65 y.o.   MRN: [id number]     This very nice 65 y.o. MWM presents for  follow up with Hypertension, Hyperlipidemia, Pre-Diabetes and Vitamin D Deficiency. He is also c/o swollen glands in his neck for 2 weeks w/o c/o fever, S/T, respiratory sx's, rashes, fevers, pruritis, anorexia or nite sweats.     Patient is treated for HTN circa 1994  & BP has been controlled at home. Today's BP: 128/78 mmHg. Patient has had no complaints of any cardiac type chest pain, palpitations, dyspnea/orthopnea/PND, dizziness, claudication, or dependent edema.     Hyperlipidemia is controlled with diet & meds. Patient denies myalgias or other med SE's. Last Lipids were Cholesterol 159; HDL 57; LDL 76; Triglycerides 128 on 08/11/2015.     Also, the patient has history of PreDiabetes with A1c 5.7% in 2012 nd has had no symptoms of reactive hypoglycemia, diabetic polys, paresthesias or visual blurring.  Last A1c was 5.0% on 08/11/2015.      Patient is also on replacement for Low T by injection therapy. Further, the patient also has history of Vitamin D Deficiency of "14" in 2008 and supplements vitamin D without any suspected side-effects. Last vitamin D was 84 on 08/11/2015.     Medication Sig  . aspirin 81 MG tablet Take 81 mg by mouth daily.  Marland Kitchen. atenolol 50 MG tablet TAKE 1 TABLET EVERY DAY FOR BLOOD PRESSURE  . atorvastatin  80 MG tablet TAKE 1 TABLET EVERY DAY FOR CHOLESTEROL  . VITAMIN D  Take 2,000 Units by mouth 4 (four) times daily.   . citalopram  40 MG tablet TAKE 1 TABLET BY MOUTH ONCE DAILY FOR MOOD  . finasteride  5 MG tablet TAKE 1 TABLET EVERY DAY  . XALATAN  ophth soln Place 1 drop into both eyes at bedtime.  Marland Kitchen. losartan 100 MG tablet Take 1/2 to 1 tablet daily for BP  . meloxicam  15 MG tablet Take once daily as needed daily with food  . Multiple Vitamins-Minerals  Take by mouth daily.  . tadalafil  20 MG tablet Take 1 tablet (20 mg total) by mouth daily as  needed for erectile dysfunction.  Marland Kitchen. testosterone cypionate 200 MG/ML inj Inject 2 mLs (400 mg total) into the muscle every 14 (fourteen) days.   Allergies  Allergen Reactions  . Ace Inhibitors     cough   PMHx:   Past Medical History  Diagnosis Date  . Hypertension   . Hyperlipidemia   . GERD (gastroesophageal reflux disease)   . Glaucoma   . Prediabetes   . Gout   . Anxiety   . Depression   . Cervical stenosis of spine   . Hypogonadism male   . Vitamin D deficiency    Immunization History  Administered Date(s) Administered  . PPD Test 01/02/2014, 01/21/2015  . Pneumococcal-Unspecified 08/23/1992  . Tdap 08/19/2012   No past surgical history on file. FHx:    Reviewed / unchanged  SHx:    Reviewed / unchanged  Systems Review:  Constitutional: Denies fever, chills, wt changes, headaches, insomnia, fatigue, night sweats, change in appetite. Eyes: Denies redness, blurred vision, diplopia, discharge, itchy, watery eyes.  ENT: Denies discharge, congestion, post nasal drip, epistaxis, sore throat, earache, hearing loss, dental pain, tinnitus, vertigo, sinus pain, snoring.  CV: Denies chest pain, palpitations, irregular heartbeat, syncope, dyspnea, diaphoresis, orthopnea, PND, claudication or edema. Respiratory: denies cough, dyspnea, DOE, pleurisy, hoarseness, laryngitis, wheezing.  Gastrointestinal: Denies dysphagia, odynophagia, heartburn, reflux, water brash, abdominal pain or cramps, nausea, vomiting, bloating, diarrhea, constipation, hematemesis, melena, hematochezia  or hemorrhoids. Genitourinary: Denies dysuria, frequency, urgency, nocturia, hesitancy, discharge, hematuria or flank pain. Musculoskeletal: Denies arthralgias, myalgias, stiffness, jt. swelling, pain, limping or strain/sprain.  Skin: Denies pruritus, rash, hives, warts, acne, eczema or change in skin lesion(s). Neuro: No weakness, tremor, incoordination, spasms, paresthesia or pain. Psychiatric: Denies  confusion, memory loss or sensory loss. Endo: Denies change in weight, skin or hair change.  Heme/Lymph: No excessive bleeding, bruising or enlarged lymph nodes.  Physical Exam  BP 128/78 mmHg  Pulse 60  Temp(Src) 97.5 F (36.4 C)  Resp 16  Ht 6' 1.75" (1.873 m)  Wt 213 lb 6.4 oz (96.798 kg)  BMI 27.59 kg/m2  Appears well nourished and in no distress. Eyes: PERRLA, EOMs, conjunctiva no swelling or erythema. Sinuses: No frontal/maxillary tenderness ENT/Mouth: EAC's clear, TM's nl w/o erythema, bulging. Nares clear w/o erythema, swelling, exudates. Oropharynx clear without erythema or exudates. Oral hygiene is good. Tongue normal, non obstructing. Hearing intact.  Neck: Supple. Thyroid nl. Car 2+/2+ without bruits or JVD. Does have Bilat soft mobile anterior ? 2-3 cm Cx LN's above the level of the larynx.  Chest: Respirations nl with BS clear & equal w/o rales, rhonchi, wheezing or stridor.  Cor: Heart sounds normal w/ regular rate and rhythm without sig. murmurs, gallops, clicks, or rubs. Peripheral pulses normal and equal  without edema.  Abdomen: Soft & bowel sounds normal. Non-tender w/o guarding, rebound, hernias, masses, or organomegaly.  Lymphatics: As above and ? Of a small mobile 1 cm R pre-auricular LN. No other palp LN's in the occiput, neck, axillae, epitrochlear areas or groins.  Musculoskeletal: Full ROM all peripheral extremities, joint stability, 5/5 strength, and normal gait.  Skin: Warm, dry without exposed rashes, lesions or ecchymosis apparent.  Neuro: Cranial nerves intact, reflexes equal bilaterally. Sensory-motor testing grossly intact. Tendon reflexes grossly intact.  Pysch: Alert & oriented x 3.  Insight and judgement nl & appropriate. No ideations.  Assessment and Plan:  1. Essential hypertension  - TSH  2. Hyperlipidemia  - Lipid panel - TSH  3. Prediabetes  - Hemoglobin A1c - Insulin, random  4. Vitamin D deficiency  - VITAMIN D 25 Hydroxy    5. Gastroesophageal reflux disease   6. Testosterone Deficiency  - Testosterone  7. Cervical lymphadenopathy  - DG Neck Soft Tissue; Future  8. Medication management  - CBC with Differential/Platelet - BASIC METABOLIC PANEL WITH GFR - Hepatic function panel - Magnesium   Recommended regular exercise, BP monitoring, weight control, and discussed med and SE's. Recommended labs to assess and monitor clinical status. Further disposition pending results of labs. Over 30 minutes of exam, counseling, chart review was performed

## 2015-11-02 NOTE — Patient Instructions (Signed)
Recommend Adult Low Dose Aspirin or   coated  Aspirin 81 mg daily   To reduce risk of Colon Cancer 20 %,   Skin Cancer 26 % ,   Melanoma 46%   and   Pancreatic cancer 60%   ++++++++++++++++++++++++++++++++++++++++++++++++++++++ Vitamin D goal   is between 70-100.   Please make sure that you are taking your Vitamin D as directed.   It is very important as a natural anti-inflammatory   helping hair, skin, and nails, as well as reducing stroke and heart attack risk.   It helps your bones and helps with mood.  It also decreases numerous cancer risks so please take it as directed.   Low Vit D is associated with a 200-300% higher risk for CANCER   and 200-300% higher risk for HEART   ATTACK  &  STROKE.   ......................................  It is also associated with higher death rate at younger ages,   autoimmune diseases like Rheumatoid arthritis, Lupus, Multiple Sclerosis.     Also many other serious conditions, like depression, Alzheimer's  Dementia, infertility, muscle aches, fatigue, fibromyalgia - just to name a few.  ++++++++++++++++++++++++++++++++++++++++++++++++  Recommend the book "The END of DIETING" by Dr Joel Fuhrman   & the book "The END of DIABETES " by Dr Joel Fuhrman  At [employer] - get book & Audio CD's     Being diabetic has a  300% increased risk for heart attack, stroke, cancer, and alzheimer- type vascular dementia. It is very important that you work harder with diet by avoiding all foods that are white. Avoid white rice (brown & wild rice is OK), white potatoes (sweetpotatoes in moderation is OK), White bread or wheat bread or anything made out of white flour like bagels, donuts, rolls, buns, biscuits, cakes, pastries, cookies, pizza crust, and pasta (made from white flour & egg whites) - vegetarian pasta or spinach or wheat pasta is OK. Multigrain breads like Arnold's or Pepperidge [employer], or multigrain sandwich thins or flatbreads.  Diet,  exercise and weight loss can reverse and cure diabetes in the early stages.  Diet, exercise and weight loss is very important in the control and prevention of complications of diabetes which affects every system in your body, ie. Brain - dementia/stroke, eyes - glaucoma/blindness, heart - heart attack/heart failure, kidneys - dialysis, stomach - gastric paralysis, intestines - malabsorption, nerves - severe painful neuritis, circulation - gangrene & loss of a leg(s), and finally cancer and Alzheimers.    I recommend avoid fried & greasy foods,  sweets/candy, white rice (brown or wild rice or Quinoa is OK), white potatoes (sweet potatoes are OK) - anything made from white flour - bagels, doughnuts, rolls, buns, biscuits,white and wheat breads, pizza crust and traditional pasta made of white flour & egg white(vegetarian pasta or spinach or wheat pasta is OK).  Multi-grain bread is OK - like multi-grain flat bread or sandwich thins. Avoid alcohol in excess. Exercise is also important.    Eat all the vegetables you want - avoid meat, especially red meat and dairy - especially cheese.  Cheese is the most concentrated form of trans-fats which is the worst thing to clog up our arteries. Veggie cheese is OK which can be found in the fresh produce section at [employer] or [employer] or [employer]  ++++++++++++++++++++++++++++++++++++++++++++++++++ DASH Eating Plan  DASH stands for "Dietary Approaches to Stop Hypertension."   The DASH eating plan is a healthy eating plan that has been shown to reduce high blood   pressure (hypertension). Additional health benefits may include reducing the risk of type 2 diabetes mellitus, heart disease, and stroke. The DASH eating plan may also help with weight loss.  WHAT DO I NEED TO KNOW ABOUT THE DASH EATING PLAN?  For the DASH eating plan, you will follow these general guidelines:  Choose foods with a percent daily value for sodium of less than 5% (as listed on the food  label).  Use salt-free seasonings or herbs instead of table salt or sea salt.  Check with your health care provider or pharmacist before using salt substitutes.  Eat lower-sodium products, often labeled as "lower sodium" or "no salt added."  Eat fresh foods.  Eat more vegetables, fruits, and low-fat dairy products.    Choose whole grains. Look for the word "whole" as the first word in the ingredient list.  Choose fish   Limit sweets, desserts, sugars, and sugary drinks.  Choose heart-healthy fats.  Eat veggie cheese   Eat more home-cooked food and less restaurant, buffet, and fast food.  Limit fried foods.  Cook foods using methods other than frying.  Limit canned vegetables. If you do use them, rinse them well to decrease the sodium.  When eating at a restaurant, ask that your food be prepared with less salt, or no salt if possible.                      WHAT FOODS CAN I EAT?  Read Dr Joel Fuhrman's books on The End of Dieting & The End of Diabetes  Grains  Whole grain or whole wheat bread. Brown rice. Whole grain or whole wheat pasta. Quinoa, bulgur, and whole grain cereals. Low-sodium cereals. Corn or whole wheat flour tortillas. Whole grain cornbread. Whole grain crackers. Low-sodium crackers.  Vegetables  Fresh or frozen vegetables (raw, steamed, roasted, or grilled). Low-sodium or reduced-sodium tomato and vegetable juices. Low-sodium or reduced-sodium tomato sauce and paste. Low-sodium or reduced-sodium canned vegetables.   Fruits  All fresh, canned (in natural juice), or frozen fruits.  Protein Products   All fish and seafood.  Dried beans, peas, or lentils. Unsalted nuts and seeds. Unsalted canned beans.  Dairy  Low-fat dairy products, such as skim or 1% milk, 2% or reduced-fat cheeses, low-fat ricotta or cottage cheese, or plain low-fat yogurt. Low-sodium or reduced-sodium cheeses.  Fats and Oils  Tub margarines without trans fats. Light or  reduced-fat mayonnaise and salad dressings (reduced sodium). Avocado. Safflower, olive, or canola oils. Natural peanut or almond butter.  Other  Unsalted popcorn and pretzels. The items listed above may not be a complete list of recommended foods or beverages. Contact your dietitian for more options.  +++++++++++++++++++++++++++++++++++++++++++  WHAT FOODS ARE NOT RECOMMENDED?  Grains/ White flour or wheat flour  White bread. White pasta. White rice. Refined cornbread. Bagels and croissants. Crackers that contain trans fat.  Vegetables  Creamed or fried vegetables. Vegetables in a . Regular canned vegetables. Regular canned tomato sauce and paste. Regular tomato and vegetable juices.  Fruits  Dried fruits. Canned fruit in light or heavy syrup. Fruit juice.  Meat and Other Protein Products  Meat in general - RED mwaet & White meat.  Fatty cuts of meat. Ribs, chicken wings, bacon, sausage, bologna, salami, chitterlings, fatback, hot dogs, bratwurst, and packaged luncheon meats.  Dairy  Whole or 2% milk, cream, half-and-half, and cream cheese. Whole-fat or sweetened yogurt. Full-fat cheeses or blue cheese. Nondairy creamers and whipped toppings. Processed cheese, cheese spreads, or   cheese curds.  Condiments  Onion and garlic salt, seasoned salt, table salt, and sea salt. Canned and packaged gravies. Worcestershire sauce. Tartar sauce. Barbecue sauce. Teriyaki sauce. Soy sauce, including reduced sodium. Steak sauce. Fish sauce. Oyster sauce. Cocktail sauce. Horseradish. Ketchup and mustard. Meat flavorings and tenderizers. Bouillon cubes. Hot sauce. Tabasco sauce. Marinades. Taco seasonings. Relishes.  Fats and Oils Butter, stick margarine, lard, shortening and bacon fat. Coconut, palm kernel, or palm oils. Regular salad dressings.  Pickles and olives. Salted popcorn and pretzels.  The items listed above may not be a complete list of foods and beverages to  avoid.   Lymphadenopathy Lymphadenopathy refers to swollen or enlarged lymph glands, also called lymph nodes. Lymph glands are part of your body's defense (immune) system, which protects the body from infections, germs, and diseases. Lymph glands are found in many locations in your body, including the neck, underarm, and groin.  Many things can cause lymph glands to become enlarged. When your immune system responds to germs, such as viruses or bacteria, infection-fighting cells and fluid build up. This causes the glands to grow in size. Usually, this is not something to worry about. The swelling and any soreness often go away without treatment. However, swollen lymph glands can also be caused by a number of diseases. Your health care provider may do various tests to help determine the cause. If the cause of your swollen lymph glands cannot be found, it is important to monitor your condition to make sure the swelling goes away. HOME CARE INSTRUCTIONS Watch your condition for any changes. The following actions may help to lessen any discomfort you are feeling:  Get plenty of rest.  Take medicines only as directed by your health care provider. Your health care provider may recommend over-the-counter medicines for pain.  Apply moist heat compresses to the site of swollen lymph nodes as directed by your health care provider. This can help reduce any pain.  Check your lymph nodes daily for any changes.  Keep all follow-up visits as directed by your health care provider. This is important. SEEK MEDICAL CARE IF:  Your lymph nodes are still swollen after 2 weeks.  Your swelling increases or spreads to other areas.  Your lymph nodes are hard, seem fixed to the skin, or are growing rapidly.  Your skin over the lymph nodes is red and inflamed.  You have a fever.  You have chills.  You have fatigue.  You develop a sore throat.  You have abdominal pain.  You have weight loss.  You have  night sweats. SEEK IMMEDIATE MEDICAL CARE IF:  You notice fluid leaking from the area of the enlarged lymph node.  You have severe pain in any area of your body.  You have chest pain.  You have shortness of breath.   This information is not intended to replace advice given to you by your health care provider. Make sure you discuss any questions you have with your health care provider.   Document Released: 04/25/2008 Document Revised: 08/07/2014 Document Reviewed: 02/19/2014 [employer] Interactive Patient Education [employer]

## 2015-11-03 ENCOUNTER — Ambulatory Visit (HOSPITAL_COMMUNITY)
Admission: RE | Admit: 2015-11-03 | Discharge: 2015-11-03 | Disposition: A | Discharge status: Home / Self Care | Payer: BLUE CROSS/BLUE SHIELD | Source: Ambulatory Visit | Attending: Internal Medicine | Admitting: Internal Medicine

## 2015-11-03 DIAGNOSIS — R599 Enlarged lymph nodes, unspecified: Secondary | ICD-10-CM | POA: Diagnosis not present

## 2015-11-03 DIAGNOSIS — I700 Atherosclerosis of aorta: Secondary | ICD-10-CM | POA: Insufficient documentation

## 2015-11-03 DIAGNOSIS — R590 Localized enlarged lymph nodes: Secondary | ICD-10-CM

## 2015-11-03 LAB — CBC WITH DIFFERENTIAL/PLATELET
BASOS ABS: 62 {cells}/uL (ref 0–200)
Basophils Relative: 1 %
EOS ABS: 124 {cells}/uL (ref 15–500)
EOS PCT: 2 %
HCT: 52.1 % — ABNORMAL HIGH (ref 38.5–50.0)
Hemoglobin: 17.6 g/dL — ABNORMAL HIGH (ref 13.2–17.1)
LYMPHS PCT: 26 %
Lymphs Abs: 1612 cells/uL (ref 850–3900)
MCH: 30.3 pg (ref 27.0–33.0)
MCHC: 33.8 g/dL (ref 32.0–36.0)
MCV: 89.7 fL (ref 80.0–100.0)
MONOS PCT: 9 %
MPV: 11.7 fL (ref 7.5–12.5)
Monocytes Absolute: 558 cells/uL (ref 200–950)
NEUTROS ABS: 3844 {cells}/uL (ref 1500–7800)
NEUTROS PCT: 62 %
PLATELETS: 162 10*3/uL (ref 140–400)
RBC: 5.81 MIL/uL — ABNORMAL HIGH (ref 4.20–5.80)
RDW: 13.4 % (ref 11.0–15.0)
WBC: 6.2 10*3/uL (ref 3.8–10.8)

## 2015-11-03 LAB — VITAMIN D 25 HYDROXY (VIT D DEFICIENCY, FRACTURES): VIT D 25 HYDROXY: 78 ng/mL (ref 30–100)

## 2015-11-03 LAB — LIPID PANEL
CHOL/HDL RATIO: 2.8 ratio (ref <=5.0)
CHOLESTEROL: 191 mg/dL (ref 125–200)
HDL: 68 mg/dL (ref >=40)
LDL Cholesterol: 97 mg/dL (ref <130)
TRIGLYCERIDES: 130 mg/dL (ref <150)
VLDL: 26 mg/dL (ref <30)

## 2015-11-03 LAB — BASIC METABOLIC PANEL WITH GFR
BUN: 18 mg/dL (ref 7–25)
CALCIUM: 9.7 mg/dL (ref 8.6–10.3)
CHLORIDE: 101 mmol/L (ref 98–110)
CO2: 29 mmol/L (ref 20–31)
CREATININE: 1.04 mg/dL (ref 0.70–1.25)
GFR, EST AFRICAN AMERICAN: 89 mL/min (ref >=60)
GFR, Est Non African American: 77 mL/min (ref >=60)
Glucose, Bld: 86 mg/dL (ref 65–99)
Potassium: 4.5 mmol/L (ref 3.5–5.3)
SODIUM: 138 mmol/L (ref 135–146)

## 2015-11-03 LAB — HEPATIC FUNCTION PANEL
ALBUMIN: 4.8 g/dL (ref 3.6–5.1)
ALT: 38 U/L (ref 9–46)
AST: 27 U/L (ref 10–35)
Alkaline Phosphatase: 45 U/L (ref 40–115)
BILIRUBIN DIRECT: 0.1 mg/dL (ref <=0.2)
BILIRUBIN TOTAL: 0.6 mg/dL (ref 0.2–1.2)
Indirect Bilirubin: 0.5 mg/dL (ref 0.2–1.2)
Total Protein: 7.1 g/dL (ref 6.1–8.1)

## 2015-11-03 LAB — HEMOGLOBIN A1C
Hgb A1c MFr Bld: 5.7 % — ABNORMAL HIGH (ref <5.7)
Mean Plasma Glucose: 117 mg/dL

## 2015-11-03 LAB — INSULIN, RANDOM: INSULIN: 6.6 u[IU]/mL (ref 2.0–19.6)

## 2015-11-03 LAB — MAGNESIUM: Magnesium: 2 mg/dL (ref 1.5–2.5)

## 2015-11-03 LAB — TESTOSTERONE: TESTOSTERONE: 169 ng/dL — AB (ref 250–827)

## 2015-11-03 LAB — TSH: TSH: 1.16 m[IU]/L (ref 0.40–4.50)

## 2015-12-08 ENCOUNTER — Other Ambulatory Visit: Payer: Self-pay | Admitting: Physician Assistant

## 2015-12-09 NOTE — Telephone Encounter (Signed)
Rx called into [facility]

## 2015-12-16 ENCOUNTER — Other Ambulatory Visit: Payer: Self-pay | Admitting: Internal Medicine

## 2016-02-10 ENCOUNTER — Ambulatory Visit (INDEPENDENT_AMBULATORY_CARE_PROVIDER_SITE_OTHER): Payer: BLUE CROSS/BLUE SHIELD | Admitting: Internal Medicine

## 2016-02-10 ENCOUNTER — Encounter: Payer: Self-pay | Admitting: Internal Medicine

## 2016-02-10 VITALS — BP 122/74 | HR 57 | Temp 97.5°F | Resp 16 | Ht 73.5 in | Wt 214.8 lb

## 2016-02-10 DIAGNOSIS — I10 Essential (primary) hypertension: Secondary | ICD-10-CM

## 2016-02-10 DIAGNOSIS — Z79899 Other long term (current) drug therapy: Secondary | ICD-10-CM

## 2016-02-10 DIAGNOSIS — R5383 Other fatigue: Secondary | ICD-10-CM

## 2016-02-10 DIAGNOSIS — Z0000 Encounter for general adult medical examination without abnormal findings: Secondary | ICD-10-CM

## 2016-02-10 DIAGNOSIS — Z136 Encounter for screening for cardiovascular disorders: Secondary | ICD-10-CM

## 2016-02-10 DIAGNOSIS — Z1212 Encounter for screening for malignant neoplasm of rectum: Secondary | ICD-10-CM

## 2016-02-10 DIAGNOSIS — Z0001 Encounter for general adult medical examination with abnormal findings: Secondary | ICD-10-CM

## 2016-02-10 DIAGNOSIS — Z125 Encounter for screening for malignant neoplasm of prostate: Secondary | ICD-10-CM | POA: Diagnosis not present

## 2016-02-10 DIAGNOSIS — H4010X0 Unspecified open-angle glaucoma, stage unspecified: Secondary | ICD-10-CM | POA: Insufficient documentation

## 2016-02-10 DIAGNOSIS — R7303 Prediabetes: Secondary | ICD-10-CM

## 2016-02-10 DIAGNOSIS — M1000 Idiopathic gout, unspecified site: Secondary | ICD-10-CM

## 2016-02-10 DIAGNOSIS — E785 Hyperlipidemia, unspecified: Secondary | ICD-10-CM

## 2016-02-10 DIAGNOSIS — E559 Vitamin D deficiency, unspecified: Secondary | ICD-10-CM

## 2016-02-10 DIAGNOSIS — K219 Gastro-esophageal reflux disease without esophagitis: Secondary | ICD-10-CM

## 2016-02-10 DIAGNOSIS — E291 Testicular hypofunction: Secondary | ICD-10-CM

## 2016-02-10 LAB — CBC WITH DIFFERENTIAL/PLATELET
BASOS PCT: 1 %
Basophils Absolute: 55 cells/uL (ref 0–200)
Eosinophils Absolute: 55 cells/uL (ref 15–500)
Eosinophils Relative: 1 %
HEMATOCRIT: 49.1 % (ref 38.5–50.0)
Hemoglobin: 16.7 g/dL (ref 13.2–17.1)
LYMPHS PCT: 22 %
Lymphs Abs: 1210 cells/uL (ref 850–3900)
MCH: 31.3 pg (ref 27.0–33.0)
MCHC: 34 g/dL (ref 32.0–36.0)
MCV: 92.1 fL (ref 80.0–100.0)
MONO ABS: 770 {cells}/uL (ref 200–950)
MONOS PCT: 14 %
MPV: 12.3 fL (ref 7.5–12.5)
Neutro Abs: 3410 cells/uL (ref 1500–7800)
Neutrophils Relative %: 62 %
PLATELETS: 145 10*3/uL (ref 140–400)
RBC: 5.33 MIL/uL (ref 4.20–5.80)
RDW: 13.9 % (ref 11.0–15.0)
WBC: 5.5 10*3/uL (ref 3.8–10.8)

## 2016-02-10 LAB — HEMOGLOBIN A1C
Hgb A1c MFr Bld: 5 % (ref <5.7)
Mean Plasma Glucose: 97 mg/dL

## 2016-02-10 LAB — TSH: TSH: 1.27 mIU/L (ref 0.40–4.50)

## 2016-02-10 NOTE — Progress Notes (Signed)
Patient ID: Cory Carrillo, male   DOB: September 16, 1950, 65 y.o.   MRN: [id number]  [facility] ADULT & ADOLESCENT INTERNAL MEDICINE   Lucky Cowboy, M.D.    [name]. [name], P.A.-C      Terri Piedra, P.A.-C   [facility]                [address]                [place], [place]. [address] Telephone [phone] Telefax [phone] _________________________________  Annual  Screening/Preventative Visit And Comprehensive Evaluation & Examination     This very nice 65 yo MWM  presents for a Wellness/Preventative Visit & comprehensive evaluation and management of multiple medical co-morbidities.  Patient has been followed for HTN, Prediabetes, Hyperlipidemia and Vitamin D Deficiency.     HTN predates circa 1994. Patient's BP has been controlled at home.Today's BP is  122/74 mmHg. Patient denies any cardiac symptoms as chest pain, palpitations, shortness of breath, dizziness or ankle swelling.     Patient's hyperlipidemia is controlled with diet and medications. Patient denies myalgias or other medication SE's. Last lipids were at goal with Cholesterol 191; HDL 68; LDL 97; Triglycerides 130 on 11/02/2015.     Patient has prediabetes since 2012 with A1c 5.7% and patient denies reactive hypoglycemic symptoms, visual blurring, diabetic polys or paresthesias. Last A1c was still 5.7% on4/10/2015.        Finally, patient has history of Vitamin D Deficiency of "14" in 2008 and last vitamin D was 78 on4/10/2015.    Medication Sig  . aspirin 81 MG tablet Take 81 mg by mouth daily.  Marland Kitchen atenolol 50 MG tablet TAKE 1 TABLET EVERY DAY FOR BLOOD PRESSURE  . atorvastatin  80 MG tablet TAKE 1 TABLET EVERY DAY FOR CHOLESTEROL  . VITAMIN D Take 2,000 Units by mouth 4 (four) times daily.   . citalopram 40 MG tablet TAKE 1 TABLET BY MOUTH ONCE DAILY FOR MOOD  . finasteride 5 MG tablet TAKE 1 TABLET EVERY DAY  . XALATAN 0.005 % ophth soln Place 1 drop into both eyes at bedtime.  .  meloxicam  15 MG tablet Take once daily as needed daily with food for pain, do not take aleve/ibuprofen with it, can take tylenol  . MultiVit-Min Take by mouth daily.  . tadalafil  20 MG tablet Take 1 tablet (20 mg total) by mouth daily as needed for erectile dysfunction.  Marland Kitchen testosterone cypionate  200 MG/ML inj Inject 1.5-2 ml IM every 2 weeks as directed  . losartan  100 MG tablet Take 1/2 to 1 tablet daily for BP   Allergies  Allergen Reactions  . Ace Inhibitors     cough   Past Medical History  Diagnosis Date  . Hypertension   . Hyperlipidemia   . GERD (gastroesophageal reflux disease)   . Glaucoma   . Prediabetes   . Gout   . Anxiety   . Depression   . Cervical stenosis of spine   . Hypogonadism male   . Vitamin D deficiency    Health Maintenance  Topic Date Due  . COLONOSCOPY  02/11/2003  . ZOSTAVAX  02/10/2013  . INFLUENZA VACCINE  02/29/2016  . TETANUS/TDAP  08/19/2022  . Hepatitis C Screening  Completed  . HIV Screening  Completed   Immunization History  Administered Date(s) Administered  . PPD Test 01/02/2014, 01/21/2015  . Pneumococcal-Unspecified 08/23/1992  . Tdap 08/19/2012   No past surgical history  on file.  Family History  Problem Relation Age of Onset  . Cancer Mother     colon  . Heart disease Father   . Heart disease Brother   . Hypertension Brother     Social History   Social History  . Marital Status: Married    Spouse Name: N/A  . Number of Children: N/A  . Years of Education: N/A   Occupational History  .    Social History Main Topics  . Smoking status: Former Smoker -- 1.50 packs/day    Quit date: 08/23/2006  . Smokeless tobacco: Never Used  . Alcohol Use: 4.2 oz/week    7 Standard drinks or equivalent per week  . Drug Use: No  . Sexual Activity: Not on file    ROS Constitutional: Denies fever, chills, weight loss/gain, headaches, insomnia,  night sweats or change in appetite. Does c/o fatigue. Eyes: Denies redness,  blurred vision, diplopia, discharge, itchy or watery eyes.  ENT: Denies discharge, congestion, post nasal drip, epistaxis, sore throat, earache, hearing loss, dental pain, Tinnitus, Vertigo, Sinus pain or snoring.  Cardio: Denies chest pain, palpitations, irregular heartbeat, syncope, dyspnea, diaphoresis, orthopnea, PND, claudication or edema Respiratory: denies cough, dyspnea, DOE, pleurisy, hoarseness, laryngitis or wheezing.  Gastrointestinal: Denies dysphagia, heartburn, reflux, water brash, pain, cramps, nausea, vomiting, bloating, diarrhea, constipation, hematemesis, melena, hematochezia, jaundice or hemorrhoids Genitourinary: Denies dysuria, frequency, urgency, nocturia, hesitancy, discharge, hematuria or flank pain Musculoskeletal: Denies arthralgia, myalgia, stiffness, Jt. Swelling, pain, limp or strain/sprain. Denies Falls. Skin: Denies puritis, rash, hives, warts, acne, eczema or change in skin lesion Neuro: No weakness, tremor, incoordination, spasms, paresthesia or pain Psychiatric: Denies confusion, memory loss or sensory loss. Denies Depression. Endocrine: Denies change in weight, skin, hair change, nocturia, and paresthesia, diabetic polys, visual blurring or hyper / hypo glycemic episodes.  Heme/Lymph: No excessive bleeding, bruising or enlarged lymph nodes.  Physical Exam  BP 122/74   Pulse 57  Temp 97.5 F   Resp 16  Ht 6' 1.5"   Wt 214 lb 12.8 oz     BMI 27.95   SpO2 97%  General Appearance: Well nourished, in no apparent distress.  Eyes: PERRLA, EOMs, conjunctiva no swelling or erythema, normal fundi and vessels. Sinuses: No frontal/maxillary tenderness ENT/Mouth: EACs patent / TMs  nl. Nares clear without erythema, swelling, mucoid exudates. Oral hygiene is good. No erythema, swelling, or exudate. Tongue normal, non-obstructing. Tonsils not swollen or erythematous. Hearing normal.  Neck: Supple, thyroid normal. No bruits, nodes or JVD. Respiratory: Respiratory  effort normal.  BS equal and clear bilateral without rales, rhonci, wheezing or stridor. Cardio: Heart sounds are normal with regular rate and rhythm and no murmurs, rubs or gallops. Peripheral pulses are normal and equal bilaterally without edema. No aortic or femoral bruits. Chest: symmetric with normal excursions and percussion.  Abdomen: Soft, with Nl bowel sounds. Nontender, no guarding, rebound, hernias, masses, or organomegaly.  Lymphatics: Non tender without lymphadenopathy.  Genitourinary: No hernias.Testes nl. DRE - prostate nl for age - smooth & firm w/o nodules. Musculoskeletal: Full ROM all peripheral extremities, joint stability, 5/5 strength, and normal gait. Skin: Warm and dry without rashes, lesions, cyanosis, clubbing or  ecchymosis.  Neuro: Cranial nerves intact, reflexes equal bilaterally. Normal muscle tone, no cerebellar symptoms. Sensation intact.  Pysch: Alert and oriented X 3 with normal affect, insight and judgment appropriate.   Assessment and Plan  1. Annual Preventative/Screening Exam   - Microalbumin / creatinine urine ratio - EKG 12-Lead - [place],  RETROPERITNL ABD,  LTD - POC Hemoccult Bld/Stl  - Urinalysis, Routine w reflex microscopic - Vitamin B12 - Iron and TIBC - PSA - Testosterone - Uric acid - CBC with Differential/Platelet - BASIC METABOLIC PANEL WITH GFR - Hepatic function panel - Magnesium - Lipid panel - TSH - Hemoglobin A1c - Insulin, random - VITAMIN D 25 Hydroxy   2. Essential hypertension  - Microalbumin / creatinine urine ratio - EKG 12-Lead - US, RETROPERITNL ABD,  LTD - TSH  3. Hyperlipidemia  - Lipid panel - TSH  4. Prediabetes  - Hemoglobin A1c - Insulin, random  5. Vitamin D deficiency  - VITAMIN D 25 Hydroxyl  6. Testosterone Deficiency  - Testosterone  7. Idiopathic gout  - Uric acid  8. Gastroesophageal reflux disease   9. Screening for rectal cancer  - POC Hemoccult Bld/Stl   10. Prostate cancer  screening  - PSA  11. Other fatigue  - Vitamin B12 - Iron and TIBC - Testosterone - CBC with Differential/Platelet - TSH  12. Medication management  - Urinalysis, Routine w reflex microscopic - CBC with Differential/Platelet - BASIC METABOLIC PANEL WITH GFR - Hepatic function panel - Magnesium   Continue prudent diet as discussed, weight control, BP monitoring, regular exercise, and medications as discussed.  Discussed med effects and SE's. Routine screening labs and tests as requested with regular follow-up as recommended. Over 40 minutes of exam, counseling, chart review and high complex critical decision making was performed

## 2016-02-10 NOTE — Patient Instructions (Signed)

## 2016-02-11 LAB — HEPATIC FUNCTION PANEL
ALK PHOS: 43 U/L (ref 40–115)
ALT: 21 U/L (ref 9–46)
AST: 18 U/L (ref 10–35)
Albumin: 4.8 g/dL (ref 3.6–5.1)
BILIRUBIN DIRECT: 0.2 mg/dL (ref <=0.2)
BILIRUBIN INDIRECT: 0.5 mg/dL (ref 0.2–1.2)
BILIRUBIN TOTAL: 0.7 mg/dL (ref 0.2–1.2)
Total Protein: 6.5 g/dL (ref 6.1–8.1)

## 2016-02-11 LAB — LIPID PANEL
Cholesterol: 164 mg/dL (ref 125–200)
HDL: 77 mg/dL (ref >=40)
LDL CALC: 68 mg/dL (ref <130)
TRIGLYCERIDES: 96 mg/dL (ref <150)
Total CHOL/HDL Ratio: 2.1 Ratio (ref <=5.0)
VLDL: 19 mg/dL (ref <30)

## 2016-02-11 LAB — URINALYSIS, MICROSCOPIC ONLY
BACTERIA UA: NONE SEEN [HPF]
Casts: NONE SEEN [LPF]
Crystals: NONE SEEN [HPF]
RBC / HPF: NONE SEEN RBC/HPF (ref <=2)
SQUAMOUS EPITHELIAL / LPF: NONE SEEN [HPF] (ref <=5)
WBC UA: NONE SEEN WBC/HPF (ref <=5)
Yeast: NONE SEEN [HPF]

## 2016-02-11 LAB — IRON AND TIBC
%SAT: 39 % (ref 15–60)
IRON: 124 ug/dL (ref 50–180)
TIBC: 320 ug/dL (ref 250–425)
UIBC: 196 ug/dL (ref 125–400)

## 2016-02-11 LAB — BASIC METABOLIC PANEL WITH GFR
BUN: 14 mg/dL (ref 7–25)
CO2: 24 mmol/L (ref 20–31)
Calcium: 9.1 mg/dL (ref 8.6–10.3)
Chloride: 101 mmol/L (ref 98–110)
Creat: 1.01 mg/dL (ref 0.70–1.25)
GFR, EST NON AFRICAN AMERICAN: 79 mL/min (ref >=60)
Glucose, Bld: 103 mg/dL — ABNORMAL HIGH (ref 65–99)
POTASSIUM: 4.2 mmol/L (ref 3.5–5.3)
SODIUM: 136 mmol/L (ref 135–146)

## 2016-02-11 LAB — MAGNESIUM: Magnesium: 1.9 mg/dL (ref 1.5–2.5)

## 2016-02-11 LAB — URINALYSIS, ROUTINE W REFLEX MICROSCOPIC
BILIRUBIN URINE: NEGATIVE
Glucose, UA: NEGATIVE
Hgb urine dipstick: NEGATIVE
Ketones, ur: NEGATIVE
Nitrite: NEGATIVE
Protein, ur: NEGATIVE
SPECIFIC GRAVITY, URINE: 1.006 (ref 1.001–1.035)
pH: 6.5 (ref 5.0–8.0)

## 2016-02-11 LAB — INSULIN, RANDOM: Insulin: 9.7 u[IU]/mL (ref 2.0–19.6)

## 2016-02-11 LAB — TESTOSTERONE: Testosterone: 254 ng/dL (ref 250–827)

## 2016-02-11 LAB — VITAMIN D 25 HYDROXY (VIT D DEFICIENCY, FRACTURES): Vit D, 25-Hydroxy: 79 ng/mL (ref 30–100)

## 2016-02-11 LAB — PSA: PSA: 2.53 ng/mL (ref <=4.00)

## 2016-02-11 LAB — URIC ACID: Uric Acid, Serum: 7.1 mg/dL (ref 4.0–8.0)

## 2016-02-11 LAB — VITAMIN B12: VITAMIN B 12: 1271 pg/mL — AB (ref 200–1100)

## 2016-02-11 LAB — MICROALBUMIN / CREATININE URINE RATIO
Creatinine, Urine: 27 mg/dL (ref 20–370)
Microalb, Ur: <0.2 mg/dL

## 2016-02-12 ENCOUNTER — Other Ambulatory Visit: Payer: Self-pay | Admitting: Internal Medicine

## 2016-02-12 DIAGNOSIS — R972 Elevated prostate specific antigen [PSA]: Secondary | ICD-10-CM

## 2016-02-14 ENCOUNTER — Other Ambulatory Visit: Payer: BLUE CROSS/BLUE SHIELD

## 2016-02-14 DIAGNOSIS — R972 Elevated prostate specific antigen [PSA]: Secondary | ICD-10-CM

## 2016-02-15 ENCOUNTER — Encounter: Payer: Self-pay | Admitting: Internal Medicine

## 2016-02-15 LAB — PSA, TOTAL AND FREE
PSA, Free Pct: 12 % — ABNORMAL LOW (ref >25)
PSA, Free: 0.12 ng/mL
PSA: 1.02 ng/mL (ref <=4.00)

## 2016-03-09 ENCOUNTER — Other Ambulatory Visit: Payer: Self-pay | Admitting: Internal Medicine

## 2016-03-09 DIAGNOSIS — I10 Essential (primary) hypertension: Secondary | ICD-10-CM

## 2016-05-15 ENCOUNTER — Ambulatory Visit: Payer: Self-pay | Admitting: Internal Medicine

## 2016-05-27 ENCOUNTER — Other Ambulatory Visit: Payer: Self-pay | Admitting: Internal Medicine

## 2016-05-30 ENCOUNTER — Encounter: Payer: Self-pay | Admitting: Internal Medicine

## 2016-05-30 ENCOUNTER — Ambulatory Visit (INDEPENDENT_AMBULATORY_CARE_PROVIDER_SITE_OTHER): Payer: BLUE CROSS/BLUE SHIELD | Admitting: Internal Medicine

## 2016-05-30 VITALS — BP 112/60 | HR 68 | Temp 98.2°F | Resp 16 | Ht 73.5 in | Wt 210.0 lb

## 2016-05-30 DIAGNOSIS — E559 Vitamin D deficiency, unspecified: Secondary | ICD-10-CM

## 2016-05-30 DIAGNOSIS — E349 Endocrine disorder, unspecified: Secondary | ICD-10-CM | POA: Diagnosis not present

## 2016-05-30 DIAGNOSIS — R7303 Prediabetes: Secondary | ICD-10-CM | POA: Diagnosis not present

## 2016-05-30 DIAGNOSIS — E782 Mixed hyperlipidemia: Secondary | ICD-10-CM | POA: Diagnosis not present

## 2016-05-30 DIAGNOSIS — I10 Essential (primary) hypertension: Secondary | ICD-10-CM

## 2016-05-30 DIAGNOSIS — Z79899 Other long term (current) drug therapy: Secondary | ICD-10-CM | POA: Diagnosis not present

## 2016-05-30 NOTE — Consult Note (Deleted)
Assessment and Plan:  Hypertension:  -Continue medication,  -monitor blood pressure at home.  -Continue DASH diet.   -Reminder to go to the ER if any CP, SOB, nausea, dizziness, severe HA, changes vision/speech, left arm numbness and tingling, and jaw pain.  Cholesterol: -Continue diet and exercise.  -Check cholesterol.   Pre-diabetes: -Continue diet and exercise.  -Check A1C  Vitamin D Def: -check level -continue medications.   Test Def -cont testosterone injections   Patient is not wanting labs done today due to cost.    Continue diet and meds as discussed. Further disposition pending results of labs.  HPI 65 y.o. male  presents for 3 month follow up with hypertension, hyperlipidemia, prediabetes and vitamin D.   His blood pressure has been controlled at home, today their BP is BP: 112/60.   He does workout. He denies chest pain, shortness of breath, dizziness.  Blood pressure is generally in the upper 70 to 80 at home for diastolic.     He is on cholesterol medication and denies myalgias. His cholesterol is at goal. The cholesterol last visit was:   Lab Results  Component Value Date   CHOL 164 02/10/2016   HDL 77 02/10/2016   LDLCALC 68 02/10/2016   TRIG 96 02/10/2016   CHOLHDL 2.1 02/10/2016     He has  been working on diet and exercise for prediabetes, and denies foot ulcerations, hyperglycemia, hypoglycemia , increased appetite, nausea, paresthesia of the feet, polydipsia, polyuria, visual disturbances, vomiting and weight loss. Last A1C in the office was:  Lab Results  Component Value Date   HGBA1C 5.0 02/10/2016    Patient is on Vitamin D supplement.  Lab Results  Component Value Date   VD25OH 7679 02/10/2016     He reports that he is only getting up once or twice.    His last testosterone shot was approximately 3 weeks ago.  He reports that he still has some.  He is late due to vacation.       Current Medications:  Current Outpatient Prescriptions  on File Prior to Visit  Medication Sig Dispense Refill  . aspirin 81 MG tablet Take 81 mg by mouth daily.    Marland Kitchen. atenolol (TENORMIN) 50 MG tablet TAKE 1 TABLET BY MOUTH EVERY DAY FOR BLOOD PRESSURE 90 tablet 1  . atorvastatin (LIPITOR) 80 MG tablet TAKE 1 TABLET EVERY DAY FOR CHOLESTEROL 90 tablet 3  . Cholecalciferol (VITAMIN D PO) Take 2,000 Units by mouth 4 (four) times daily.     . citalopram (CELEXA) 40 MG tablet TAKE 1 TABLET BY MOUTH ONCE DAILY FOR MOOD 90 tablet 1  . finasteride (PROSCAR) 5 MG tablet TAKE 1 TABLET EVERY DAY 90 tablet 1  . latanoprost (XALATAN) 0.005 % ophthalmic solution Place 1 drop into both eyes at bedtime.    Marland Kitchen. losartan (COZAAR) 100 MG tablet TAKE ONE TABLET BY MOUTH ONCE DAILY 90 tablet 0  . meloxicam (MOBIC) 15 MG tablet Take once daily as needed daily with food for pain, do not take aleve/ibuprofen with it, can take tylenol 90 tablet 0  . Multiple Vitamins-Minerals (MULTIVITAMIN PO) Take by mouth daily.    . tadalafil (CIALIS) 20 MG tablet Take 1 tablet (20 mg total) by mouth daily as needed for erectile dysfunction. 30 tablet 8  . testosterone cypionate (DEPOTESTOSTERONE CYPIONATE) 200 MG/ML injection Inject 1.5-2 ml IM every 2 weeks as directed 10 mL 3   No current facility-administered medications on file prior to visit.  Medical History:  Past Medical History:  Diagnosis Date  . Anxiety   . Cervical stenosis of spine   . Depression   . GERD (gastroesophageal reflux disease)   . Glaucoma   . Gout   . Hyperlipidemia   . Hypertension   . Hypogonadism male   . Prediabetes   . Vitamin D deficiency     Allergies:  Allergies  Allergen Reactions  . Ace Inhibitors     cough     Review of Systems:  Review of Systems  Constitutional: Negative for chills, fever and malaise/fatigue.  HENT: Negative for congestion, ear pain and sore throat.   Eyes: Negative.   Respiratory: Negative for cough, shortness of breath and wheezing.   Cardiovascular:  Negative for chest pain, palpitations and leg swelling.  Gastrointestinal: Negative for abdominal pain, blood in stool, constipation, diarrhea, heartburn and melena.  Genitourinary: Negative.   Skin: Negative.   Neurological: Negative for dizziness, sensory change, loss of consciousness and headaches.  Psychiatric/Behavioral: Negative for depression. The patient is not nervous/anxious and does not have insomnia.     Family history- Review and unchanged  Social history- Review and unchanged  Physical Exam: BP 112/60   Pulse 68   Temp 98.2 F (36.8 C) (Temporal)   Resp 16   Ht 6' 1.5" (1.867 m)   Wt 210 lb (95.3 kg)   BMI 27.33 kg/m  Wt Readings from Last 3 Encounters:  05/30/16 210 lb (95.3 kg)  02/10/16 214 lb 12.8 oz (97.4 kg)  11/02/15 213 lb 6.4 oz (96.8 kg)    General Appearance: Well nourished well developed, in no apparent distress. Eyes: PERRLA, EOMs, conjunctiva no swelling or erythema ENT/Mouth: Ear canals normal without obstruction, swelling, erythma, discharge.  TMs normal bilaterally.  Oropharynx moist, clear, without exudate, or postoropharyngeal swelling. Neck: Supple, thyroid normal,no cervical adenopathy  Respiratory: Respiratory effort normal, Breath sounds clear A&P without rhonchi, wheeze, or rale.  No retractions, no accessory usage. Cardio: RRR with no MRGs. Brisk peripheral pulses without edema.  Abdomen: Soft, + BS,  Non tender, no guarding, rebound, hernias, masses. Musculoskeletal: Full ROM, 5/5 strength, Normal gait Skin: Warm, dry without rashes, lesions, ecchymosis.  Neuro: Awake and oriented X 3, Cranial nerves intact. Normal muscle tone, no cerebellar symptoms. Psych: Normal affect, Insight and Judgment appropriate.    Terri Piedraourtney Forcucci, PA-C 4:23 PM [facility]

## 2016-05-30 NOTE — Consult Note (Deleted)
Assessment and Plan:  Hypertension:  -Continue medication,  -monitor blood pressure at home.  -Continue DASH diet.   -Reminder to go to the ER if any CP, SOB, nausea, dizziness, severe HA, changes vision/speech, left arm numbness and tingling, and jaw pain.  Cholesterol: -Continue diet and exercise.  -Check cholesterol.   Pre-diabetes: -Continue diet and exercise.  -Check A1C  Vitamin D Def: -check level -continue medications.   Test Def -cont testosterone injections   Patient is not wanting labs done today due to cost.    Continue diet and meds as discussed. Further disposition pending results of labs.  HPI 65 y.o. male  presents for 3 month follow up with hypertension, hyperlipidemia, prediabetes and vitamin D.   His blood pressure has been controlled at home, today their BP is BP: 112/60.   He does workout. He denies chest pain, shortness of breath, dizziness.  Blood pressure is generally in the upper 70 to 80 at home for diastolic.     He is on cholesterol medication and denies myalgias. His cholesterol is at goal. The cholesterol last visit was:   Lab Results  Component Value Date   CHOL 164 02/10/2016   HDL 77 02/10/2016   LDLCALC 68 02/10/2016   TRIG 96 02/10/2016   CHOLHDL 2.1 02/10/2016     He has  been working on diet and exercise for prediabetes, and denies foot ulcerations, hyperglycemia, hypoglycemia , increased appetite, nausea, paresthesia of the feet, polydipsia, polyuria, visual disturbances, vomiting and weight loss. Last A1C in the office was:  Lab Results  Component Value Date   HGBA1C 5.0 02/10/2016    Patient is on Vitamin D supplement.  Lab Results  Component Value Date   VD25OH 7079 02/10/2016     He reports that he is only getting up once or twice.    His last testosterone shot was approximately 3 weeks ago.  He reports that he still has some.  He is late due to vacation.       Current Medications:  Current Outpatient Prescriptions  on File Prior to Visit  Medication Sig Dispense Refill  . aspirin 81 MG tablet Take 81 mg by mouth daily.    Marland Kitchen. atenolol (TENORMIN) 50 MG tablet TAKE 1 TABLET BY MOUTH EVERY DAY FOR BLOOD PRESSURE 90 tablet 1  . atorvastatin (LIPITOR) 80 MG tablet TAKE 1 TABLET EVERY DAY FOR CHOLESTEROL 90 tablet 3  . Cholecalciferol (VITAMIN D PO) Take 2,000 Units by mouth 4 (four) times daily.     . citalopram (CELEXA) 40 MG tablet TAKE 1 TABLET BY MOUTH ONCE DAILY FOR MOOD 90 tablet 1  . finasteride (PROSCAR) 5 MG tablet TAKE 1 TABLET EVERY DAY 90 tablet 1  . latanoprost (XALATAN) 0.005 % ophthalmic solution Place 1 drop into both eyes at bedtime.    Marland Kitchen. losartan (COZAAR) 100 MG tablet TAKE ONE TABLET BY MOUTH ONCE DAILY 90 tablet 0  . meloxicam (MOBIC) 15 MG tablet Take once daily as needed daily with food for pain, do not take aleve/ibuprofen with it, can take tylenol 90 tablet 0  . Multiple Vitamins-Minerals (MULTIVITAMIN PO) Take by mouth daily.    . tadalafil (CIALIS) 20 MG tablet Take 1 tablet (20 mg total) by mouth daily as needed for erectile dysfunction. 30 tablet 8  . testosterone cypionate (DEPOTESTOSTERONE CYPIONATE) 200 MG/ML injection Inject 1.5-2 ml IM every 2 weeks as directed 10 mL 3   No current facility-administered medications on file prior to visit.  Medical History:  Past Medical History:  Diagnosis Date  . Anxiety   . Cervical stenosis of spine   . Depression   . GERD (gastroesophageal reflux disease)   . Glaucoma   . Gout   . Hyperlipidemia   . Hypertension   . Hypogonadism male   . Prediabetes   . Vitamin D deficiency     Allergies:  Allergies  Allergen Reactions  . Ace Inhibitors     cough     Review of Systems:  Review of Systems  Constitutional: Negative for chills, fever and malaise/fatigue.  HENT: Negative for congestion, ear pain and sore throat.   Eyes: Negative.   Respiratory: Negative for cough, shortness of breath and wheezing.   Cardiovascular:  Negative for chest pain, palpitations and leg swelling.  Gastrointestinal: Negative for abdominal pain, blood in stool, constipation, diarrhea, heartburn and melena.  Genitourinary: Negative.   Skin: Negative.   Neurological: Negative for dizziness, sensory change, loss of consciousness and headaches.  Psychiatric/Behavioral: Negative for depression. The patient is not nervous/anxious and does not have insomnia.     Family history- Review and unchanged  Social history- Review and unchanged  Physical Exam: BP 112/60   Pulse 68   Temp 98.2 F (36.8 C) (Temporal)   Resp 16   Ht 6' 1.5" (1.867 m)   Wt 210 lb (95.3 kg)   BMI 27.33 kg/m  Wt Readings from Last 3 Encounters:  05/30/16 210 lb (95.3 kg)  02/10/16 214 lb 12.8 oz (97.4 kg)  11/02/15 213 lb 6.4 oz (96.8 kg)    General Appearance: Well nourished well developed, in no apparent distress. Eyes: PERRLA, EOMs, conjunctiva no swelling or erythema ENT/Mouth: Ear canals normal without obstruction, swelling, erythma, discharge.  TMs normal bilaterally.  Oropharynx moist, clear, without exudate, or postoropharyngeal swelling. Neck: Supple, thyroid normal,no cervical adenopathy  Respiratory: Respiratory effort normal, Breath sounds clear A&P without rhonchi, wheeze, or rale.  No retractions, no accessory usage. Cardio: RRR with no MRGs. Brisk peripheral pulses without edema.  Abdomen: Soft, + BS,  Non tender, no guarding, rebound, hernias, masses. Musculoskeletal: Full ROM, 5/5 strength, Normal gait Skin: Warm, dry without rashes, lesions, ecchymosis.  Neuro: Awake and oriented X 3, Cranial nerves intact. Normal muscle tone, no cerebellar symptoms. Psych: Normal affect, Insight and Judgment appropriate.    Terri Piedraourtney Forcucci, PA-C 5:00 PM [facility]

## 2016-05-30 NOTE — Progress Notes (Signed)
Patient ID: Candelaria CelesteDavid C Albano, male   DOB: 02-07-1951, 65 y.o.   MRN: [id number] Assessment and Plan:  Hypertension:  -Continue medication,  -monitor blood pressure at home.  -Continue DASH diet.   -Reminder to go to the ER if any CP, SOB, nausea, dizziness, severe HA, changes vision/speech, left arm numbness and tingling, and jaw pain.  Cholesterol: -Continue diet and exercise.  -Check cholesterol.   Pre-diabetes: -Continue diet and exercise.  -Check A1C  Vitamin D Def: -check level -continue medications.   Test Def -cont testosterone injections   Patient is not wanting labs done today due to cost.    Continue diet and meds as discussed. Further disposition pending results of labs.  HPI 65 y.o. male  presents for 3 month follow up with hypertension, hyperlipidemia, prediabetes and vitamin D.   His blood pressure has been controlled at home, today their BP is BP: 112/60.   He does workout. He denies chest pain, shortness of breath, dizziness.  Blood pressure is generally in the upper 70 to 80 at home for diastolic.     He is on cholesterol medication and denies myalgias. His cholesterol is at goal. The cholesterol last visit was:   Lab Results  Component Value Date   CHOL 164 02/10/2016   HDL 77 02/10/2016   LDLCALC 68 02/10/2016   TRIG 96 02/10/2016   CHOLHDL 2.1 02/10/2016     He has  been working on diet and exercise for prediabetes, and denies foot ulcerations, hyperglycemia, hypoglycemia , increased appetite, nausea, paresthesia of the feet, polydipsia, polyuria, visual disturbances, vomiting and weight loss. Last A1C in the office was:  Lab Results  Component Value Date   HGBA1C 5.0 02/10/2016    Patient is on Vitamin D supplement.  Lab Results  Component Value Date   VD25OH 7579 02/10/2016     He reports that he is only getting up once or twice.    His last testosterone shot was approximately 3 weeks ago.  He reports that he still has some.  He is late due to  vacation.       Current Medications:  Current Outpatient Prescriptions on File Prior to Visit  Medication Sig Dispense Refill  . aspirin 81 MG tablet Take 81 mg by mouth daily.    Marland Kitchen. atenolol (TENORMIN) 50 MG tablet TAKE 1 TABLET BY MOUTH EVERY DAY FOR BLOOD PRESSURE 90 tablet 1  . atorvastatin (LIPITOR) 80 MG tablet TAKE 1 TABLET EVERY DAY FOR CHOLESTEROL 90 tablet 3  . Cholecalciferol (VITAMIN D PO) Take 2,000 Units by mouth 4 (four) times daily.     . citalopram (CELEXA) 40 MG tablet TAKE 1 TABLET BY MOUTH ONCE DAILY FOR MOOD 90 tablet 1  . finasteride (PROSCAR) 5 MG tablet TAKE 1 TABLET EVERY DAY 90 tablet 1  . latanoprost (XALATAN) 0.005 % ophthalmic solution Place 1 drop into both eyes at bedtime.    Marland Kitchen. losartan (COZAAR) 100 MG tablet TAKE ONE TABLET BY MOUTH ONCE DAILY 90 tablet 0  . meloxicam (MOBIC) 15 MG tablet Take once daily as needed daily with food for pain, do not take aleve/ibuprofen with it, can take tylenol 90 tablet 0  . Multiple Vitamins-Minerals (MULTIVITAMIN PO) Take by mouth daily.    . tadalafil (CIALIS) 20 MG tablet Take 1 tablet (20 mg total) by mouth daily as needed for erectile dysfunction. 30 tablet 8  . testosterone cypionate (DEPOTESTOSTERONE CYPIONATE) 200 MG/ML injection Inject 1.5-2 ml IM every 2 weeks as  directed 10 mL 3   No current facility-administered medications on file prior to visit.     Medical History:  Past Medical History:  Diagnosis Date  . Anxiety   . Cervical stenosis of spine   . Depression   . GERD (gastroesophageal reflux disease)   . Glaucoma   . Gout   . Hyperlipidemia   . Hypertension   . Hypogonadism male   . Prediabetes   . Vitamin D deficiency     Allergies:  Allergies  Allergen Reactions  . Ace Inhibitors     cough     Review of Systems:  Review of Systems  Constitutional: Negative for chills, fever and malaise/fatigue.  HENT: Negative for congestion, ear pain and sore throat.   Eyes: Negative.   Respiratory:  Negative for cough, shortness of breath and wheezing.   Cardiovascular: Negative for chest pain, palpitations and leg swelling.  Gastrointestinal: Negative for abdominal pain, blood in stool, constipation, diarrhea, heartburn and melena.  Genitourinary: Negative.   Skin: Negative.   Neurological: Negative for dizziness, sensory change, loss of consciousness and headaches.  Psychiatric/Behavioral: Negative for depression. The patient is not nervous/anxious and does not have insomnia.     Family history- Review and unchanged  Social history- Review and unchanged  Physical Exam: BP 112/60   Pulse 68   Temp 98.2 F (36.8 C) (Temporal)   Resp 16   Ht 6' 1.5" (1.867 m)   Wt 210 lb (95.3 kg)   BMI 27.33 kg/m  Wt Readings from Last 3 Encounters:  05/30/16 210 lb (95.3 kg)  02/10/16 214 lb 12.8 oz (97.4 kg)  11/02/15 213 lb 6.4 oz (96.8 kg)    General Appearance: Well nourished well developed, in no apparent distress. Eyes: PERRLA, EOMs, conjunctiva no swelling or erythema ENT/Mouth: Ear canals normal without obstruction, swelling, erythma, discharge.  TMs normal bilaterally.  Oropharynx moist, clear, without exudate, or postoropharyngeal swelling. Neck: Supple, thyroid normal,no cervical adenopathy  Respiratory: Respiratory effort normal, Breath sounds clear A&P without rhonchi, wheeze, or rale.  No retractions, no accessory usage. Cardio: RRR with no MRGs. Brisk peripheral pulses without edema.  Abdomen: Soft, + BS,  Non tender, no guarding, rebound, hernias, masses. Musculoskeletal: Full ROM, 5/5 strength, Normal gait Skin: Warm, dry without rashes, lesions, ecchymosis.  Neuro: Awake and oriented X 3, Cranial nerves intact. Normal muscle tone, no cerebellar symptoms. Psych: Normal affect, Insight and Judgment appropriate.    Terri Piedraourtney Forcucci, PA-C 5:00 PM [facility]

## 2016-08-01 ENCOUNTER — Other Ambulatory Visit: Payer: Self-pay | Admitting: *Deleted

## 2016-08-01 DIAGNOSIS — I10 Essential (primary) hypertension: Secondary | ICD-10-CM

## 2016-08-01 MED ORDER — LOSARTAN POTASSIUM 100 MG PO TABS: 100.0000 mg | ORAL_TABLET | Freq: Every day | ORAL | 0 refills | Status: DC

## 2016-08-14 ENCOUNTER — Other Ambulatory Visit: Payer: Self-pay | Admitting: Internal Medicine

## 2016-08-18 ENCOUNTER — Other Ambulatory Visit: Payer: Self-pay | Admitting: Internal Medicine

## 2016-08-18 NOTE — Telephone Encounter (Signed)
Please call D-Test 

## 2016-08-23 ENCOUNTER — Ambulatory Visit: Payer: Self-pay | Admitting: Internal Medicine

## 2016-09-27 ENCOUNTER — Ambulatory Visit (INDEPENDENT_AMBULATORY_CARE_PROVIDER_SITE_OTHER): Payer: 59 | Admitting: Internal Medicine

## 2016-09-27 ENCOUNTER — Encounter: Payer: Self-pay | Admitting: Internal Medicine

## 2016-09-27 VITALS — BP 118/60 | HR 60 | Temp 97.7°F | Resp 16 | Ht 73.5 in | Wt 210.2 lb

## 2016-09-27 DIAGNOSIS — E559 Vitamin D deficiency, unspecified: Secondary | ICD-10-CM | POA: Diagnosis not present

## 2016-09-27 DIAGNOSIS — E349 Endocrine disorder, unspecified: Secondary | ICD-10-CM

## 2016-09-27 DIAGNOSIS — E782 Mixed hyperlipidemia: Secondary | ICD-10-CM | POA: Diagnosis not present

## 2016-09-27 DIAGNOSIS — K219 Gastro-esophageal reflux disease without esophagitis: Secondary | ICD-10-CM | POA: Diagnosis not present

## 2016-09-27 DIAGNOSIS — R7303 Prediabetes: Secondary | ICD-10-CM | POA: Diagnosis not present

## 2016-09-27 DIAGNOSIS — I10 Essential (primary) hypertension: Secondary | ICD-10-CM | POA: Diagnosis not present

## 2016-09-27 DIAGNOSIS — Z79899 Other long term (current) drug therapy: Secondary | ICD-10-CM

## 2016-09-27 DIAGNOSIS — M1000 Idiopathic gout, unspecified site: Secondary | ICD-10-CM

## 2016-09-27 LAB — CBC WITH DIFFERENTIAL/PLATELET
Basophils Absolute: 49 cells/uL (ref 0–200)
Basophils Relative: 1 %
EOS PCT: 1 %
Eosinophils Absolute: 49 cells/uL (ref 15–500)
HCT: 48.7 % (ref 38.5–50.0)
Hemoglobin: 16.6 g/dL (ref 13.2–17.1)
LYMPHS PCT: 33 %
Lymphs Abs: 1617 cells/uL (ref 850–3900)
MCH: 30.2 pg (ref 27.0–33.0)
MCHC: 34.1 g/dL (ref 32.0–36.0)
MCV: 88.5 fL (ref 80.0–100.0)
MPV: 13 fL — ABNORMAL HIGH (ref 7.5–12.5)
Monocytes Absolute: 392 cells/uL (ref 200–950)
Monocytes Relative: 8 %
Neutro Abs: 2793 cells/uL (ref 1500–7800)
Neutrophils Relative %: 57 %
Platelets: 144 10*3/uL (ref 140–400)
RBC: 5.5 MIL/uL (ref 4.20–5.80)
RDW: 13.2 % (ref 11.0–15.0)
WBC: 4.9 10*3/uL (ref 3.8–10.8)

## 2016-09-27 LAB — HEMOGLOBIN A1C
Hgb A1c MFr Bld: 5.2 % (ref <5.7)
MEAN PLASMA GLUCOSE: 103 mg/dL

## 2016-09-27 LAB — TSH: TSH: 1.04 mIU/L (ref 0.40–4.50)

## 2016-09-27 NOTE — Progress Notes (Signed)
[facility] Cory Carrillo, M.D.        [name]. [name], P.A.-C       Terri Piedra, P.A.-C  [facility]                [address]                [place], [place]. [address] Telephone [phone] Telefax [phone] ______________________________________________________________________     This very nice 66 y.o. MWM presents for 6 month follow up with Hypertension, Hyperlipidemia, Pre-Diabetes, Gout , Testosterone  and Vitamin D Deficiency.      Patient is treated for HTN (1994) & BP has been controlled at home. Today's BP is at goal - 118/60. Patient has had no complaints of any cardiac type chest pain, palpitations, dyspnea/orthopnea/PND, dizziness, claudication, or dependent edema.     Hyperlipidemia is controlled with diet & meds. Patient denies myalgias or other med SE's. Last Lipids were at goal: Lab Results  Component Value Date   CHOL 164 02/10/2016   HDL 77 02/10/2016   LDLCALC 68 02/10/2016   TRIG 96 02/10/2016   CHOLHDL 2.1 02/10/2016      Also, the patient has history of PreDiabetes (A1c 5.7% in 2-012) and has had no symptoms of reactive hypoglycemia, diabetic polys, paresthesias or visual blurring.  Last A1c was at goal:  Lab Results  Component Value Date   HGBA1C 5.0 02/10/2016      Further, the patient also has history of Vitamin D Deficiency ("14" in 2008)  and supplements vitamin D without any suspected side-effects. Last vitamin D was at goal:  Lab Results  Component Value Date   VD25OH 79 02/10/2016   Current Outpatient Prescriptions on File Prior to Visit  Medication Sig  . aspirin 81 MG tablet Take 81 mg by mouth daily.  Marland Kitchen atenolol 50 MG tablet TAKE 1 TAB EVERY DAY  . atorvastatin  80 MG tablet TAKE 1 TAB EVERY DAY  - Takes 1/4 tab 3 x/wk  . VITAMIN D  Take 2,000 Units  4times daily.   . citalopram  40 MG tablet TAKE 1 TAB ONCE DAILY FOR MOOD - Takes 1/2 tab daily  . finasteride   5 MG tablet TAKE 1 TABLET EVERY DAY - Takes 1/4 tab daily   . XALATAN 0.005 % ophth soln Place 1 drop into both eyes at bedtime.  . Losartan 100 MG tablet Take 1 tab daily. - Takes 1/2 tab daily  . Multiple Vitamins-Minerals  Take by mouth daily.  . tadalafil  20 MG tablet Take 1 tab daily as needed  . testosterone cypio 200 MG inj INJECT 1.5-2MLS IM EVERY 2 WEEKS   Allergies  Allergen Reactions  . Ace Inhibitors     cough   PMHx:   Past Medical History:  Diagnosis Date  . Anxiety   . Cervical stenosis of spine   . Depression   . GERD (gastroesophageal reflux disease)   . Glaucoma   . Gout   . Hyperlipidemia   . Hypertension   . Hypogonadism male   . Prediabetes   . Vitamin D deficiency    Immunization History  Administered Date(s) Administered  . PPD Test 01/02/2014, 01/21/2015  . Pneumococcal-Unspecified 08/23/1992  . Tdap 08/19/2012   No past surgical history on file. FHx:    Reviewed / unchanged  SHx:    Reviewed / unchanged  Systems Review:  Constitutional: Denies fever, chills, wt changes, headaches,  insomnia, fatigue, night sweats, change in appetite. Eyes: Denies redness, blurred vision, diplopia, discharge, itchy, watery eyes.  ENT: Denies discharge, congestion, post nasal drip, epistaxis, sore throat, earache, hearing loss, dental pain, tinnitus, vertigo, sinus pain, snoring.  CV: Denies chest pain, palpitations, irregular heartbeat, syncope, dyspnea, diaphoresis, orthopnea, PND, claudication or edema. Respiratory: denies cough, dyspnea, DOE, pleurisy, hoarseness, laryngitis, wheezing.  Gastrointestinal: Denies dysphagia, odynophagia, heartburn, reflux, water brash, abdominal pain or cramps, nausea, vomiting, bloating, diarrhea, constipation, hematemesis, melena, hematochezia  or hemorrhoids. Genitourinary: Denies dysuria, frequency, urgency, nocturia, hesitancy, discharge, hematuria or flank pain. Musculoskeletal: Denies arthralgias, myalgias, stiffness, jt.  swelling, pain, limping or strain/sprain.  Skin: Denies pruritus, rash, hives, warts, acne, eczema or change in skin lesion(s). Neuro: No weakness, tremor, incoordination, spasms, paresthesia or pain. Psychiatric: Denies confusion, memory loss or sensory loss. Endo: Denies change in weight, skin or hair change.  Heme/Lymph: No excessive bleeding, bruising or enlarged lymph nodes.  Physical Exam  BP 118/60   Pulse 60   Temp 97.7 F (36.5 C)   Resp 16   Ht 6' 1.5" (1.867 m)   Wt 210 lb 3.2 oz (95.3 kg)   SpO2 98%   BMI 27.36 kg/m   Appears well nourished and in no distress.  Eyes: PERRLA, EOMs, conjunctiva no swelling or erythema. Sinuses: No frontal/maxillary tenderness ENT/Mouth: EAC's clear, TM's nl w/o erythema, bulging. Nares clear w/o erythema, swelling, exudates. Oropharynx clear without erythema or exudates. Oral hygiene is good. Tongue normal, non obstructing. Hearing intact.  Neck: Supple. Thyroid nl. Car 2+/2+ without bruits, nodes or JVD. Chest: Respirations nl with BS clear & equal w/o rales, rhonchi, wheezing or stridor.  Cor: Heart sounds normal w/ regular rate and rhythm without sig. murmurs, gallops, clicks, or rubs. Peripheral pulses normal and equal  without edema.  Abdomen: Soft & bowel sounds normal. Non-tender w/o guarding, rebound, hernias, masses, or organomegaly.  Lymphatics: Unremarkable.  Musculoskeletal: Full ROM all peripheral extremities, joint stability, 5/5 strength, and normal gait.  Skin: Warm, dry without exposed rashes, lesions or ecchymosis apparent.  Neuro: Cranial nerves intact, reflexes equal bilaterally. Sensory-motor testing grossly intact. Tendon reflexes grossly intact.  Pysch: Alert & oriented x 3.  Insight and judgement nl & appropriate. No ideations.  Assessment and Plan:  1. Essential hypertension  - Continue medication, monitor blood pressure at home.  - Continue DASH diet. Reminder to go to the ER if any CP,  SOB, nausea,  dizziness, severe HA, changes vision/speech,  left arm numbness and tingling and jaw pain. - CBC with Differential/Platelet - BASIC METABOLIC PANEL WITH GFR - Magnesium - TSH  2. Mixed hyperlipidemia  - Continue diet/meds, exercise,& lifestyle modifications.  - Continue monitor periodic cholesterol/liver & renal functions  - Hepatic function panel - Lipid panel - TSH  3. Prediabetes  - Continue diet, exercise, lifestyle modifications.  - Monitor appropriate labs. - Hemoglobin A1c - Insulin, random  4. Vitamin D deficiency  - Continue supplementation. - VITAMIN D 25 Hydroxy   5. Testosterone deficiency  - Testosterone  6. Idiopathic gout   7. Gastroesophageal reflux disease   8. Medication management  - CBC with Differential/Platelet - BASIC METABOLIC PANEL WITH GFR - Hepatic function panel - Magnesium - Lipid panel - TSH - Hemoglobin A1c - Insulin, random - VITAMIN D 25 Hydroxy  - Testosterone       Recommended regular exercise, BP monitoring, weight control, and discussed med and SE's. Recommended labs to assess and monitor clinical status. Further disposition  pending results of labs. Over 30 minutes of exam, counseling, chart review was performed

## 2016-09-27 NOTE — Patient Instructions (Signed)

## 2016-09-28 ENCOUNTER — Other Ambulatory Visit: Payer: Self-pay | Admitting: Internal Medicine

## 2016-09-28 LAB — INSULIN, RANDOM: INSULIN: 5.4 u[IU]/mL (ref 2.0–19.6)

## 2016-09-28 LAB — BASIC METABOLIC PANEL WITH GFR
BUN: 15 mg/dL (ref 7–25)
CALCIUM: 9.7 mg/dL (ref 8.6–10.3)
CO2: 22 mmol/L (ref 20–31)
CREATININE: 1.04 mg/dL (ref 0.70–1.25)
Chloride: 102 mmol/L (ref 98–110)
GFR, EST AFRICAN AMERICAN: 88 mL/min (ref >=60)
GFR, EST NON AFRICAN AMERICAN: 76 mL/min (ref >=60)
GLUCOSE: 92 mg/dL (ref 65–99)
Potassium: 4.2 mmol/L (ref 3.5–5.3)
SODIUM: 140 mmol/L (ref 135–146)

## 2016-09-28 LAB — LIPID PANEL
CHOLESTEROL: 169 mg/dL (ref <200)
HDL: 50 mg/dL (ref >40)
LDL Cholesterol: 89 mg/dL (ref <100)
TRIGLYCERIDES: 149 mg/dL (ref <150)
Total CHOL/HDL Ratio: 3.4 Ratio (ref <5.0)
VLDL: 30 mg/dL (ref <30)

## 2016-09-28 LAB — VITAMIN D 25 HYDROXY (VIT D DEFICIENCY, FRACTURES): VIT D 25 HYDROXY: 74 ng/mL (ref 30–100)

## 2016-09-28 LAB — HEPATIC FUNCTION PANEL
ALT: 30 U/L (ref 9–46)
AST: 20 U/L (ref 10–35)
Albumin: 4.7 g/dL (ref 3.6–5.1)
Alkaline Phosphatase: 41 U/L (ref 40–115)
BILIRUBIN INDIRECT: 0.6 mg/dL (ref 0.2–1.2)
Bilirubin, Direct: 0.1 mg/dL (ref <=0.2)
TOTAL PROTEIN: 6.7 g/dL (ref 6.1–8.1)
Total Bilirubin: 0.7 mg/dL (ref 0.2–1.2)

## 2016-09-28 LAB — MAGNESIUM: MAGNESIUM: 2 mg/dL (ref 1.5–2.5)

## 2016-09-28 LAB — TESTOSTERONE: TESTOSTERONE: 741 ng/dL (ref 250–827)

## 2016-12-18 ENCOUNTER — Other Ambulatory Visit: Payer: Self-pay | Admitting: Internal Medicine

## 2016-12-31 NOTE — Progress Notes (Signed)
Assessment and Plan:   Hypertension -Continue medication, monitor blood pressure at home. Continue DASH diet.  Reminder to go to the ER if any CP, SOB, nausea, dizziness, severe HA, changes vision/speech, left arm numbness and tingling and jaw pain.   Cholesterol -Continue diet and exercise. Check cholesterol.   Prediabetes  -Continue diet and exercise. Check A1C   Vitamin D Def - check level and continue medications.    Hypogonadism - continue to monitor, states medication is helping with symptoms of low T Cialis for ED.    Continue diet and meds as discussed. Further disposition pending results of labs.  HPI 65 y.o. male  presents for 3 month follow up39   has a past medical history of Anxiety; Cervical stenosis of spine; Depression; GERD (gastroesophageal reflux disease); Glaucoma; Gout; Hyperlipidemia; Hypertension; Hypogonadism male; Prediabetes; and Vitamin D deficiency.  His blood pressure has been controlled at home,  today their BP is BP: 118/72  He does not workout, and no regular exercise but with the warmer weather he will get more exercise. He denies chest pain, shortness of breath, dizziness.  He is on cholesterol medication, lipitor 80 and take 20mg  M,W,F and denies myalgias. His cholesterol is at goal. The cholesterol last visit was:   Lab Results  Component Value Date   CHOL 169 09/27/2016   HDL 50 09/27/2016   LDLCALC 89 09/27/2016   TRIG 149 09/27/2016   CHOLHDL 3.4 09/27/2016    He has been working on diet and exercise for prediabetes, and denies paresthesia of the feet, polydipsia, polyuria and visual disturbances. Last A1C in the office was:  Lab Results  Component Value Date   HGBA1C 5.2 09/27/2016  Patient is on Vitamin D supplement.   Lab Results  Component Value Date   VD25OH 3674 09/27/2016  He has a history of testosterone deficiency and is on testosterone replacement,  He states that the testosterone helps with his energy, libido, muscle mass. Lab  Results  Component Value Date   TESTOSTERONE 741 09/27/2016      Current Medications:  Current Outpatient Prescriptions on File Prior to Visit  Medication Sig Dispense Refill  . aspirin 81 MG tablet Take 81 mg by mouth daily.    Marland Kitchen. atenolol (TENORMIN) 50 MG tablet TAKE 1 TABLET BY MOUTH EVERY DAY FOR BLOOD PRESSURE 90 tablet 1  . atorvastatin (LIPITOR) 80 MG tablet TAKE 1 TABLET EVERY DAY FOR CHOLESTEROL 90 tablet 3  . Cholecalciferol (VITAMIN D PO) Take 2,000 Units by mouth 4 (four) times daily.     . citalopram (CELEXA) 40 MG tablet TAKE 1 TABLET BY MOUTH EVERY DAY FOR MOOD 90 tablet 0  . finasteride (PROSCAR) 5 MG tablet TAKE 1 TABLET EVERY DAY 90 tablet 1  . latanoprost (XALATAN) 0.005 % ophthalmic solution Place 1 drop into both eyes at bedtime.    Marland Kitchen. losartan (COZAAR) 100 MG tablet Take 1 tablet (100 mg total) by mouth daily. 90 tablet 0  . Multiple Vitamins-Minerals (MULTIVITAMIN PO) Take by mouth daily.    . tadalafil (CIALIS) 20 MG tablet Take 1 tablet (20 mg total) by mouth daily as needed for erectile dysfunction. 30 tablet 8  . testosterone cypionate (DEPOTESTOSTERONE CYPIONATE) 200 MG/ML injection INJECT 1.5-2MLS INTRAMUSCULARLY EVERY 2 WEEKS 30 mL 1   No current facility-administered medications on file prior to visit.    Medical History:  Past Medical History:  Diagnosis Date  . Anxiety   . Cervical stenosis of spine   . Depression   .  GERD (gastroesophageal reflux disease)   . Glaucoma   . Gout   . Hyperlipidemia   . Hypertension   . Hypogonadism male   . Prediabetes   . Vitamin D deficiency    Allergies:  Allergies  Allergen Reactions  . Ace Inhibitors     cough     Review of Systems:  Review of Systems  Constitutional: Negative.   HENT: Negative.   Eyes: Negative.   Respiratory: Negative.   Cardiovascular: Negative.   Gastrointestinal: Negative.   Genitourinary: Negative.   Musculoskeletal: Positive for back pain (lower back pain, had ESI 1 year  ago) and joint pain. Negative for falls, myalgias and neck pain.  Skin: Negative.   Neurological: Negative.   Endo/Heme/Allergies: Negative.   Psychiatric/Behavioral: Negative.     Family history- Review and unchanged Social history- Review and unchanged Physical Exam: BP 118/72   Pulse 66   Temp 97.3 F (36.3 C)   Resp 14   Ht 6' 1.5" (1.867 m)   Wt 204 lb 12.8 oz (92.9 kg)   SpO2 98%   BMI 26.65 kg/m  Wt Readings from Last 3 Encounters:  01/01/17 204 lb 12.8 oz (92.9 kg)  09/27/16 210 lb 3.2 oz (95.3 kg)  05/30/16 210 lb (95.3 kg)   General Appearance: Well nourished, in no apparent distress. Eyes: PERRLA, EOMs, conjunctiva no swelling or erythema Sinuses: No Frontal/maxillary tenderness ENT/Mouth: Ext aud canals clear, TMs without erythema, bulging. No erythema, swelling, or exudate on post pharynx.  Tonsils not swollen or erythematous. Hearing normal.  Neck: Supple, thyroid normal.  Respiratory: Respiratory effort normal, BS equal bilaterally without rales, rhonchi, wheezing or stridor.  Cardio: RRR with no MRGs. Brisk peripheral pulses without edema.  Abdomen: Soft, + BS,  Non tender, no guarding, rebound, hernias, masses. Lymphatics: Non tender without lymphadenopathy.  Musculoskeletal: Full ROM, 5/5 strength, Patient is able to ambulate well.  Skin: Warm, dry without rashes, lesions, ecchymosis.  Neuro: Cranial nerves intact. Normal muscle tone, no cerebellar symptoms. Psych: Awake and oriented X 3, normal affect, Insight and Judgment appropriate.   Future Appointments Date Time Provider Department Center  03/13/2017 2:00 PM Lucky Cowboy, MD [id number] None    Quentin Mulling, PA-C 2:37 PM [facility]

## 2017-01-01 ENCOUNTER — Encounter: Payer: Self-pay | Admitting: Physician Assistant

## 2017-01-01 ENCOUNTER — Ambulatory Visit (INDEPENDENT_AMBULATORY_CARE_PROVIDER_SITE_OTHER): Payer: 59 | Admitting: Physician Assistant

## 2017-01-01 VITALS — BP 118/72 | HR 66 | Temp 97.3°F | Resp 14 | Ht 73.5 in | Wt 204.8 lb

## 2017-01-01 DIAGNOSIS — E782 Mixed hyperlipidemia: Secondary | ICD-10-CM

## 2017-01-01 DIAGNOSIS — Z79899 Other long term (current) drug therapy: Secondary | ICD-10-CM | POA: Diagnosis not present

## 2017-01-01 DIAGNOSIS — I10 Essential (primary) hypertension: Secondary | ICD-10-CM | POA: Diagnosis not present

## 2017-01-01 DIAGNOSIS — E291 Testicular hypofunction: Secondary | ICD-10-CM

## 2017-01-01 DIAGNOSIS — M1000 Idiopathic gout, unspecified site: Secondary | ICD-10-CM | POA: Diagnosis not present

## 2017-01-01 LAB — CBC WITH DIFFERENTIAL/PLATELET
BASOS ABS: 48 {cells}/uL (ref 0–200)
Basophils Relative: 1 %
EOS ABS: 144 {cells}/uL (ref 15–500)
Eosinophils Relative: 3 %
HEMATOCRIT: 43.1 % (ref 38.5–50.0)
HEMOGLOBIN: 14.7 g/dL (ref 13.2–17.1)
LYMPHS ABS: 1776 {cells}/uL (ref 850–3900)
Lymphocytes Relative: 37 %
MCH: 29.8 pg (ref 27.0–33.0)
MCHC: 34.1 g/dL (ref 32.0–36.0)
MCV: 87.2 fL (ref 80.0–100.0)
MONO ABS: 336 {cells}/uL (ref 200–950)
Monocytes Relative: 7 %
NEUTROS PCT: 52 %
Neutro Abs: 2496 cells/uL (ref 1500–7800)
Platelets: 153 10*3/uL (ref 140–400)
RBC: 4.94 MIL/uL (ref 4.20–5.80)
RDW: 14.8 % (ref 11.0–15.0)
WBC: 4.8 10*3/uL (ref 3.8–10.8)

## 2017-01-01 LAB — TSH: TSH: 1.07 mIU/L (ref 0.40–4.50)

## 2017-01-02 LAB — HEPATIC FUNCTION PANEL
ALT: 31 U/L (ref 9–46)
AST: 19 U/L (ref 10–35)
Albumin: 4.6 g/dL (ref 3.6–5.1)
Alkaline Phosphatase: 40 U/L (ref 40–115)
BILIRUBIN INDIRECT: 0.5 mg/dL (ref 0.2–1.2)
Bilirubin, Direct: 0.1 mg/dL (ref <=0.2)
TOTAL PROTEIN: 6.7 g/dL (ref 6.1–8.1)
Total Bilirubin: 0.6 mg/dL (ref 0.2–1.2)

## 2017-01-02 LAB — BASIC METABOLIC PANEL WITH GFR
BUN: 16 mg/dL (ref 7–25)
CALCIUM: 9.7 mg/dL (ref 8.6–10.3)
CO2: 21 mmol/L (ref 20–31)
CREATININE: 1.13 mg/dL (ref 0.70–1.25)
Chloride: 105 mmol/L (ref 98–110)
GFR, Est African American: 80 mL/min (ref >=60)
GFR, Est Non African American: 69 mL/min (ref >=60)
GLUCOSE: 88 mg/dL (ref 65–99)
Potassium: 4.9 mmol/L (ref 3.5–5.3)
Sodium: 139 mmol/L (ref 135–146)

## 2017-01-02 LAB — LIPID PANEL
CHOLESTEROL: 193 mg/dL (ref <200)
HDL: 65 mg/dL (ref >40)
LDL Cholesterol: 90 mg/dL (ref <100)
Total CHOL/HDL Ratio: 3 Ratio (ref <5.0)
Triglycerides: 191 mg/dL — ABNORMAL HIGH (ref <150)
VLDL: 38 mg/dL — AB (ref <30)

## 2017-01-02 LAB — MAGNESIUM: Magnesium: 2.1 mg/dL (ref 1.5–2.5)

## 2017-01-02 LAB — URIC ACID: URIC ACID, SERUM: 7.1 mg/dL (ref 4.0–8.0)

## 2017-02-26 ENCOUNTER — Encounter: Payer: Self-pay | Admitting: Physician Assistant

## 2017-02-27 ENCOUNTER — Other Ambulatory Visit: Payer: Self-pay | Admitting: Physician Assistant

## 2017-02-27 MED ORDER — TADALAFIL 20 MG PO TABS: 20.0000 mg | ORAL_TABLET | Freq: Every day | ORAL | 8 refills | Status: DC | PRN

## 2017-03-10 ENCOUNTER — Other Ambulatory Visit: Payer: Self-pay | Admitting: Physician Assistant

## 2017-03-10 ENCOUNTER — Other Ambulatory Visit: Payer: Self-pay | Admitting: Internal Medicine

## 2017-03-10 NOTE — Telephone Encounter (Signed)
Please call D-Test 

## 2017-03-12 ENCOUNTER — Other Ambulatory Visit: Payer: Self-pay | Admitting: *Deleted

## 2017-03-12 MED ORDER — ATENOLOL 50 MG PO TABS: ORAL_TABLET | ORAL | 1 refills | Status: DC

## 2017-03-13 ENCOUNTER — Encounter: Payer: Self-pay | Admitting: Internal Medicine

## 2017-03-13 ENCOUNTER — Ambulatory Visit (INDEPENDENT_AMBULATORY_CARE_PROVIDER_SITE_OTHER): Payer: 59 | Admitting: Internal Medicine

## 2017-03-13 VITALS — BP 122/76 | HR 64 | Temp 97.0°F | Resp 18 | Ht 73.5 in | Wt 211.4 lb

## 2017-03-13 DIAGNOSIS — Z136 Encounter for screening for cardiovascular disorders: Secondary | ICD-10-CM

## 2017-03-13 DIAGNOSIS — E559 Vitamin D deficiency, unspecified: Secondary | ICD-10-CM

## 2017-03-13 DIAGNOSIS — R5383 Other fatigue: Secondary | ICD-10-CM

## 2017-03-13 DIAGNOSIS — Z0000 Encounter for general adult medical examination without abnormal findings: Secondary | ICD-10-CM

## 2017-03-13 DIAGNOSIS — M1000 Idiopathic gout, unspecified site: Secondary | ICD-10-CM

## 2017-03-13 DIAGNOSIS — Z79899 Other long term (current) drug therapy: Secondary | ICD-10-CM

## 2017-03-13 DIAGNOSIS — Z125 Encounter for screening for malignant neoplasm of prostate: Secondary | ICD-10-CM

## 2017-03-13 DIAGNOSIS — I10 Essential (primary) hypertension: Secondary | ICD-10-CM | POA: Diagnosis not present

## 2017-03-13 DIAGNOSIS — E782 Mixed hyperlipidemia: Secondary | ICD-10-CM

## 2017-03-13 DIAGNOSIS — Z1212 Encounter for screening for malignant neoplasm of rectum: Secondary | ICD-10-CM

## 2017-03-13 DIAGNOSIS — E349 Endocrine disorder, unspecified: Secondary | ICD-10-CM

## 2017-03-13 DIAGNOSIS — K219 Gastro-esophageal reflux disease without esophagitis: Secondary | ICD-10-CM

## 2017-03-13 DIAGNOSIS — Z0001 Encounter for general adult medical examination with abnormal findings: Secondary | ICD-10-CM

## 2017-03-13 DIAGNOSIS — R7303 Prediabetes: Secondary | ICD-10-CM

## 2017-03-13 LAB — CBC WITH DIFFERENTIAL/PLATELET
BASOS ABS: 52 {cells}/uL (ref 0–200)
Basophils Relative: 1 %
EOS ABS: 104 {cells}/uL (ref 15–500)
Eosinophils Relative: 2 %
HCT: 46.7 % (ref 38.5–50.0)
HEMOGLOBIN: 15.9 g/dL (ref 13.2–17.1)
LYMPHS ABS: 1768 {cells}/uL (ref 850–3900)
Lymphocytes Relative: 34 %
MCH: 30.9 pg (ref 27.0–33.0)
MCHC: 34 g/dL (ref 32.0–36.0)
MCV: 90.9 fL (ref 80.0–100.0)
MPV: 12.9 fL — ABNORMAL HIGH (ref 7.5–12.5)
Monocytes Absolute: 468 cells/uL (ref 200–950)
Monocytes Relative: 9 %
NEUTROS ABS: 2808 {cells}/uL (ref 1500–7800)
Neutrophils Relative %: 54 %
Platelets: 151 10*3/uL (ref 140–400)
RBC: 5.14 MIL/uL (ref 4.20–5.80)
RDW: 14 % (ref 11.0–15.0)
WBC: 5.2 10*3/uL (ref 3.8–10.8)

## 2017-03-13 NOTE — Patient Instructions (Signed)

## 2017-03-13 NOTE — Progress Notes (Signed)
Cory Carrillo   Lucky Cowboy, M.D.      Cory Carrillo. Cory Carrillo, P.A.-C [facility]                [address]                [place], [place]. [address] Telephone [phone] Telefax [phone] Annual  Screening/Preventative Visit  & Comprehensive Evaluation & Examination     This very nice 66 y.o. MWM presents for a Screening/Preventative Visit & comprehensive evaluation and management of multiple medical co-morbidities.  Patient has been followed for HTN, Prediabetes, Hyperlipidemia and Vitamin D Deficiency.     HTN predates since 61. Patient's BP has been controlled at home.  Today's BP is at goal - 122/76. Patient denies any cardiac symptoms as chest pain, palpitations, shortness of breath, dizziness or ankle swelling.     Patient's hyperlipidemia is controlled with diet and medications. Patient denies myalgias or other medication SE's. Last lipids were at goal albeit elevated Trig's: Lab Results  Component Value Date   CHOL 193 01/01/2017   HDL 65 01/01/2017   LDLCALC 90 01/01/2017   TRIG 191 (H) 01/01/2017   CHOLHDL 3.0 01/01/2017      Patient has prediabetes (A1c 5.7% in 2012) and patient denies reactive hypoglycemic symptoms, visual blurring, diabetic polys or paresthesias. Last A1c was at goal: Lab Results  Component Value Date   HGBA1C 5.2 09/27/2016       Finally, patient has history of Vitamin D Deficiency of  ("14" in 2008) and last vitamin D was at goal: Lab Results  Component Value Date   VD25OH 74 09/27/2016   Current Outpatient Prescriptions on File Prior to Visit  Medication Sig  . aspirin 81 MG tablet Take 81 mg by mouth daily.  Marland Kitchen atenolol (TENORMIN) 50 MG tablet TAKE 1 TABLET BY MOUTH EVERY DAY FOR BLOOD PRESSURE  . atorvastatin (LIPITOR) 80 MG tablet TAKE 1 TABLET EVERY DAY FOR CHOLESTEROL  . Cholecalciferol (VITAMIN D PO) Take 2,000 Units by mouth 4 (four) times daily.   . citalopram  (CELEXA) 40 MG tablet TAKE 1 TABLET BY MOUTH EVERY DAY FOR MOOD  . finasteride (PROSCAR) 5 MG tablet TAKE 1 TABLET EVERY DAY  . latanoprost (XALATAN) 0.005 % ophthalmic solution Place 1 drop into both eyes at bedtime.  Marland Kitchen losartan (COZAAR) 100 MG tablet Take 1 tablet (100 mg total) by mouth daily.  . Multiple Vitamins-Minerals (MULTIVITAMIN PO) Take by mouth daily.  . tadalafil (CIALIS) 20 MG tablet Take 1 tablet (20 mg total) by mouth daily as needed for erectile dysfunction.  Marland Kitchen testosterone cypionate (DEPOTESTOSTERONE CYPIONATE) 200 MG/ML injection INJECT 1.5 ML TO 2 ML INTO MUSCLE EVERY 2 WEEKS   No current facility-administered medications on file prior to visit.    Allergies  Allergen Reactions  . Ace Inhibitors     cough   Past Medical History:  Diagnosis Date  . Anxiety   . Cervical stenosis of spine   . Depression   . GERD (gastroesophageal reflux disease)   . Glaucoma   . Gout   . Hyperlipidemia   . Hypertension   . Hypogonadism male   . Prediabetes   . Vitamin D deficiency    Health Maintenance  Topic Date Due  . COLONOSCOPY  02/11/2003  . INFLUENZA VACCINE  02/28/2017  . TETANUS/TDAP  08/19/2022  . Hepatitis C Screening  Completed  . HIV Screening  Completed   Immunization  History  Administered Date(s) Administered  . PPD Test 01/02/2014, 01/21/2015  . Pneumococcal-Unspecified 08/23/1992  . Tdap 08/19/2012   Family History  Problem Relation Age of Onset  . Cancer Mother        colon  . Heart disease Father   . Heart disease Brother   . Hypertension Brother    Social History   Social History  . Marital status: Married    Spouse name: N/A  . Number of children: N/A  . Years of education: N/A   Occupational History  . Not on file.   Social History Main Topics  . Smoking status: Former Smoker    Packs/day: 1.50    Quit date: 08/23/2006  . Smokeless tobacco: Never Used  . Alcohol use 4.2 oz/week    7 Standard drinks or equivalent per week  .  Drug use: No  . Sexual activity: Not on file    ROS Constitutional: Denies fever, chills, weight loss/gain, headaches, insomnia,  night sweats or change in appetite. Does c/o fatigue. Eyes: Denies redness, blurred vision, diplopia, discharge, itchy or watery eyes.  ENT: Denies discharge, congestion, post nasal drip, epistaxis, sore throat, earache, hearing loss, dental pain, Tinnitus, Vertigo, Sinus pain or snoring.  Cardio: Denies chest pain, palpitations, irregular heartbeat, syncope, dyspnea, diaphoresis, orthopnea, PND, claudication or edema Respiratory: denies cough, dyspnea, DOE, pleurisy, hoarseness, laryngitis or wheezing.  Gastrointestinal: Denies dysphagia, heartburn, reflux, water brash, pain, cramps, nausea, vomiting, bloating, diarrhea, constipation, hematemesis, melena, hematochezia, jaundice or hemorrhoids Genitourinary: Denies dysuria, frequency, urgency, nocturia, hesitancy, discharge, hematuria or flank pain Musculoskeletal: Denies arthralgia, myalgia, stiffness, Jt. Swelling, pain, limp or strain/sprain. Denies Falls. Skin: Denies puritis, rash, hives, warts, acne, eczema or change in skin lesion Neuro: No weakness, tremor, incoordination, spasms, paresthesia or pain Psychiatric: Denies confusion, memory loss or sensory loss. Denies Depression. Endocrine: Denies change in weight, skin, hair change, nocturia, and paresthesia, diabetic polys, visual blurring or hyper / hypo glycemic episodes.  Heme/Lymph: No excessive bleeding, bruising or enlarged lymph nodes.  Physical Exam  BP 122/76   Pulse 64   Temp (!) 97 F (36.1 C)   Resp 18   Ht 6' 1.5" (1.867 m)   Wt 211 lb 6.4 oz (95.9 kg)   BMI 27.51 kg/m   General Appearance: Well nourished and well groomed and in no apparent distress.  Eyes: PERRLA, EOMs, conjunctiva no swelling or erythema, normal fundi and vessels. Sinuses: No frontal/maxillary tenderness ENT/Mouth: EACs patent / TMs  nl. Nares clear without  erythema, swelling, mucoid exudates. Oral hygiene is good. No erythema, swelling, or exudate. Tongue normal, non-obstructing. Tonsils not swollen or erythematous. Hearing normal.  Neck: Supple, thyroid normal. No bruits, nodes or JVD. Respiratory: Respiratory effort normal.  BS equal and clear bilateral without rales, rhonci, wheezing or stridor. Cardio: Heart sounds are normal with regular rate and rhythm and no murmurs, rubs or gallops. Peripheral pulses are normal and equal bilaterally without edema. No aortic or femoral bruits. Chest: symmetric with normal excursions and percussion.  Abdomen: Soft, with Nl bowel sounds. Nontender, no guarding, rebound, hernias, masses, or organomegaly.  Lymphatics: Non tender without lymphadenopathy.  Genitourinary: No hernias.Testes nl. DRE - prostate nl for age - smooth & firm w/o nodules. Musculoskeletal: Full ROM all peripheral extremities, joint stability, 5/5 strength, and normal gait. Skin: Warm and dry without rashes, lesions, cyanosis, clubbing or  ecchymosis.  Neuro: Cranial nerves intact, reflexes equal bilaterally. Normal muscle tone, no cerebellar symptoms. Sensation intact.  Pysch: Alert and  oriented X 3 with normal affect, insight and judgment appropriate.   Assessment and Plan  1. Annual Preventative/Screening Exam   2. Essential hypertension  - EKG 12-Lead - US, RETROPERITNL ABD,  LTD - Urinalysis, Routine w reflex microscopic - Microalbumin / creatinine urine ratio - CBC with Differential/Platelet - BASIC METABOLIC PANEL WITH GFR - Magnesium - TSH  3. Hyperlipidemia, mixed  - EKG 12-Lead - US, RETROPERITNL ABD,  LTD - Hepatic function panel - Lipid panel - TSH  4. Prediabetes  - EKG 12-Lead - US, RETROPERITNL ABD,  LTD - Hemoglobin A1c - Insulin, random  5. Vitamin D deficiency  - VITAMIN D 25 Hydroxy   6. Testosterone deficiency  - Testosterone  7. Idiopathic gout  - Uric acid  8. Gastroesophageal reflux  disease   9. Screening for rectal cancer  - POC Hemoccult Bld/Stl  10. Prostate cancer screening  - PSA  11. Screening for ischemic heart disease  - EKG 12-Lead  12. Screening for AAA (aortic abdominal aneurysm)  - US, RETROPERITNL ABD,  LTD  13. Other fatigue  - Vitamin B12 - Iron and TIBC - Testosterone  14. Medication management  - Uric acid - CBC with Differential/Platelet - BASIC METABOLIC PANEL WITH GFR - Hepatic function panel - Magnesium - Lipid panel - TSH - Hemoglobin A1c - Insulin, random - VITAMIN D 25 Hydroxy         Patient was counseled in prudent diet, weight control to achieve/maintain BMI less than 25, BP monitoring, regular exercise and medications as discussed.  Discussed med effects and SE's. Routine screening labs and tests as requested with regular follow-up as recommended. Over 40 minutes of exam, counseling, chart review and high complex critical decision making was performed

## 2017-03-14 LAB — URINALYSIS, ROUTINE W REFLEX MICROSCOPIC
Bilirubin Urine: NEGATIVE
Glucose, UA: NEGATIVE
Hgb urine dipstick: NEGATIVE
KETONES UR: NEGATIVE
NITRITE: NEGATIVE
PH: 7.5 (ref 5.0–8.0)
Protein, ur: NEGATIVE
SPECIFIC GRAVITY, URINE: 1.007 (ref 1.001–1.035)

## 2017-03-14 LAB — PSA: PSA: 0.7 ng/mL (ref <=4.0)

## 2017-03-14 LAB — BASIC METABOLIC PANEL WITH GFR
BUN: 11 mg/dL (ref 7–25)
CHLORIDE: 103 mmol/L (ref 98–110)
CO2: 23 mmol/L (ref 20–32)
CREATININE: 0.91 mg/dL (ref 0.70–1.25)
Calcium: 9.6 mg/dL (ref 8.6–10.3)
GFR, Est Non African American: 89 mL/min (ref >=60)
Glucose, Bld: 85 mg/dL (ref 65–99)
Potassium: 4.3 mmol/L (ref 3.5–5.3)
Sodium: 140 mmol/L (ref 135–146)

## 2017-03-14 LAB — HEPATIC FUNCTION PANEL
ALT: 26 U/L (ref 9–46)
AST: 21 U/L (ref 10–35)
Albumin: 4.7 g/dL (ref 3.6–5.1)
Alkaline Phosphatase: 48 U/L (ref 40–115)
BILIRUBIN DIRECT: 0.1 mg/dL (ref <=0.2)
BILIRUBIN INDIRECT: 0.3 mg/dL (ref 0.2–1.2)
BILIRUBIN TOTAL: 0.4 mg/dL (ref 0.2–1.2)
Total Protein: 6.6 g/dL (ref 6.1–8.1)

## 2017-03-14 LAB — URINALYSIS, MICROSCOPIC ONLY
Bacteria, UA: NONE SEEN [HPF]
CASTS: NONE SEEN [LPF]
CRYSTALS: NONE SEEN [HPF]
RBC / HPF: NONE SEEN RBC/HPF (ref <=2)
Squamous Epithelial / LPF: NONE SEEN [HPF] (ref <=5)
YEAST: NONE SEEN [HPF]

## 2017-03-14 LAB — MICROALBUMIN / CREATININE URINE RATIO
CREATININE, URINE: 47 mg/dL (ref 20–370)
MICROALB UR: 0.2 mg/dL
MICROALB/CREAT RATIO: 4 ug/mg{creat} (ref <30)

## 2017-03-14 LAB — INSULIN, RANDOM: Insulin: 5.9 u[IU]/mL (ref 2.0–19.6)

## 2017-03-14 LAB — IRON AND TIBC
%SAT: 44 % (ref 15–60)
IRON: 146 ug/dL (ref 50–180)
TIBC: 334 ug/dL (ref 250–425)
UIBC: 188 ug/dL

## 2017-03-14 LAB — HEMOGLOBIN A1C
Hgb A1c MFr Bld: 4.9 % (ref <5.7)
Mean Plasma Glucose: 94 mg/dL

## 2017-03-14 LAB — LIPID PANEL
CHOL/HDL RATIO: 2.9 ratio (ref <5.0)
CHOLESTEROL: 185 mg/dL (ref <200)
HDL: 63 mg/dL (ref >40)
LDL Cholesterol: 71 mg/dL (ref <100)
TRIGLYCERIDES: 257 mg/dL — AB (ref <150)
VLDL: 51 mg/dL — ABNORMAL HIGH (ref <30)

## 2017-03-14 LAB — MAGNESIUM: MAGNESIUM: 2 mg/dL (ref 1.5–2.5)

## 2017-03-14 LAB — VITAMIN B12: VITAMIN B 12: 494 pg/mL (ref 200–1100)

## 2017-03-14 LAB — TSH: TSH: 1.16 mIU/L (ref 0.40–4.50)

## 2017-03-14 LAB — VITAMIN D 25 HYDROXY (VIT D DEFICIENCY, FRACTURES): VIT D 25 HYDROXY: 76 ng/mL (ref 30–100)

## 2017-03-14 LAB — TESTOSTERONE: TESTOSTERONE: 217 ng/dL — AB (ref 250–827)

## 2017-03-14 LAB — URIC ACID: Uric Acid, Serum: 7.1 mg/dL (ref 4.0–8.0)

## 2017-03-26 ENCOUNTER — Encounter: Payer: Self-pay | Admitting: Internal Medicine

## 2017-03-26 ENCOUNTER — Other Ambulatory Visit: Payer: Self-pay | Admitting: Internal Medicine

## 2017-03-26 DIAGNOSIS — I2089 Other forms of angina pectoris: Secondary | ICD-10-CM

## 2017-03-26 DIAGNOSIS — I208 Other forms of angina pectoris: Secondary | ICD-10-CM

## 2017-04-06 ENCOUNTER — Encounter: Payer: Self-pay | Admitting: Internal Medicine

## 2017-04-06 ENCOUNTER — Ambulatory Visit
Admission: RE | Admit: 2017-04-06 | Discharge: 2017-04-06 | Disposition: A | Discharge status: Home / Self Care | Payer: 59 | Source: Ambulatory Visit | Attending: Internal Medicine | Admitting: Internal Medicine

## 2017-04-06 ENCOUNTER — Ambulatory Visit: Payer: BLUE CROSS/BLUE SHIELD | Admitting: Internal Medicine

## 2017-04-06 ENCOUNTER — Ambulatory Visit (INDEPENDENT_AMBULATORY_CARE_PROVIDER_SITE_OTHER): Payer: 59 | Admitting: Internal Medicine

## 2017-04-06 VITALS — BP 102/62 | HR 61 | Ht 74.0 in | Wt 207.0 lb

## 2017-04-06 DIAGNOSIS — R5383 Other fatigue: Secondary | ICD-10-CM

## 2017-04-06 DIAGNOSIS — D689 Coagulation defect, unspecified: Secondary | ICD-10-CM

## 2017-04-06 DIAGNOSIS — E782 Mixed hyperlipidemia: Secondary | ICD-10-CM | POA: Diagnosis not present

## 2017-04-06 DIAGNOSIS — I200 Unstable angina: Secondary | ICD-10-CM | POA: Diagnosis not present

## 2017-04-06 DIAGNOSIS — Z01818 Encounter for other preprocedural examination: Secondary | ICD-10-CM

## 2017-04-06 DIAGNOSIS — Z01812 Encounter for preprocedural laboratory examination: Secondary | ICD-10-CM

## 2017-04-06 DIAGNOSIS — I10 Essential (primary) hypertension: Secondary | ICD-10-CM

## 2017-04-06 NOTE — Patient Instructions (Signed)
   [facility] [address] [place] [address] Dept: [phone] Loc: [phone]  Cory CelesteDavid C Carrillo  04/06/2017  You are scheduled for a Cardiac Catheterization on Wednesday, September 12 with Dr. Verdis PrimeHenry Smith.  1. [facility] at [facility]: [address] [place], [place] [address] at 11:00 AM (two hours before your procedure to ensure your preparation). Free valet parking service is available.   Special note: Every effort is made to have your procedure done on time. Please understand that emergencies sometimes delay scheduled procedures.  2. Diet: Do not eat or drink anything after midnight prior to your procedure except sips of water to take medications.  3. Labs & Chest X-Ray: You will have lab work done today 04/06/17  -- [facility] ([address])  -- no appointment needed  4. Medication instructions in preparation for your procedure:  On the morning of your procedure, take your Aspirin and any morning medicines NOT listed above.  You may use sips of water.  5. Plan for one night stay--bring personal belongings. 6. Bring a current list of your medications and current insurance cards. 7. You MUST have a responsible person to drive you home. 8. Someone MUST be with you the first 24 hours after you arrive home or your discharge will be delayed. 9. Please wear clothes that are easy to get on and off and wear slip-on shoes.  Thank you for allowing us to care for you!   -- [facility]

## 2017-04-07 ENCOUNTER — Other Ambulatory Visit: Payer: Self-pay | Admitting: Internal Medicine

## 2017-04-07 DIAGNOSIS — I10 Essential (primary) hypertension: Secondary | ICD-10-CM

## 2017-04-07 LAB — BASIC METABOLIC PANEL
BUN / CREAT RATIO: 19 (ref 10–24)
BUN: 18 mg/dL (ref 8–27)
CO2: 22 mmol/L (ref 20–29)
CREATININE: 0.93 mg/dL (ref 0.76–1.27)
Calcium: 10 mg/dL (ref 8.6–10.2)
Chloride: 101 mmol/L (ref 96–106)
GFR calc non Af Amer: 86 mL/min/{1.73_m2} (ref >59)
GFR, EST AFRICAN AMERICAN: 100 mL/min/{1.73_m2} (ref >59)
Glucose: 85 mg/dL (ref 65–99)
Potassium: 4.9 mmol/L (ref 3.5–5.2)
SODIUM: 142 mmol/L (ref 134–144)

## 2017-04-07 LAB — PROTIME-INR
INR: 1 (ref 0.8–1.2)
Prothrombin Time: 10.6 s (ref 9.1–12.0)

## 2017-04-07 LAB — CBC
HEMATOCRIT: 47.4 % (ref 37.5–51.0)
Hemoglobin: 16.5 g/dL (ref 13.0–17.7)
MCH: 31.2 pg (ref 26.6–33.0)
MCHC: 34.8 g/dL (ref 31.5–35.7)
MCV: 90 fL (ref 79–97)
PLATELETS: 155 10*3/uL (ref 150–379)
RBC: 5.29 x10E6/uL (ref 4.14–5.80)
RDW: 13.8 % (ref 12.3–15.4)
WBC: 4.7 10*3/uL (ref 3.4–10.8)

## 2017-04-07 LAB — TSH: TSH: 0.926 u[IU]/mL (ref 0.450–4.500)

## 2017-04-07 LAB — APTT: aPTT: 26 s (ref 24–33)

## 2017-04-09 DIAGNOSIS — I2510 Atherosclerotic heart disease of native coronary artery without angina pectoris: Secondary | ICD-10-CM | POA: Insufficient documentation

## 2017-04-09 DIAGNOSIS — I200 Unstable angina: Principal | ICD-10-CM

## 2017-04-09 NOTE — Progress Notes (Signed)
OFFICE CONSULT NOTE  Chief Complaint:  Chest tightness with exertion  Primary Care Physician: Lucky Cowboy, MD  HPI:  Cory Carrillo is a 66 y.o. male who is being seen today for the evaluation of chest tightness at the request of Lucky Cowboy, MD. Mr. Maia Plan is currently referred for evaluation of exertional chest tightness. His family history significant for heart disease in his father who died at age 79, a brother who had a heart attack at age 60, a grandmother who had a stroke at age 86, and personal risk factors including hypertension, dyslipidemia and minimal physical activity. He is also former smoker and quit in 2000. He reports several episodes of chest tightness with exertion. This is associated with some substernal chest discomfort that radiates up to the neck and jaw. These episodes seem to improve fairly quickly with rest. He describes it as an achy feeling, but not really pain. He was advised to decrease his exercise and start on aspirin. EKG performed today demonstrates normal sinus rhythm at 61 without ischemic changes.  PMHx:  Past Medical History:  Diagnosis Date  . Anxiety   . Cervical stenosis of spine   . Depression   . GERD (gastroesophageal reflux disease)   . Glaucoma   . Gout   . Hyperlipidemia   . Hypertension   . Hypogonadism male   . Prediabetes   . Vitamin D deficiency     No past surgical history on file.  FAMHx:  Family History  Problem Relation Age of Onset  . Cancer Mother        colon  . Heart disease Father   . Heart disease Brother   . Hypertension Brother   . Heart disease Paternal Grandmother   . Stroke Paternal Grandmother   . Stroke Paternal Grandfather   . Healthy Son     SOCHx:   reports that he quit smoking about 10 years ago. He smoked 1.50 packs per day. He has never used smokeless tobacco. He reports that he drinks about 4.2 oz of alcohol per week . He reports that he does not use drugs.  ALLERGIES:  Allergies    Allergen Reactions  . Ace Inhibitors     cough  . Other Itching    Synthetic opiods  Full Body itching    ROS: Pertinent items noted in HPI and remainder of comprehensive ROS otherwise negative.  HOME MEDS: Current Outpatient Prescriptions on File Prior to Visit  Medication Sig Dispense Refill  . aspirin 81 MG tablet Take 81 mg by mouth daily.    . Cholecalciferol (VITAMIN D PO) Take 8,000 Units by mouth daily.     Marland Kitchen latanoprost (XALATAN) 0.005 % ophthalmic solution Place 1 drop into both eyes at bedtime.    . Multiple Vitamins-Minerals (MULTIVITAMIN PO) Take 1 tablet by mouth daily.     . tadalafil (CIALIS) 20 MG tablet Take 1 tablet (20 mg total) by mouth daily as needed for erectile dysfunction. 30 tablet 8  . testosterone cypionate (DEPOTESTOSTERONE CYPIONATE) 200 MG/ML injection INJECT 1.5 ML TO 2 ML INTO MUSCLE EVERY 2 WEEKS (Patient taking differently: INJECT INTO MUSCLE EVERY 2 WEEKS) 10 mL 2   No current facility-administered medications on file prior to visit.     LABS/IMAGING: No results found for this or any previous visit (from the past 48 hour(s)). No results found.  LIPID PANEL:    Component Value Date/Time   CHOL 185 03/13/2017 1525   TRIG 257 (H) 03/13/2017  1525   HDL 63 03/13/2017 1525   CHOLHDL 2.9 03/13/2017 1525   VLDL 51 (H) 03/13/2017 1525   LDLCALC 71 03/13/2017 1525    WEIGHTS: Wt Readings from Last 3 Encounters:  04/06/17 207 lb (93.9 kg)  03/13/17 211 lb 6.4 oz (95.9 kg)  01/01/17 204 lb 12.8 oz (92.9 kg)    VITALS: BP 102/62   Pulse 61   Ht 6\' 2"  (1.88 m)   Wt 207 lb (93.9 kg)   BMI 26.58 kg/m   EXAM: General appearance: alert and no distress Neck: no carotid bruit, no JVD and thyroid not enlarged, symmetric, no tenderness/mass/nodules Lungs: clear to auscultation bilaterally Heart: regular rate and rhythm, S1, S2 normal, no murmur, click, rub or gallop Abdomen: soft, non-tender; bowel sounds normal; no masses,  no  organomegaly Extremities: extremities normal, atraumatic, no cyanosis or edema Pulses: 2+ and symmetric Skin: Skin color, texture, turgor normal. No rashes or lesions Neurologic: Grossly normal Psych: Pleasant  EKG: Normal sinus rhythm at 61- personally reviewed  ASSESSMENT: 1. Unstable angina 2. Dyslipidemia 3. Hypertension 4. Family history of coronary artery disease  PLAN: 1.   Mr. Ottis StainHuff is describing symptoms of unstable angina, with chest tightness that it's vague and dull which seems to worsen with exercise and is relieved quickly at rest. This also seems to be associated with achiness in the jaw and teeth, which sounds like a cardinal symptom of angina. I discussed many options as far as cardiovascular workup including noninvasive stress testing or left heart catheterization. Based on the higher pretest probability of chest pain, I would advise direct left heart catheterization. I discussed the risks, benefits and alternatives of heart catheterization today with him and he is agreeable to proceed. We've arranged this with Dr. Verdis PrimeHenry Smith on 04/11/2017. Follow-up with me afterwards.  Thanks for the kind referral.  Chrystie NoseKenneth C. Livia Tarr, MD, [facility]  [place]  [facility]  Attending Cardiologist  Direct Dial: [phone]  Fax: [phone]  Website:  [email or web address]  Jade Burright C Preston Garabedian 04/09/2017, 4:38 PM

## 2017-04-10 ENCOUNTER — Telehealth: Payer: Self-pay

## 2017-04-10 NOTE — Telephone Encounter (Signed)
Patient [facility] scheduled for:  04/11/2017 @ 1300 Verified arrival time and place:  NT @ 1100 Confirmed AM meds to be taken pre-cath with sip of water: Take ASA Confirmed patient has responsible person to drive home post procedure and observe patient for 24 hours:  yes Addl concerns:

## 2017-04-11 ENCOUNTER — Ambulatory Visit (HOSPITAL_COMMUNITY)
Admission: RE | Admit: 2017-04-11 | Discharge: 2017-04-11 | Disposition: A | Discharge status: Home / Self Care | Payer: 59 | Source: Ambulatory Visit | Attending: Interventional Cardiology | Admitting: Interventional Cardiology

## 2017-04-11 ENCOUNTER — Encounter (HOSPITAL_COMMUNITY)
Admission: RE | Disposition: A | Discharge status: Home / Self Care | Payer: Self-pay | Source: Ambulatory Visit | Attending: Interventional Cardiology

## 2017-04-11 DIAGNOSIS — I2584 Coronary atherosclerosis due to calcified coronary lesion: Secondary | ICD-10-CM | POA: Insufficient documentation

## 2017-04-11 DIAGNOSIS — Z8249 Family history of ischemic heart disease and other diseases of the circulatory system: Secondary | ICD-10-CM | POA: Diagnosis not present

## 2017-04-11 DIAGNOSIS — F329 Major depressive disorder, single episode, unspecified: Secondary | ICD-10-CM | POA: Diagnosis not present

## 2017-04-11 DIAGNOSIS — I10 Essential (primary) hypertension: Secondary | ICD-10-CM | POA: Diagnosis present

## 2017-04-11 DIAGNOSIS — I25119 Atherosclerotic heart disease of native coronary artery with unspecified angina pectoris: Secondary | ICD-10-CM | POA: Diagnosis not present

## 2017-04-11 DIAGNOSIS — E559 Vitamin D deficiency, unspecified: Secondary | ICD-10-CM | POA: Insufficient documentation

## 2017-04-11 DIAGNOSIS — H409 Unspecified glaucoma: Secondary | ICD-10-CM | POA: Diagnosis not present

## 2017-04-11 DIAGNOSIS — M109 Gout, unspecified: Secondary | ICD-10-CM | POA: Insufficient documentation

## 2017-04-11 DIAGNOSIS — I2510 Atherosclerotic heart disease of native coronary artery without angina pectoris: Secondary | ICD-10-CM | POA: Diagnosis present

## 2017-04-11 DIAGNOSIS — I200 Unstable angina: Secondary | ICD-10-CM

## 2017-04-11 DIAGNOSIS — I25110 Atherosclerotic heart disease of native coronary artery with unstable angina pectoris: Secondary | ICD-10-CM | POA: Insufficient documentation

## 2017-04-11 DIAGNOSIS — Z7982 Long term (current) use of aspirin: Secondary | ICD-10-CM | POA: Diagnosis not present

## 2017-04-11 DIAGNOSIS — Z87891 Personal history of nicotine dependence: Secondary | ICD-10-CM | POA: Diagnosis not present

## 2017-04-11 DIAGNOSIS — F419 Anxiety disorder, unspecified: Secondary | ICD-10-CM | POA: Insufficient documentation

## 2017-04-11 DIAGNOSIS — R7309 Other abnormal glucose: Secondary | ICD-10-CM | POA: Diagnosis present

## 2017-04-11 DIAGNOSIS — R7303 Prediabetes: Secondary | ICD-10-CM | POA: Insufficient documentation

## 2017-04-11 DIAGNOSIS — K219 Gastro-esophageal reflux disease without esophagitis: Secondary | ICD-10-CM | POA: Insufficient documentation

## 2017-04-11 DIAGNOSIS — E782 Mixed hyperlipidemia: Secondary | ICD-10-CM | POA: Diagnosis present

## 2017-04-11 DIAGNOSIS — E785 Hyperlipidemia, unspecified: Secondary | ICD-10-CM | POA: Insufficient documentation

## 2017-04-11 HISTORY — PX: LEFT HEART CATH AND CORONARY ANGIOGRAPHY: CATH118249

## 2017-04-11 SURGERY — LEFT HEART CATH AND CORONARY ANGIOGRAPHY
Anesthesia: LOCAL

## 2017-04-11 MED ORDER — SODIUM CHLORIDE 0.9 % IV SOLN
INTRAVENOUS | Status: DC
  Administered 2017-04-11: 13:00:00 via INTRAVENOUS

## 2017-04-11 MED ORDER — HEPARIN SODIUM (PORCINE) 1000 UNIT/ML IJ SOLN
INTRAMUSCULAR | Status: AC
  Filled 2017-04-11: qty 1

## 2017-04-11 MED ORDER — MIDAZOLAM HCL 2 MG/2ML IJ SOLN
INTRAMUSCULAR | Status: AC
  Filled 2017-04-11: qty 2

## 2017-04-11 MED ORDER — LIDOCAINE HCL (CARDIAC) 20 MG/ML IV SOLN
INTRAVENOUS | Status: AC
  Filled 2017-04-11: qty 5

## 2017-04-11 MED ORDER — SODIUM CHLORIDE 0.9% FLUSH: 3.0000 mL | INTRAVENOUS | Status: DC | PRN

## 2017-04-11 MED ORDER — HEPARIN (PORCINE) IN NACL 2-0.9 UNIT/ML-% IJ SOLN
INTRAMUSCULAR | Status: AC
  Filled 2017-04-11: qty 1000

## 2017-04-11 MED ORDER — ONDANSETRON HCL 4 MG/2ML IJ SOLN: 4.0000 mg | Freq: Four times a day (QID) | INTRAMUSCULAR | Status: DC | PRN

## 2017-04-11 MED ORDER — ASPIRIN 81 MG PO CHEW
CHEWABLE_TABLET | ORAL | Status: AC
  Filled 2017-04-11: qty 1

## 2017-04-11 MED ORDER — ASPIRIN 81 MG PO CHEW: 81.0000 mg | CHEWABLE_TABLET | Freq: Every day | ORAL | Status: DC

## 2017-04-11 MED ORDER — IOPAMIDOL (ISOVUE-370) INJECTION 76%
INTRAVENOUS | Status: DC | PRN
  Administered 2017-04-11: 60 mL via INTRA_ARTERIAL

## 2017-04-11 MED ORDER — HEPARIN (PORCINE) IN NACL 2-0.9 UNIT/ML-% IJ SOLN
INTRAMUSCULAR | Status: DC | PRN
  Administered 2017-04-11: 10 mL via INTRA_ARTERIAL

## 2017-04-11 MED ORDER — HEPARIN SODIUM (PORCINE) 1000 UNIT/ML IJ SOLN
INTRAMUSCULAR | Status: DC | PRN
  Administered 2017-04-11: 5000 [IU] via INTRAVENOUS

## 2017-04-11 MED ORDER — MIDAZOLAM HCL 2 MG/2ML IJ SOLN
INTRAMUSCULAR | Status: DC | PRN
  Administered 2017-04-11 (×3): 1 mg via INTRAVENOUS

## 2017-04-11 MED ORDER — SODIUM CHLORIDE 0.9 % IV SOLN: INTRAVENOUS | Status: DC

## 2017-04-11 MED ORDER — ACETAMINOPHEN 325 MG PO TABS: 650.0000 mg | ORAL_TABLET | ORAL | Status: DC | PRN

## 2017-04-11 MED ORDER — LIDOCAINE HCL (PF) 1 % IJ SOLN
INTRAMUSCULAR | Status: DC | PRN
  Administered 2017-04-11: 2 mL via INTRADERMAL

## 2017-04-11 MED ORDER — SODIUM CHLORIDE 0.9% FLUSH: 3.0000 mL | Freq: Two times a day (BID) | INTRAVENOUS | Status: DC

## 2017-04-11 MED ORDER — FENTANYL CITRATE (PF) 100 MCG/2ML IJ SOLN
INTRAMUSCULAR | Status: AC
  Filled 2017-04-11: qty 2

## 2017-04-11 MED ORDER — VERAPAMIL HCL 2.5 MG/ML IV SOLN
INTRAVENOUS | Status: AC
  Filled 2017-04-11: qty 2

## 2017-04-11 MED ORDER — FENTANYL CITRATE (PF) 100 MCG/2ML IJ SOLN
INTRAMUSCULAR | Status: DC | PRN
  Administered 2017-04-11 (×2): 50 ug via INTRAVENOUS

## 2017-04-11 MED ORDER — OXYCODONE HCL 5 MG PO TABS: 5.0000 mg | ORAL_TABLET | ORAL | Status: DC | PRN

## 2017-04-11 MED ORDER — ASPIRIN 81 MG PO CHEW: 81.0000 mg | CHEWABLE_TABLET | ORAL | Status: DC

## 2017-04-11 MED ORDER — SODIUM CHLORIDE 0.9 % IV SOLN: 250.0000 mL | INTRAVENOUS | Status: DC | PRN

## 2017-04-11 MED ORDER — HEPARIN (PORCINE) IN NACL 2-0.9 UNIT/ML-% IJ SOLN
INTRAMUSCULAR | Status: AC | PRN
  Administered 2017-04-11: 1000 mL

## 2017-04-11 SURGICAL SUPPLY — 12 items
CATH EXPO 5F FL3.5 (CATHETERS) ×1 IMPLANT
CATH INFINITI JR4 5F (CATHETERS) ×1 IMPLANT
COVER PRB 48X5XTLSCP FOLD TPE (BAG) IMPLANT
COVER PROBE 5X48 (BAG) ×2
DEVICE RAD COMP TR BAND LRG (VASCULAR PRODUCTS) ×1 IMPLANT
GLIDESHEATH SLEND A-KIT 6F 22G (SHEATH) ×1 IMPLANT
GUIDEWIRE INQWIRE 1.5J.035X260 (WIRE) IMPLANT
INQWIRE 1.5J .035X260CM (WIRE) ×2
KIT HEART LEFT (KITS) ×2 IMPLANT
PACK CARDIAC CATHETERIZATION (CUSTOM PROCEDURE TRAY) ×2 IMPLANT
TRANSDUCER W/STOPCOCK (MISCELLANEOUS) ×2 IMPLANT
TUBING CIL FLEX 10 FLL-RA (TUBING) ×2 IMPLANT

## 2017-04-11 NOTE — CV Procedure (Signed)
   Widely patent coronary arteries. Calcification and luminal irregularities are noted in each of the 3 coronary territories.  Normal left ventricular size, function, and hemodynamics. EF 60%.

## 2017-04-11 NOTE — Research (Signed)
[facility] Study Informed Consent   Subject Name: Cory Carrillo  Subject met inclusion and exclusion criteria.  The informed consent form, study requirements and expectations were reviewed with the subject and questions and concerns were addressed prior to the signing of the consent form.  The subject verbalized understanding of the trial requirements.  The subject agreed to participate in the OPTIMIZE trial and signed the informed consent.  The informed consent was obtained prior to performance of any protocol-specific procedures for the subject.  A copy of the signed informed consent was given to the subject and a copy was placed in the subject's medical record.  Blossom Hoops 04/11/2017, 1:01 PM

## 2017-04-11 NOTE — H&P (View-Only) (Signed)
 OFFICE CONSULT NOTE  Chief Complaint:  Chest tightness with exertion  Primary Care Physician: McKeown, William, MD  HPI:  Cory Carrillo is a 66 y.o. male who is being seen today for the evaluation of chest tightness at the request of McKeown, William, MD. Mr. Hoff is currently referred for evaluation of exertional chest tightness. His family history significant for heart disease in his father who died at age 79, a brother who had a heart attack at age 60, a grandmother who had a stroke at age 50, and personal risk factors including hypertension, dyslipidemia and minimal physical activity. He is also former smoker and quit in 2000. He reports several episodes of chest tightness with exertion. This is associated with some substernal chest discomfort that radiates up to the neck and jaw. These episodes seem to improve fairly quickly with rest. He describes it as an achy feeling, but not really pain. He was advised to decrease his exercise and start on aspirin. EKG performed today demonstrates normal sinus rhythm at 61 without ischemic changes.  PMHx:  Past Medical History:  Diagnosis Date  . Anxiety   . Cervical stenosis of spine   . Depression   . GERD (gastroesophageal reflux disease)   . Glaucoma   . Gout   . Hyperlipidemia   . Hypertension   . Hypogonadism male   . Prediabetes   . Vitamin D deficiency     No past surgical history on file.  FAMHx:  Family History  Problem Relation Age of Onset  . Cancer Mother        colon  . Heart disease Father   . Heart disease Brother   . Hypertension Brother   . Heart disease Paternal Grandmother   . Stroke Paternal Grandmother   . Stroke Paternal Grandfather   . Healthy Son     SOCHx:   reports that he quit smoking about 10 years ago. He smoked 1.50 packs per day. He has never used smokeless tobacco. He reports that he drinks about 4.2 oz of alcohol per week . He reports that he does not use drugs.  ALLERGIES:  Allergies    Allergen Reactions  . Ace Inhibitors     cough  . Other Itching    Synthetic opiods  Full Body itching    ROS: Pertinent items noted in HPI and remainder of comprehensive ROS otherwise negative.  HOME MEDS: Current Outpatient Prescriptions on File Prior to Visit  Medication Sig Dispense Refill  . aspirin 81 MG tablet Take 81 mg by mouth daily.    . Cholecalciferol (VITAMIN D PO) Take 8,000 Units by mouth daily.     . latanoprost (XALATAN) 0.005 % ophthalmic solution Place 1 drop into both eyes at bedtime.    . Multiple Vitamins-Minerals (MULTIVITAMIN PO) Take 1 tablet by mouth daily.     . tadalafil (CIALIS) 20 MG tablet Take 1 tablet (20 mg total) by mouth daily as needed for erectile dysfunction. 30 tablet 8  . testosterone cypionate (DEPOTESTOSTERONE CYPIONATE) 200 MG/ML injection INJECT 1.5 ML TO 2 ML INTO MUSCLE EVERY 2 WEEKS (Patient taking differently: INJECT 1ML INTO MUSCLE EVERY 2 WEEKS) 10 mL 2   No current facility-administered medications on file prior to visit.     LABS/IMAGING: No results found for this or any previous visit (from the past 48 hour(s)). No results found.  LIPID PANEL:    Component Value Date/Time   CHOL 185 03/13/2017 1525   TRIG 257 (H) 03/13/2017   1525   HDL 63 03/13/2017 1525   CHOLHDL 2.9 03/13/2017 1525   VLDL 51 (H) 03/13/2017 1525   LDLCALC 71 03/13/2017 1525    WEIGHTS: Wt Readings from Last 3 Encounters:  04/06/17 207 lb (93.9 kg)  03/13/17 211 lb 6.4 oz (95.9 kg)  01/01/17 204 lb 12.8 oz (92.9 kg)    VITALS: BP 102/62   Pulse 61   Ht 6\' 2"  (1.88 m)   Wt 207 lb (93.9 kg)   BMI 26.58 kg/m   EXAM: General appearance: alert and no distress Neck: no carotid bruit, no JVD and thyroid not enlarged, symmetric, no tenderness/mass/nodules Lungs: clear to auscultation bilaterally Heart: regular rate and rhythm, S1, S2 normal, no murmur, click, rub or gallop Abdomen: soft, non-tender; bowel sounds normal; no masses,  no  organomegaly Extremities: extremities normal, atraumatic, no cyanosis or edema Pulses: 2+ and symmetric Skin: Skin color, texture, turgor normal. No rashes or lesions Neurologic: Grossly normal Psych: Pleasant  EKG: Normal sinus rhythm at 61- personally reviewed  ASSESSMENT: 1. Unstable angina 2. Dyslipidemia 3. Hypertension 4. Family history of coronary artery disease  PLAN: 1.   Mr. Ottis StainHuff is describing symptoms of unstable angina, with chest tightness that it's vague and dull which seems to worsen with exercise and is relieved quickly at rest. This also seems to be associated with achiness in the jaw and teeth, which sounds like a cardinal symptom of angina. I discussed many options as far as cardiovascular workup including noninvasive stress testing or left heart catheterization. Based on the higher pretest probability of chest pain, I would advise direct left heart catheterization. I discussed the risks, benefits and alternatives of heart catheterization today with him and he is agreeable to proceed. We've arranged this with Dr. Verdis PrimeHenry Smith on 04/11/2017. Follow-up with me afterwards.  Thanks for the kind referral.  Chrystie NoseKenneth C. Hilty, MD, [facility]  [place]  [facility]  Attending Cardiologist  Direct Dial: [phone]  Fax: [phone]  Website:  [email or web address]  Kenneth C Hilty 04/09/2017, 4:38 PM

## 2017-04-11 NOTE — Discharge Instructions (Signed)
Radial Site Care °Refer to this sheet in the next few weeks. These instructions provide you with information about caring for yourself after your procedure. Your health care provider may also give you more specific instructions. Your treatment has been planned according to current medical practices, but problems sometimes occur. Call your health care provider if you have any problems or questions after your procedure. °What can I expect after the procedure? °After your procedure, it is typical to have the following: °· Bruising at the radial site that usually fades within 1-2 weeks. °· Blood collecting in the tissue (hematoma) that may be painful to the touch. It should usually decrease in size and tenderness within 1-2 weeks. ° °Follow these instructions at home: °· Take medicines only as directed by your health care provider. °· You may shower 24-48 hours after the procedure or as directed by your health care provider. Remove the bandage (dressing) and gently wash the site with plain soap and water. Pat the area dry with a clean towel. Do not rub the site, because this may cause bleeding. °· Do not take baths, swim, or use a hot tub until your health care provider approves. °· Check your insertion site every day for redness, swelling, or drainage. °· Do not apply powder or lotion to the site. °· Do not flex or bend the affected arm for 24 hours or as directed by your health care provider. °· Do not push or pull heavy objects with the affected arm for 24 hours or as directed by your health care provider. °· Do not lift over 10 lb (4.5 kg) for 5 days after your procedure or as directed by your health care provider. °· Ask your health care provider when it is okay to: °? Return to work or school. °? Resume usual physical activities or sports. °? Resume sexual activity. °· Do not drive home if you are discharged the same day as the procedure. Have someone else drive you. °· You may drive 24 hours after the procedure  unless otherwise instructed by your health care provider. °· Do not operate machinery or power tools for 24 hours after the procedure. °· If your procedure was done as an outpatient procedure, which means that you went home the same day as your procedure, a responsible adult should be with you for the first 24 hours after you arrive home. °· Keep all follow-up visits as directed by your health care provider. This is important. °Contact a health care provider if: °· You have a fever. °· You have chills. °· You have increased bleeding from the radial site. Hold pressure on the site. °Get help right away if: °· You have unusual pain at the radial site. °· You have redness, warmth, or swelling at the radial site. °· You have drainage (other than a small amount of blood on the dressing) from the radial site. °· The radial site is bleeding, and the bleeding does not stop after 30 minutes of holding steady pressure on the site. °· Your arm or hand becomes pale, cool, tingly, or numb. °This information is not intended to replace advice given to you by your health care provider. Make sure you discuss any questions you have with your health care provider. °Document Released: 08/19/2010 Document Revised: 12/23/2015 Document Reviewed: 02/02/2014 °Elsevier Interactive Patient Education © 2018 [employer] ° ° ° °Moderate Conscious Sedation, Adult, Care After °These instructions provide you with information about caring for yourself after your procedure. Your health care provider   may also give you more specific instructions. Your treatment has been planned according to current medical practices, but problems sometimes occur. Call your health care provider if you have any problems or questions after your procedure. °What can I expect after the procedure? °After your procedure, it is common: °· To feel sleepy for several hours. °· To feel clumsy and have poor balance for several hours. °· To have poor judgment for several  hours. °· To vomit if you eat too soon. ° °Follow these instructions at home: °For at least 24 hours after the procedure: ° °· Do not: °? Participate in activities where you could fall or become injured. °? Drive. °? Use heavy machinery. °? Drink alcohol. °? Take sleeping pills or medicines that cause drowsiness. °? Make important decisions or sign legal documents. °? Take care of children on your own. °· Rest. °Eating and drinking °· Follow the diet recommended by your health care provider. °· If you vomit: °? Drink water, juice, or soup when you can drink without vomiting. °? Make sure you have little or no nausea before eating solid foods. °General instructions °· Have a responsible adult stay with you until you are awake and alert. °· Take over-the-counter and prescription medicines only as told by your health care provider. °· If you smoke, do not smoke without supervision. °· Keep all follow-up visits as told by your health care provider. This is important. °Contact a health care provider if: °· You keep feeling nauseous or you keep vomiting. °· You feel light-headed. °· You develop a rash. °· You have a fever. °Get help right away if: °· You have trouble breathing. °This information is not intended to replace advice given to you by your health care provider. Make sure you discuss any questions you have with your health care provider. °Document Released: 05/07/2013 Document Revised: 12/20/2015 Document Reviewed: 11/06/2015 °Elsevier Interactive Patient Education © 2018 [employer]vier Inc[employer] ° °

## 2017-04-11 NOTE — Interval H&P Note (Signed)
Cath Lab Visit (complete for each Cath Lab visit)  Clinical Evaluation Leading to the Procedure:   ACS: Yes.    Non-ACS:    Anginal Classification: CCS III  Anti-ischemic medical therapy: Minimal Therapy (1 class of medications)  Non-Invasive Test Results: No non-invasive testing performed  Prior CABG: No previous CABG      History and Physical Interval Note:  04/11/2017 1:45 PM  Cory Carrillo  has presented today for surgery, with the diagnosis of unstable angina  The various methods of treatment have been discussed with the patient and family. After consideration of risks, benefits and other options for treatment, the patient has consented to  Procedure(s): LEFT HEART CATH AND CORONARY ANGIOGRAPHY (N/A) as a surgical intervention .  The patient's history has been reviewed, patient examined, no change in status, stable for surgery.  I have reviewed the patient's chart and labs.  Questions were answered to the patient's satisfaction.     Lyn RecordsHenry W Smith III

## 2017-04-12 ENCOUNTER — Encounter (HOSPITAL_COMMUNITY): Payer: Self-pay | Admitting: Interventional Cardiology

## 2017-04-13 ENCOUNTER — Encounter: Payer: Self-pay | Admitting: Internal Medicine

## 2017-04-13 ENCOUNTER — Telehealth: Payer: Self-pay

## 2017-04-13 NOTE — Telephone Encounter (Signed)
Spoke to patient we received your email to [email or web address] out of office.Advised I will send email to him.Advised to follow instructions given to you at discharge.Advised to avoid exercise that causes angina.Advised to start walking gradually building up to 30 mins daily at least 5 x week.

## 2017-04-21 IMAGING — CR DG NECK SOFT TISSUE
2 series · 2 of 2 positions shown · non-contrast
Comparison: MR 03/23/2011

CLINICAL DATA: Bilateral neck lymph node swelling and tenderness (
under jaw) for 2 weeks.

EXAM:
NECK SOFT TISSUES - 1+ VIEW

[neck ap]
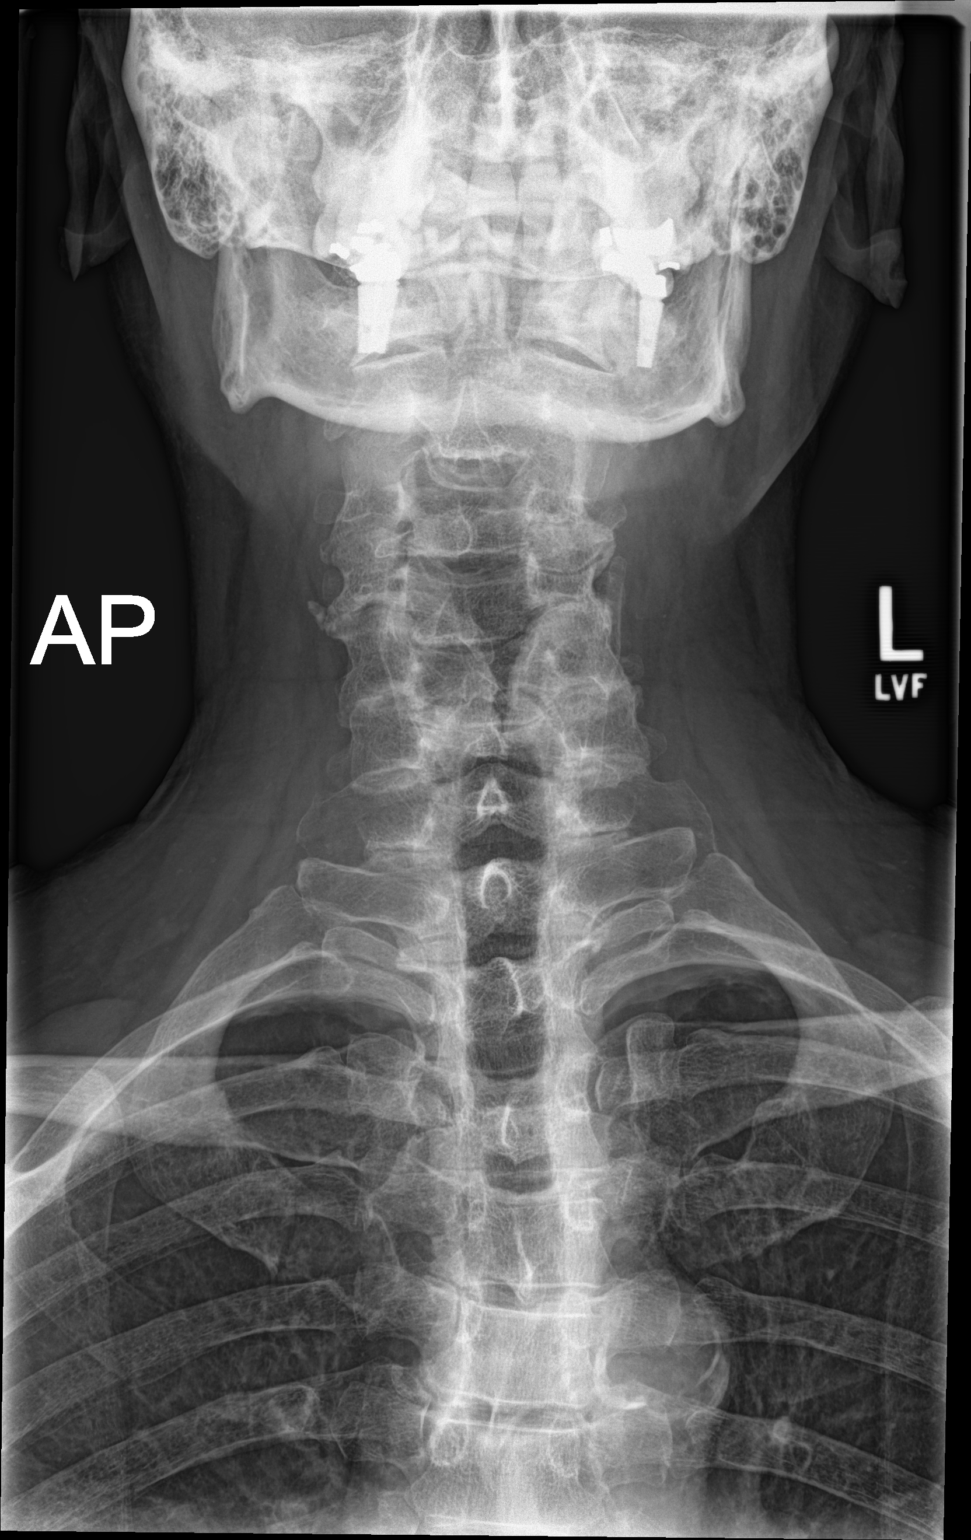

[neck lat]
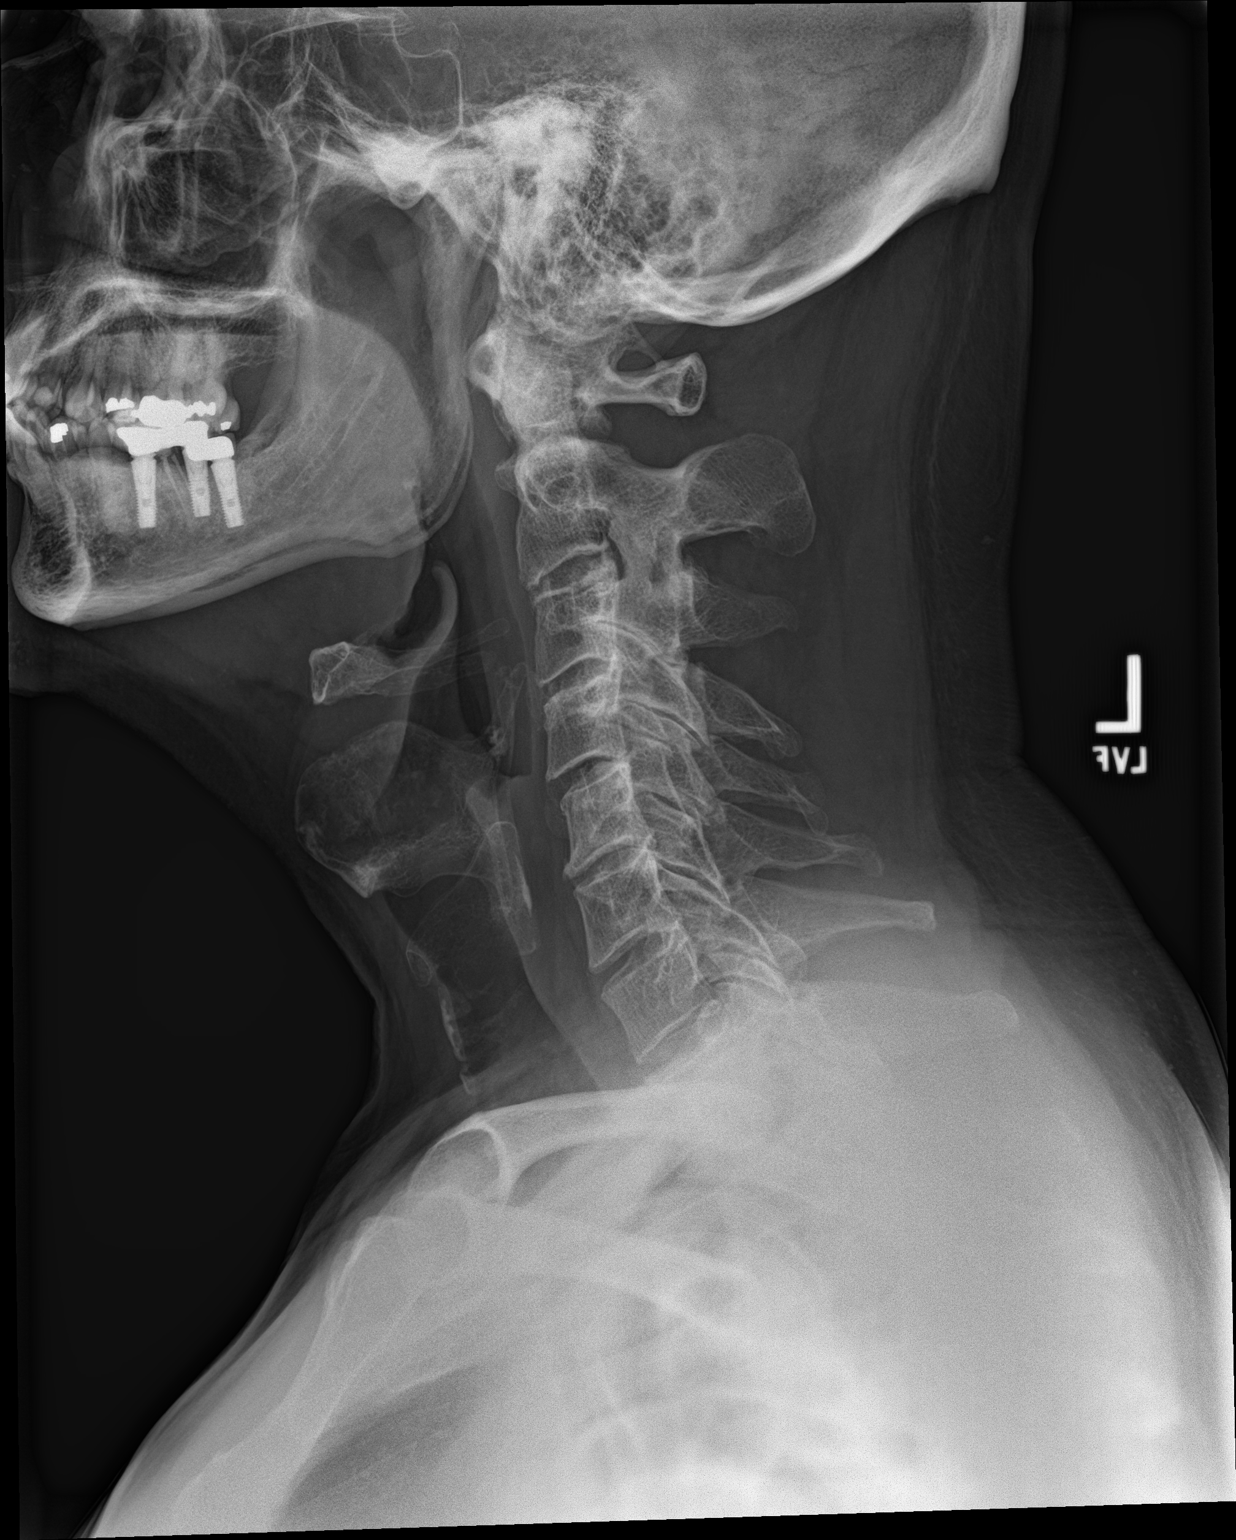

[2 of 2 positions shown; findings below may reference images not displayed]

FINDINGS: There is no evidence of retropharyngeal soft tissue swelling or
epiglottic enlargement. The cervical airway is unremarkable and no
radio-opaque foreign body identified. Multiple dental restorations.
Mild spondylitic changes in the cervical spine. Atheromatous aorta.
IMPRESSION: Negative neck soft tissues.

## 2017-04-27 ENCOUNTER — Encounter: Payer: Self-pay | Admitting: Internal Medicine

## 2017-04-27 ENCOUNTER — Ambulatory Visit (INDEPENDENT_AMBULATORY_CARE_PROVIDER_SITE_OTHER): Payer: 59 | Admitting: Internal Medicine

## 2017-04-27 VITALS — BP 144/83 | HR 50 | Ht 74.0 in | Wt 209.2 lb

## 2017-04-27 DIAGNOSIS — I10 Essential (primary) hypertension: Secondary | ICD-10-CM

## 2017-04-27 DIAGNOSIS — I200 Unstable angina: Secondary | ICD-10-CM

## 2017-04-27 DIAGNOSIS — E782 Mixed hyperlipidemia: Secondary | ICD-10-CM

## 2017-04-27 MED ORDER — ATORVASTATIN CALCIUM 20 MG PO TABS: 20.0000 mg | ORAL_TABLET | Freq: Every day | ORAL | 3 refills | Status: DC

## 2017-04-27 NOTE — Patient Instructions (Signed)
Your physician has recommended you make the following change in your medication:  -- INCREASE atorvastatin to every day  Your physician recommends that you return for lab work in THREE MONTHS (fasting)  Your physician wants you to follow-up in: ONE YEAR with Dr. Rennis Golden. You will receive a reminder letter in the mail two months in advance. If you don't receive a letter, please call our office to schedule the follow-up appointment.

## 2017-04-27 NOTE — Progress Notes (Addendum)
OFFICE CONSULT NOTE  Chief Complaint:  Follow-up  Primary Care Physician: Lucky Cowboy, MD  HPI:  Cory Carrillo is a 66 y.o. male who is being seen today for the evaluation of chest tightness at the request of Lucky Cowboy, MD. Cory Carrillo is currently referred for evaluation of exertional chest tightness. His family history significant for heart disease in his father who died at age 22, a brother who had a heart attack at age 33, a grandmother who had a stroke at age 79, and personal risk factors including hypertension, dyslipidemia and minimal physical activity. He is also former smoker and quit in 2000. He reports several episodes of chest tightness with exertion. This is associated with some substernal chest discomfort that radiates up to the neck and jaw. These episodes seem to improve fairly quickly with rest. He describes it as an achy feeling, but not really pain. He was advised to decrease his exercise and start on aspirin. EKG performed today demonstrates normal sinus rhythm at 61 without ischemic changes.  04/27/2017  Cory Carrillo returns today for follow-up. He underwent left heart catheterization by Dr. Katrinka Blazing for unstable angina. He was found to have coronary artery calcification involving left main, proximal circumflex and proximal LAD. The arteries however were widely patent with luminal irregularities up to 30%. LVEF was normal with normal LV filling pressure. No clear cause of his chest pain symptoms were noted. Aggressive risk factor modification was recommended. He reports she's had no further chest pain symptoms since a prior to presenting to my office. I reviewed lab work including recent lipid profile that he had on 03/13/2017 which showed total cholesterol 185, triglycerides 257, HDL 63 and LDL 71. Although this is good control, he is not a goal LDL less than 70 would benefit from further reduction in his triglycerides. Hemoglobin A1c was 4.9. He started exercising more and  feels well. He currently takes atorvastatin 20 mg every other day. He had previously taken it daily but then decreased it apparently at the instruction of his doctor who felt like he was at goal.  PMHx:  Past Medical History:  Diagnosis Date  . Anxiety   . Cervical stenosis of spine   . Depression   . GERD (gastroesophageal reflux disease)   . Glaucoma   . Gout   . Hyperlipidemia   . Hypertension   . Hypogonadism male   . Prediabetes   . Vitamin D deficiency     Past Surgical History:  Procedure Laterality Date  . LEFT HEART CATH AND CORONARY ANGIOGRAPHY N/A 04/11/2017   Procedure: LEFT HEART CATH AND CORONARY ANGIOGRAPHY;  Surgeon: Lyn Records, MD;  Location: [facility] INVASIVE CV LAB;  Service: Cardiovascular;  Laterality: N/A;    FAMHx:  Family History  Problem Relation Age of Onset  . Cancer Mother        colon  . Heart disease Father   . Heart disease Brother   . Hypertension Brother   . Heart disease Paternal Grandmother   . Stroke Paternal Grandmother   . Stroke Paternal Grandfather   . Healthy Son     SOCHx:   reports that he quit smoking about 10 years ago. He smoked 1.50 packs per day. He has never used smokeless tobacco. He reports that he drinks about 4.2 oz of alcohol per week . He reports that he does not use drugs.  ALLERGIES:  Allergies  Allergen Reactions  . Ace Inhibitors     cough  .  Other Itching    Synthetic opiods  Full Body itching    ROS: Pertinent items noted in HPI and remainder of comprehensive ROS otherwise negative.  HOME MEDS: Current Outpatient Prescriptions on File Prior to Visit  Medication Sig Dispense Refill  . aspirin 81 MG tablet Take 81 mg by mouth daily.    Marland Kitchen atenolol (TENORMIN) 25 MG tablet Take 25 mg by mouth daily.    . Cholecalciferol (VITAMIN D PO) Take 8,000 Units by mouth daily.     . citalopram (CELEXA) 20 MG tablet Take 20 mg by mouth daily.    . finasteride (PROSCAR) 5 MG tablet Take 1.25 mg by mouth daily.      Marland Kitchen latanoprost (XALATAN) 0.005 % ophthalmic solution Place 1 drop into both eyes at bedtime.    Marland Kitchen losartan (COZAAR) 100 MG tablet TAKE 1 TABLET (100 MG TOTAL) BY MOUTH DAILY. (Patient taking differently: Take 50 mg by mouth daily. ) 90 tablet 1  . Multiple Vitamins-Minerals (MULTIVITAMIN PO) Take 1 tablet by mouth daily.     . tadalafil (CIALIS) 20 MG tablet Take 1 tablet (20 mg total) by mouth daily as needed for erectile dysfunction. 30 tablet 8  . testosterone cypionate (DEPOTESTOSTERONE CYPIONATE) 200 MG/ML injection INJECT 1.5 ML TO 2 ML INTO MUSCLE EVERY 2 WEEKS (Patient taking differently: INJECT INTO MUSCLE EVERY 2 WEEKS) 10 mL 2   No current facility-administered medications on file prior to visit.     LABS/IMAGING: No results found for this or any previous visit (from the past 48 hour(s)). No results found.  LIPID PANEL:    Component Value Date/Time   CHOL 185 03/13/2017 1525   TRIG 257 (H) 03/13/2017 1525   HDL 63 03/13/2017 1525   CHOLHDL 2.9 03/13/2017 1525   VLDL 51 (H) 03/13/2017 1525   LDLCALC 71 03/13/2017 1525    WEIGHTS: Wt Readings from Last 3 Encounters:  04/27/17 209 lb 3.2 oz (94.9 kg)  04/11/17 205 lb (93 kg)  04/06/17 207 lb (93.9 kg)    VITALS: BP (!) 144/83   Pulse (!) 50   Ht  (1.88 m)   Wt 209 lb 3.2 oz (94.9 kg)   SpO2 97%   BMI 26.86 kg/m   EXAM: Deferred  EKG: Deferred  ASSESSMENT: 1. 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 Dyslipidemia 3. Hypertension 4. Family history of coronary artery disease  Carrillo: 1.   Cory Carrillo was having symptoms concerning for unstable angina however find out mild nonobstructive coronary disease. He does not have any recurrent symptoms at this time. I wouldn't advocate for aggressive risk factor modification and recommend him to take his atorvastatin 20 mg daily rather than every other day. This should allow his LDL to be lower than 70 and additionally  lower his triglycerides. We will repeat a lipid profile in 3 months and Carrillo to see him back annually or sooner as necessary.  Chrystie Nose, MD, [facility]  [place]  [facility]  Attending Cardiologist  Direct Dial: [phone]  Fax: [phone]  Website:  [email or web address] Nicely [email or web address] 04/27/2017, 12:10 PM

## 2017-05-25 ENCOUNTER — Other Ambulatory Visit: Payer: Self-pay | Admitting: Internal Medicine

## 2017-06-19 ENCOUNTER — Ambulatory Visit: Payer: Self-pay | Admitting: Internal Medicine

## 2017-06-22 ENCOUNTER — Other Ambulatory Visit: Payer: Self-pay | Admitting: Internal Medicine

## 2017-08-20 ENCOUNTER — Encounter: Payer: Self-pay | Admitting: Physician Assistant

## 2017-08-21 ENCOUNTER — Other Ambulatory Visit: Payer: Self-pay | Admitting: Internal Medicine

## 2017-09-26 ENCOUNTER — Ambulatory Visit: Payer: Self-pay | Admitting: Internal Medicine

## 2017-11-08 ENCOUNTER — Ambulatory Visit: Payer: Self-pay | Admitting: Internal Medicine

## 2017-11-08 VITALS — BP 114/66 | HR 64 | Temp 97.0°F | Resp 18 | Ht 73.5 in | Wt 214.6 lb

## 2017-11-08 DIAGNOSIS — R7303 Prediabetes: Secondary | ICD-10-CM

## 2017-11-08 DIAGNOSIS — I10 Essential (primary) hypertension: Secondary | ICD-10-CM

## 2017-11-08 DIAGNOSIS — E559 Vitamin D deficiency, unspecified: Secondary | ICD-10-CM

## 2017-11-08 DIAGNOSIS — E782 Mixed hyperlipidemia: Secondary | ICD-10-CM

## 2017-11-08 NOTE — Patient Instructions (Signed)

## 2017-11-08 NOTE — Progress Notes (Signed)
This very nice 67 y.o. MWM  presents for 3 month follow up with HTN, HLD, Pre-Diabetes and Vitamin D Deficiency.      Patient is treated for HTN (1994)  & BP has been controlled at home. Today's BP is at goal - 114/66.  Patient had a heart cath in Sept 2018 by Dr Rennis GoldenHilty finding minimal CAD recommending medical treatment. Patient has had no complaints of any cardiac type chest pain, palpitations, dyspnea / orthopnea / PND, dizziness, claudication, or dependent edema.     Hyperlipidemia is controlled with diet & meds. Patient denies myalgias or other med SE's. Last Lipids were at goal albeit elevated Trig's: Lab Results  Component Value Date   CHOL 185 03/13/2017   HDL 63 03/13/2017   LDLCALC 71 03/13/2017   TRIG 257 (H) 03/13/2017   CHOLHDL 2.9 03/13/2017      Also, the patient has history of PreDiabetes (A1c 5.7%/2012)  and has had no symptoms of reactive hypoglycemia, diabetic polys, paresthesias or visual blurring.  Last A1c was Normal & at goal: Lab Results  Component Value Date   HGBA1C 4.9 03/13/2017      Further, the patient also has history of Vitamin D Deficiency ("14"/2008)  and supplements vitamin D without any suspected side-effects. Last vitamin D was at goal:  Lab Results  Component Value Date   VD25OH 76 03/13/2017   Current Outpatient Medications on File Prior to Visit  Medication Sig  . aspirin 81 MG tablet Take 81 mg by mouth daily.  Marland Kitchen. atenolol (TENORMIN) 50 MG tablet TAKE 1 TABLET BY MOUTH EVERY DAY FOR BLOOD PRESSURE  . atorvastatin (LIPITOR) 20 MG tablet Take 1 tablet (20 mg total) by mouth daily.  . Cholecalciferol (VITAMIN D PO) Take 8,000 Units by mouth daily.   . citalopram (CELEXA) 40 MG tablet TAKE 1 TABLET BY MOUTH EVERY DAY FOR MOOD  . finasteride (PROSCAR) 5 MG tablet TAKE 1 TABLET BY MOUTH EVERY DAY  . latanoprost (XALATAN) 0.005 % ophthalmic solution Place 1 drop into both eyes at bedtime.  Marland Kitchen. losartan (COZAAR) 100 MG tablet TAKE 1 TABLET (100 MG  TOTAL) BY MOUTH DAILY. (Patient taking differently: Take 50 mg by mouth daily. )  . Multiple Vitamins-Minerals (MULTIVITAMIN PO) Take 1 tablet by mouth daily.   . tadalafil (CIALIS) 20 MG tablet Take 1 tablet (20 mg total) by mouth daily as needed for erectile dysfunction.  Marland Kitchen. testosterone cypionate (DEPOTESTOSTERONE CYPIONATE) 200 MG/ML injection INJECT 1.5 ML TO 2 ML INTO MUSCLE EVERY 2 WEEKS (Patient taking differently: INJECT 1ML INTO MUSCLE EVERY 2 WEEKS)   No current facility-administered medications on file prior to visit.    Allergies  Allergen Reactions  . Ace Inhibitors     cough  . Other Itching    Synthetic opiods  Full Body itching   PMHx:   Past Medical History:  Diagnosis Date  . Anxiety   . Cervical stenosis of spine   . Depression   . GERD (gastroesophageal reflux disease)   . Glaucoma   . Gout   . Hyperlipidemia   . Hypertension   . Hypogonadism male   . Prediabetes   . Vitamin D deficiency    Immunization History  Administered Date(s) Administered  . PPD Test 01/02/2014, 01/21/2015  . Pneumococcal-Unspecified 08/23/1992  . Tdap 08/19/2012   Past Surgical History:  Procedure Laterality Date  . LEFT HEART CATH AND CORONARY ANGIOGRAPHY N/A 04/11/2017   Procedure: LEFT HEART CATH AND  CORONARY ANGIOGRAPHY;  Surgeon: Lyn Records, MD;  Location: [facility] INVASIVE CV LAB;  Service: Cardiovascular;  Laterality: N/A;   FHx:    Reviewed / unchanged  SHx:    Reviewed / unchanged   Systems Review:  Constitutional: Denies fever, chills, wt changes, headaches, insomnia, fatigue, night sweats, change in appetite. Eyes: Denies redness, blurred vision, diplopia, discharge, itchy, watery eyes.  ENT: Denies discharge, congestion, post nasal drip, epistaxis, sore throat, earache, hearing loss, dental pain, tinnitus, vertigo, sinus pain, snoring.  CV: Denies chest pain, palpitations, irregular heartbeat, syncope, dyspnea, diaphoresis, orthopnea, PND, claudication or  edema. Respiratory: denies cough, dyspnea, DOE, pleurisy, hoarseness, laryngitis, wheezing.  Gastrointestinal: Denies dysphagia, odynophagia, heartburn, reflux, water brash, abdominal pain or cramps, nausea, vomiting, bloating, diarrhea, constipation, hematemesis, melena, hematochezia  or hemorrhoids. Genitourinary: Denies dysuria, frequency, urgency, nocturia, hesitancy, discharge, hematuria or flank pain. Musculoskeletal: Denies arthralgias, myalgias, stiffness, jt. swelling, pain, limping or strain/sprain.  Skin: Denies pruritus, rash, hives, warts, acne, eczema or change in skin lesion(s). Neuro: No weakness, tremor, incoordination, spasms, paresthesia or pain. Psychiatric: Denies confusion, memory loss or sensory loss. Endo: Denies change in weight, skin or hair change.  Heme/Lymph: No excessive bleeding, bruising or enlarged lymph nodes.  Physical Exam  BP 114/66   Pulse 64   Temp (!) 97 F (36.1 C)   Resp 18   Ht 6' 1.5" (1.867 m)   Wt 214 lb 9.6 oz (97.3 kg)   BMI 27.93 kg/m   Appears  well nourished, well groomed  and in no distress.  Eyes: PERRLA, EOMs, conjunctiva no swelling or erythema. Sinuses: No frontal/maxillary tenderness ENT/Mouth: EAC's clear, TM's nl w/o erythema, bulging. Nares clear w/o erythema, swelling, exudates. Oropharynx clear without erythema or exudates. Oral hygiene is good. Tongue normal, non obstructing. Hearing intact.  Neck: Supple. Thyroid not palpable. Car 2+/2+ without bruits, nodes or JVD. Chest: Respirations nl with BS clear & equal w/o rales, rhonchi, wheezing or stridor.  Cor: Heart sounds normal w/ regular rate and rhythm without sig. murmurs, gallops, clicks or rubs. Peripheral pulses normal and equal  without edema.  Abdomen: Soft & bowel sounds normal. Non-tender w/o guarding, rebound, hernias, masses or organomegaly.  Lymphatics: Unremarkable.  Musculoskeletal: Full ROM all peripheral extremities, joint stability, 5/5 strength and  normal gait.  Skin: Warm, dry without exposed rashes, lesions or ecchymosis apparent.  Neuro: Cranial nerves intact, reflexes equal bilaterally. Sensory-motor testing grossly intact. Tendon reflexes grossly intact.  Pysch: Alert & oriented x 3.  Insight and judgement nl & appropriate. No ideations.  Assessment and Plan:  1. Essential hypertension  - Continue medication, monitor blood pressure at home.  - Continue DASH diet.  Reminder to go to the ER if any CP,  SOB, nausea, dizziness, severe HA, changes vision/speech.   2. Hyperlipidemia, mixed  - Continue diet/meds, exercise,& lifestyle modifications.  - Continue monitor periodic cholesterol/liver & renal functions    3. Prediabetes  - Continue diet, exercise, lifestyle modifications.  - Monitor appropriate labs.  4. Vitamin D deficiency  - Continue supplementation.           Discussed  regular exercise, BP monitoring, weight control to achieve/maintain BMI less than 25 and discussed med and SE's. Deferred labs today at patient request as patient currently has no insurance, but will return in several months for a Welcome to Medicare visit.  Over 30 minutes of exam, counseling, chart review was performed.

## 2017-11-10 ENCOUNTER — Encounter: Payer: Self-pay | Admitting: Internal Medicine

## 2017-11-16 ENCOUNTER — Other Ambulatory Visit: Payer: Self-pay | Admitting: Internal Medicine

## 2017-11-16 DIAGNOSIS — I10 Essential (primary) hypertension: Secondary | ICD-10-CM

## 2018-02-10 DIAGNOSIS — E663 Overweight: Secondary | ICD-10-CM | POA: Insufficient documentation

## 2018-02-10 NOTE — Progress Notes (Signed)
MEDICARE ANNUAL WELLNESS VISIT AND FOLLOW UP Assessment:   Diagnoses and all orders for this visit:  Welcome to Medicare preventive visit  Unstable angina (HCC) Hasn't had since cath/workup by Dr. Rennis Golden Control blood pressure, cholesterol, glucose, increase exercise.  Continue ASA  Essential hypertension Continue medication Monitor blood pressure at home; call if consistently over 130/80 Continue DASH diet.   Reminder to go to the ER if any CP, SOB, nausea, dizziness, severe HA, changes vision/speech, left arm numbness and tingling and jaw pain.  Gastroesophageal reflux disease, esophagitis presence not specified Well managed on current medications Discussed diet, avoiding triggers and other lifestyle changes  Testosterone Deficiency - continue to monitor, states medication is helping with symptoms of low T.   Vitamin D deficiency At goal at recent check; continue to recommend supplementation for goal of 70-100 Defer vitamin D level  Other abnormal glucose Recent A1Cs at goal Discussed diet/exercise, weight management  Defer A1C; check CMP  Open-angle glaucoma of right eye, unspecified glaucoma stage, unspecified open-angle glaucoma type Continue drops, followed by ophthalmology  Mixed hyperlipidemia Continue medications, LDL goal <70 Continue low cholesterol diet and exercise.  Check lipid panel.   Medication management CBC, CMP/GFR  Idiopathic gout, unspecified chronicity, unspecified site Not on allopurinol, no recent flares   Overweight (BMI 25.0-29.9) Long discussion about weight loss, diet, and exercise Recommended diet heavy in fruits and veggies and low in animal meats, cheeses, and dairy products, appropriate calorie intake Discussed appropriate weight for height  Follow up at next visit  Former smoker EKG, AAA [place]  Need for pneumonia vaccination Prevnar 13 administered  Colon cancer screening Colonoscopy- patient declines a colonoscopy even though  the risks and benefits were discussed at length. Colon cancer is 3rd most diagnosed cancer and 2nd leading cause of death in both men and women 44 years of age and older. Patient understands the risk of cancer and death with declining the test however they are willing to do cologuard screening instead. They understand that this is not as sensitive or specific as a colonoscopy and they are still recommended to get a colonoscopy. The cologuard will be sent out to their house.    Over 30 minutes of exam, counseling, chart review, and critical decision making was performed  Future Appointments  Date Time Provider Department Center  04/10/2018  2:00 PM Lucky Cowboy, MD [facility] None     Plan:   During the course of the visit the patient was educated and counseled about appropriate screening and preventive services including:    Pneumococcal vaccine   Influenza vaccine  Prevnar 13  Td vaccine  Screening electrocardiogram  Colorectal cancer screening  Diabetes screening  Glaucoma screening  Nutrition counseling    Subjective:  Cory Carrillo is a 67 y.o. male who presents for Medicare Annual Wellness Visit and 3 month follow up for HTN, hyperlipidemia, glucose management, and vitamin D Def. Patient had a heart cath in Sept 2018 by Dr Rennis Golden after c/o angia which demonstrated findings of minimal CAD, and was recommended medical treatment. He has some chronic left hip/knee pain intermittently thought to be r/t lower back, has had injection in the past that worked well. He declines interventions at this time.   BMI is Body mass index is 28.11 kg/m., he has not been working on diet and exercise.  Wt Readings from Last 3 Encounters:  02/11/18 216 lb (98 kg)  11/08/17 214 lb 9.6 oz (97.3 kg)  04/27/17 209 lb 3.2 oz (94.9  kg)   His blood pressure has been controlled at home, today their BP is BP: 112/70 He does not workout. He denies chest pain, shortness of breath, dizziness.    He is on cholesterol medication and denies myalgias. His cholesterol is not at goal. The cholesterol last visit was:   Lab Results  Component Value Date   CHOL 185 03/13/2017   HDL 63 03/13/2017   LDLCALC 71 03/13/2017   TRIG 257 (H) 03/13/2017   CHOLHDL 2.9 03/13/2017   He has not been working on diet and exercise for glucose management, and denies foot ulcerations, increased appetite, nausea, paresthesia of the feet, polydipsia, polyuria, visual disturbances, vomiting and weight loss. Last A1C in the office was:  Lab Results  Component Value Date   HGBA1C 4.9 03/13/2017   Last GFR Lab Results  Component Value Date   GFRNONAA 86 04/06/2017   Patient is on Vitamin D supplement.   Lab Results  Component Value Date   VD25OH 6476 03/13/2017     He has a history of testosterone deficiency and is on testosterone replacement. He states that the testosterone helps with his energy, libido, muscle mass. Lab Results  Component Value Date   TESTOSTERONE 217 (L) 03/13/2017     Medication Review:  Current Outpatient Medications (Endocrine & Metabolic):  .  testosterone cypionate (DEPOTESTOSTERONE CYPIONATE) 200 MG/ML injection, INJECT 1.5 ML TO 2 ML INTO MUSCLE EVERY 2 WEEKS (Patient taking differently: INJECT 1ML INTO MUSCLE EVERY 2 WEEKS)  Current Outpatient Medications (Cardiovascular):  .  atenolol (TENORMIN) 50 MG tablet, TAKE 1 TABLET BY MOUTH EVERY DAY FOR BLOOD PRESSURE .  atorvastatin (LIPITOR) 20 MG tablet, Take 1 tablet (20 mg total) by mouth daily. Marland Kitchen.  losartan (COZAAR) 100 MG tablet, TAKE 1 TABLET BY MOUTH EVERY DAY (Patient taking differently: TAKE 1/2 TABLET BY MOUTH EVERY DAY) .  tadalafil (CIALIS) 20 MG tablet, Take 1 tablet (20 mg total) by mouth daily as needed for erectile dysfunction.   Current Outpatient Medications (Analgesics):  .  aspirin 81 MG tablet, Take 81 mg by mouth daily.   Current Outpatient Medications (Other):  Marland Kitchen.  Cholecalciferol (VITAMIN D PO),  Take 8,000 Units by mouth daily.  .  citalopram (CELEXA) 40 MG tablet, TAKE 1 TABLET BY MOUTH EVERY DAY FOR MOOD .  finasteride (PROSCAR) 5 MG tablet, TAKE 1 TABLET BY MOUTH EVERY DAY .  latanoprost (XALATAN) 0.005 % ophthalmic solution, Place 1 drop into both eyes at bedtime. .  Multiple Vitamins-Minerals (MULTIVITAMIN PO), Take 1 tablet by mouth daily.   Allergies: Allergies  Allergen Reactions  . Ace Inhibitors     cough  . Other Itching    Synthetic opiods  Full Body itching    Current Problems (verified) has Essential hypertension; Mixed hyperlipidemia; GERD ; Other abnormal glucose; Gout; Testosterone Deficiency; Vitamin D deficiency; Medication management; Open-angle glaucoma; Unstable angina (HCC); and Overweight (BMI 25.0-29.9) on their problem list.  Screening Tests Immunization History  Administered Date(s) Administered  . PPD Test 01/02/2014, 01/21/2015  . Pneumococcal-Unspecified 08/23/1992  . Tdap 08/19/2012   Preventative care: Last colonoscopy: had in 2004, declines further, willing to do cologuard  Prior vaccinations: TD or Tdap: 2014  Influenza: Declines  Pneumococcal: 1994 Prevnar13: DUE  Shingles/Zostavax: declines   Names of Other Physician/Practitioners you currently use: 1. [facility] here for primary care 2. [facility], eye doctor, last visit 2019 3. , dentist, last visit 2018  Patient Care Team: Lucky CowboyMcKeown, William,  MD as PCP - General (Internal Medicine) Sharrell Ku, MD as Consulting Physician (Gastroenterology)  Surgical: He  has a past surgical history that includes LEFT HEART CATH AND CORONARY ANGIOGRAPHY (N/A, 04/11/2017). Family His family history includes Cancer in his mother; Healthy in his son; Heart disease in his brother, father, and paternal grandmother; Hypertension in his brother; Stroke in his paternal grandfather and paternal grandmother. Social history  He reports that he quit  smoking about 11 years ago. He smoked 1.50 packs per day. He has never used smokeless tobacco. He reports that he drinks about 4.2 oz of alcohol per week. He reports that he does not use drugs.  MEDICARE WELLNESS OBJECTIVES: Physical activity: Current Exercise Habits: The patient does not participate in regular exercise at present, Exercise limited by: orthopedic condition(s);Other - see comments Cardiac risk factors: Cardiac Risk Factors include: dyslipidemia;male gender;hypertension;advanced age (>81men, >5 women);sedentary lifestyle;smoking/ tobacco exposure Depression/mood screen:   Depression screen [facility] 2/9 02/11/2018  Decreased Interest 0  Down, Depressed, Hopeless 0  PHQ - 2 Score 0    ADLs:  In your present state of health, do you have any difficulty performing the following activities: 02/11/2018 11/10/2017  Hearing? N N  Vision? N N  Difficulty concentrating or making decisions? N -  Walking or climbing stairs? N N  Dressing or bathing? N N  Doing errands, shopping? N N  Some recent data might be hidden     Cognitive Testing  Alert? Yes  Normal Appearance?Yes  Oriented to person? Yes  Place? Yes   Time? Yes  Recall of three objects?  Yes  Can perform simple calculations? Yes  Displays appropriate judgment?Yes  Can read the correct time from a watch face?Yes  EOL planning: Does Patient Have a Medical Advance Directive?: No Would patient like information on creating a medical advance directive?: No - Patient declined   Objective:   Today's Vitals   02/11/18 1553  BP: 112/70  Pulse: (!) 53  Temp: (!) 97.3 F (36.3 C)  SpO2: 97%  Weight: 216 lb (98 kg)  Height: 6' 1.5" (1.867 m)  PainSc: 5   PainLoc: Hip   Body mass index is 28.11 kg/m.  General appearance: alert, no distress, WD/WN, male HEENT: normocephalic, sclerae anicteric, TMs pearly, nares patent, no discharge or erythema, pharynx normal Oral cavity: MMM, no lesions Neck: supple, no lymphadenopathy,  no thyromegaly, no masses Heart: RRR, normal S1, S2, no murmurs Lungs: CTA bilaterally, no wheezes, rhonchi, or rales Abdomen: +bs, soft, non tender, non distended, no masses, no hepatomegaly, no splenomegaly Musculoskeletal: nontender, no swelling, no obvious deformity Extremities: no edema, no cyanosis, no clubbing Pulses: 2+ symmetric, upper and lower extremities, normal cap refill Neurological: alert, oriented x 3, CN2-12 intact, strength normal upper extremities and lower extremities, sensation normal throughout, DTRs 2+ throughout, no cerebellar signs, gait normal Psychiatric: normal affect, behavior normal, pleasant   Medicare Attestation I have personally reviewed: The patient's medical and social history Their use of alcohol, tobacco or illicit drugs Their current medications and supplements The patient's functional ability including ADLs,fall risks, home safety risks, cognitive, and hearing and visual impairment Diet and physical activities Evidence for depression or mood disorders  The patient's weight, height, BMI, and visual acuity have been recorded in the chart.  I have made referrals, counseling, and provided education to the patient based on review of the above and I have provided the patient with a written personalized care plan for preventive services.     Morrie Sheldon  Osker Mason, NP   02/11/2018

## 2018-02-11 ENCOUNTER — Encounter: Payer: Self-pay | Admitting: Adult Health

## 2018-02-11 ENCOUNTER — Ambulatory Visit (INDEPENDENT_AMBULATORY_CARE_PROVIDER_SITE_OTHER): Payer: Medicare Other | Admitting: Adult Health

## 2018-02-11 VITALS — BP 112/70 | HR 53 | Temp 97.3°F | Ht 73.5 in | Wt 216.0 lb

## 2018-02-11 DIAGNOSIS — I200 Unstable angina: Secondary | ICD-10-CM

## 2018-02-11 DIAGNOSIS — R7309 Other abnormal glucose: Secondary | ICD-10-CM

## 2018-02-11 DIAGNOSIS — R6889 Other general symptoms and signs: Secondary | ICD-10-CM

## 2018-02-11 DIAGNOSIS — E291 Testicular hypofunction: Secondary | ICD-10-CM

## 2018-02-11 DIAGNOSIS — Z0001 Encounter for general adult medical examination with abnormal findings: Secondary | ICD-10-CM

## 2018-02-11 DIAGNOSIS — E782 Mixed hyperlipidemia: Secondary | ICD-10-CM

## 2018-02-11 DIAGNOSIS — Z23 Encounter for immunization: Secondary | ICD-10-CM

## 2018-02-11 DIAGNOSIS — H4010X0 Unspecified open-angle glaucoma, stage unspecified: Secondary | ICD-10-CM

## 2018-02-11 DIAGNOSIS — E663 Overweight: Secondary | ICD-10-CM

## 2018-02-11 DIAGNOSIS — M1000 Idiopathic gout, unspecified site: Secondary | ICD-10-CM

## 2018-02-11 DIAGNOSIS — Z79899 Other long term (current) drug therapy: Secondary | ICD-10-CM | POA: Diagnosis not present

## 2018-02-11 DIAGNOSIS — Z136 Encounter for screening for cardiovascular disorders: Secondary | ICD-10-CM | POA: Diagnosis not present

## 2018-02-11 DIAGNOSIS — K219 Gastro-esophageal reflux disease without esophagitis: Secondary | ICD-10-CM | POA: Diagnosis not present

## 2018-02-11 DIAGNOSIS — Z0000 Encounter for general adult medical examination without abnormal findings: Secondary | ICD-10-CM

## 2018-02-11 DIAGNOSIS — I10 Essential (primary) hypertension: Secondary | ICD-10-CM

## 2018-02-11 DIAGNOSIS — E559 Vitamin D deficiency, unspecified: Secondary | ICD-10-CM

## 2018-02-11 DIAGNOSIS — Z1211 Encounter for screening for malignant neoplasm of colon: Secondary | ICD-10-CM | POA: Diagnosis not present

## 2018-02-11 DIAGNOSIS — Z87891 Personal history of nicotine dependence: Secondary | ICD-10-CM

## 2018-02-11 NOTE — Patient Instructions (Signed)
Aim for 7+ servings of fruits and vegetables daily  80+ fluid ounces of water or unsweet tea for healthy kidneys  Limit alcohol intake, avoid smoking  Limit animal fats in diet for cholesterol and heart health - choose grass fed whenever available  Aim for low stress - take time to unwind and care for your mental health  Aim for 150 min of moderate intensity exercise weekly for heart health, and weights twice weekly for bone health  Aim for 7-9 hours of sleep daily      When it comes to diets, agreement about the perfect plan isn't easy to find, even among the experts. Experts at the [employer] developed an idea known as the Healthy Eating Plate. Just imagine a plate divided into logical, healthy portions.  The emphasis is on diet quality:  Load up on vegetables and fruits - one-half of your plate: Aim for color and variety, and remember that potatoes don't count.  Go for whole grains - one-quarter of your plate: Whole wheat, barley, wheat berries, quinoa, oats, brown rice, and foods made with them. If you want pasta, go with whole wheat pasta.  Protein power - one-quarter of your plate: Fish, chicken, beans, and nuts are all healthy, versatile protein sources. Limit red meat.  The diet, however, does go beyond the plate, offering a few other suggestions.  Use healthy plant oils, such as olive, canola, soy, corn, sunflower and peanut. Check the labels, and avoid partially hydrogenated oil, which have unhealthy trans fats.  If you're thirsty, drink water. Coffee and tea are good in moderation, but skip sugary drinks and limit milk and dairy products to one or two daily servings.  The type of carbohydrate in the diet is more important than the amount. Some sources of carbohydrates, such as vegetables, fruits, whole grains, and beans-are healthier than others.  Finally, stay active.   

## 2018-02-12 LAB — CBC WITH DIFFERENTIAL/PLATELET
BASOS PCT: 0.7 %
Basophils Absolute: 30 cells/uL (ref 0–200)
Eosinophils Absolute: 60 cells/uL (ref 15–500)
Eosinophils Relative: 1.4 %
HCT: 45.4 % (ref 38.5–50.0)
Hemoglobin: 15.2 g/dL (ref 13.2–17.1)
Lymphs Abs: 955 cells/uL (ref 850–3900)
MCH: 29.9 pg (ref 27.0–33.0)
MCHC: 33.5 g/dL (ref 32.0–36.0)
MCV: 89.4 fL (ref 80.0–100.0)
MONOS PCT: 11.5 %
MPV: 13 fL — AB (ref 7.5–12.5)
Neutro Abs: 2761 cells/uL (ref 1500–7800)
Neutrophils Relative %: 64.2 %
PLATELETS: 143 10*3/uL (ref 140–400)
RBC: 5.08 10*6/uL (ref 4.20–5.80)
RDW: 13.3 % (ref 11.0–15.0)
TOTAL LYMPHOCYTE: 22.2 %
WBC mixed population: 495 cells/uL (ref 200–950)
WBC: 4.3 10*3/uL (ref 3.8–10.8)

## 2018-02-12 LAB — COMPLETE METABOLIC PANEL WITH GFR
AG Ratio: 2.4 (calc) (ref 1.0–2.5)
ALT: 30 U/L (ref 9–46)
AST: 30 U/L (ref 10–35)
Albumin: 4.5 g/dL (ref 3.6–5.1)
Alkaline phosphatase (APISO): 46 U/L (ref 40–115)
BUN: 12 mg/dL (ref 7–25)
CALCIUM: 9.4 mg/dL (ref 8.6–10.3)
CHLORIDE: 105 mmol/L (ref 98–110)
CO2: 29 mmol/L (ref 20–32)
Creat: 0.9 mg/dL (ref 0.70–1.25)
GFR, EST NON AFRICAN AMERICAN: 89 mL/min/{1.73_m2} (ref >=60)
GFR, Est African American: 104 mL/min/{1.73_m2} (ref >=60)
GLUCOSE: 101 mg/dL — AB (ref 65–99)
Globulin: 1.9 g/dL (calc) (ref 1.9–3.7)
Potassium: 4.5 mmol/L (ref 3.5–5.3)
Sodium: 142 mmol/L (ref 135–146)
Total Bilirubin: 0.4 mg/dL (ref 0.2–1.2)
Total Protein: 6.4 g/dL (ref 6.1–8.1)

## 2018-02-12 LAB — LIPID PANEL
Cholesterol: 146 mg/dL (ref <200)
HDL: 64 mg/dL (ref >40)
LDL CHOLESTEROL (CALC): 64 mg/dL
NON-HDL CHOLESTEROL (CALC): 82 mg/dL (ref <130)
TRIGLYCERIDES: 95 mg/dL (ref <150)
Total CHOL/HDL Ratio: 2.3 (calc) (ref <5.0)

## 2018-02-12 LAB — TSH: TSH: 0.95 mIU/L (ref 0.40–4.50)

## 2018-03-21 DIAGNOSIS — Z1211 Encounter for screening for malignant neoplasm of colon: Secondary | ICD-10-CM | POA: Diagnosis not present

## 2018-03-22 LAB — COLOGUARD: Cologuard: NEGATIVE

## 2018-03-28 ENCOUNTER — Encounter: Payer: Self-pay | Admitting: Adult Health

## 2018-04-02 ENCOUNTER — Encounter: Payer: Self-pay | Admitting: Adult Health

## 2018-04-10 ENCOUNTER — Encounter: Payer: Self-pay | Admitting: Internal Medicine

## 2018-04-29 ENCOUNTER — Other Ambulatory Visit: Payer: Self-pay | Admitting: *Deleted

## 2018-04-29 MED ORDER — TADALAFIL 20 MG PO TABS: 20.0000 mg | ORAL_TABLET | Freq: Every day | ORAL | 8 refills | Status: DC | PRN

## 2018-05-14 DIAGNOSIS — H401131 Primary open-angle glaucoma, bilateral, mild stage: Secondary | ICD-10-CM | POA: Diagnosis not present

## 2018-05-23 ENCOUNTER — Other Ambulatory Visit: Payer: Self-pay | Admitting: Internal Medicine

## 2018-05-23 DIAGNOSIS — E782 Mixed hyperlipidemia: Secondary | ICD-10-CM

## 2018-05-28 ENCOUNTER — Ambulatory Visit (INDEPENDENT_AMBULATORY_CARE_PROVIDER_SITE_OTHER): Payer: Medicare Other | Admitting: Internal Medicine

## 2018-05-28 ENCOUNTER — Encounter: Payer: Self-pay | Admitting: Internal Medicine

## 2018-05-28 VITALS — BP 114/70 | HR 60 | Temp 97.5°F | Resp 16 | Ht 73.5 in | Wt 213.2 lb

## 2018-05-28 DIAGNOSIS — Z79899 Other long term (current) drug therapy: Secondary | ICD-10-CM | POA: Diagnosis not present

## 2018-05-28 DIAGNOSIS — E782 Mixed hyperlipidemia: Secondary | ICD-10-CM

## 2018-05-28 DIAGNOSIS — E291 Testicular hypofunction: Secondary | ICD-10-CM

## 2018-05-28 DIAGNOSIS — R7309 Other abnormal glucose: Secondary | ICD-10-CM | POA: Diagnosis not present

## 2018-05-28 DIAGNOSIS — R7303 Prediabetes: Secondary | ICD-10-CM

## 2018-05-28 DIAGNOSIS — I10 Essential (primary) hypertension: Secondary | ICD-10-CM

## 2018-05-28 DIAGNOSIS — E559 Vitamin D deficiency, unspecified: Secondary | ICD-10-CM | POA: Diagnosis not present

## 2018-05-28 DIAGNOSIS — I200 Unstable angina: Secondary | ICD-10-CM

## 2018-05-28 NOTE — Progress Notes (Signed)
[facility]   Cory Carrillo, M.D.     Cory Carrillo, P.A.-C Cory Gaudier, DNP [name]                [address]                [place], [place]. [address] Telephone [phone] Telefax [phone]     This very nice 67 y.o. MWM  has been followed for HTN, HLD, Prediabetes and Vitamin D Deficiency.     HTN predates since 89. Patient's BP has been controlled at home.  Today's BP is at goal -  114/70.  In Sept 2018, Patient had a Heart Cath by Dr Rennis Golden finding minimal CAD. Patient denies any cardiac symptoms as chest pain, palpitations, shortness of breath, dizziness or ankle swelling.     Patient's hyperlipidemia is controlled with diet and medications. Patient denies myalgias or other medication SE's. Last lipids were at goal:  Lab Results  Component Value Date   CHOL 146 02/11/2018   HDL 64 02/11/2018   LDLCALC 64 02/11/2018   TRIG 95 02/11/2018   CHOLHDL 2.3 02/11/2018      Patient has hx/o prediabetes  (A1c 5.7% / 2012)  and patient denies reactive hypoglycemic symptoms, visual blurring, diabetic polys or paresthesias. Last A1c was Normal & at goal: Lab Results  Component Value Date   HGBA1C 4.9 03/13/2017       Patient is on parenteral Testosterone Replacement with improved sense of well-being.     Finally, patient has history of Vitamin D Deficiency  ("14" / 2008)  and last vitamin D was at goal: Lab Results  Component Value Date   VD25OH 76 03/13/2017   Current Outpatient Medications on File Prior to Visit  Medication Sig  . aspirin 81 MG tablet Take 81 mg by mouth daily.  Marland Kitchen atenolol (TENORMIN) 50 MG tablet TAKE 1 TABLET BY MOUTH EVERY DAY FOR BLOOD PRESSURE  . atorvastatin (LIPITOR) 20 MG tablet TAKE 1 TABLET BY MOUTH EVERY DAY  . Cholecalciferol (VITAMIN D PO) Take 8,000 Units by mouth daily.   . citalopram (CELEXA) 40 MG tablet TAKE 1 TABLET BY MOUTH EVERY DAY FOR MOOD  . finasteride  (PROSCAR) 5 MG tablet TAKE 1 TABLET BY MOUTH EVERY DAY  . latanoprost (XALATAN) 0.005 % ophthalmic solution Place 1 drop into both eyes at bedtime.  Marland Kitchen losartan (COZAAR) 100 MG tablet TAKE 1 TABLET BY MOUTH EVERY DAY (Patient taking differently: TAKE 1/2 TABLET BY MOUTH EVERY DAY)  . Multiple Vitamins-Minerals (MULTIVITAMIN PO) Take 1 tablet by mouth daily.   . tadalafil (CIALIS) 20 MG tablet Take 1 tablet (20 mg total) by mouth daily as needed for erectile dysfunction.  Marland Kitchen testosterone cypionate (DEPOTESTOSTERONE CYPIONATE) 200 MG/ML injection INJECT 1.5 ML TO 2 ML INTO MUSCLE EVERY 2 WEEKS (Patient taking differently: INJECT INTO MUSCLE EVERY 2 WEEKS)  . timolol (BETIMOL) 0.25 % ophthalmic solution Place 1 drop into both eyes 2 (two) times daily.   No current facility-administered medications on file prior to visit.    Allergies  Allergen Reactions  . Ace Inhibitors     cough  . Other Itching    Synthetic opiods  Full Body itching   Past Medical History:  Diagnosis Date  . Anxiety   . Cervical stenosis of spine   . Depression   . GERD (gastroesophageal reflux disease)   . Glaucoma   . Gout   .  Hyperlipidemia   . Hypertension   . Hypogonadism male   . Prediabetes   . Vitamin D deficiency    Health Maintenance  Topic Date Due  . PNA vac Low Risk Adult (1 of 2 - PCV13) 02/10/2018  . INFLUENZA VACCINE  02/28/2018  . COLONOSCOPY  02/12/2019 (Originally 02/11/2003)  . TETANUS/TDAP  08/19/2022  . Hepatitis C Screening  Completed  . HIV Screening  Completed   Immunization History  Administered Date(s) Administered  . PPD Test 01/02/2014, 01/21/2015  . Pneumococcal-Unspecified 08/23/1992  . Tdap 08/19/2012   Past Surgical History:  Procedure Laterality Date  . LEFT HEART CATH AND CORONARY ANGIOGRAPHY N/A 04/11/2017   Procedure: LEFT HEART CATH AND CORONARY ANGIOGRAPHY;  Surgeon: Lyn Records, MD;  Location: [facility];  Service: Cardiovascular;  Laterality: N/A;    Family History  Problem Relation Age of Onset  . Liver cancer Mother   . Heart disease Father   . Heart disease Brother   . Hypertension Brother   . Heart disease Paternal Grandmother   . Stroke Paternal Grandmother   . Stroke Paternal Grandfather   . Healthy Son    Social History   Socioeconomic History  . Marital status: Married    Spouse name: Chip Boer  . Number of children: 1 son & 1 daughter  Occupational History  . retired  Tobacco Use  . Smoking status: Former Smoker    Packs/day: 1.50    Last attempt to quit: 08/23/2006    Years since quitting: 11.7  . Smokeless tobacco: Never Used  Substance and Sexual Activity  . Alcohol use: Yes    Alcohol/week: 7.0 standard drinks    Types: 7 Standard drinks or equivalent per week  . Drug use: No  . Sexual activity: Not on file    ROS Constitutional: Denies fever, chills, weight loss/gain, headaches, insomnia,  night sweats or change in appetite. Does c/o fatigue. Eyes: Denies redness, blurred vision, diplopia, discharge, itchy or watery eyes.  ENT: Denies discharge, congestion, post nasal drip, epistaxis, sore throat, earache, hearing loss, dental pain, Tinnitus, Vertigo, Sinus pain or snoring.  Cardio: Denies chest pain, palpitations, irregular heartbeat, syncope, dyspnea, diaphoresis, orthopnea, PND, claudication or edema Respiratory: denies cough, dyspnea, DOE, pleurisy, hoarseness, laryngitis or wheezing.  Gastrointestinal: Denies dysphagia, heartburn, reflux, water brash, pain, cramps, nausea, vomiting, bloating, diarrhea, constipation, hematemesis, melena, hematochezia, jaundice or hemorrhoids Genitourinary: Denies dysuria, frequency, urgency, nocturia, hesitancy, discharge, hematuria or flank pain Musculoskeletal: Denies arthralgia, myalgia, stiffness, Jt. Swelling, pain, limp or strain/sprain. Denies Falls. Skin: Denies puritis, rash, hives, warts, acne, eczema or change in skin lesion Neuro: No weakness, tremor,  incoordination, spasms, paresthesia or pain Psychiatric: Denies confusion, memory loss or sensory loss. Denies Depression. Endocrine: Denies change in weight, skin, hair change, nocturia, and paresthesia, diabetic polys, visual blurring or hyper / hypo glycemic episodes.  Heme/Lymph: No excessive bleeding, bruising or enlarged lymph nodes.  Physical Exam  BP 114/70   Pulse 60   Temp (!) 97.5 F (36.4 C)   Resp 16   Ht 6' 1.5" (1.867 m)   Wt 213 lb 3.2 oz (96.7 kg)   BMI 27.75 kg/m   General Appearance: Well nourished and well groomed and in no apparent distress.  Eyes: PERRLA, EOMs, conjunctiva no swelling or erythema, normal fundi and vessels. Sinuses: No frontal/maxillary tenderness ENT/Mouth: EACs patent / TMs  nl. Nares clear without erythema, swelling, mucoid exudates. Oral hygiene is good. No erythema, swelling, or exudate. Tongue normal, non-obstructing.  Tonsils not swollen or erythematous. Hearing normal.  Neck: Supple, thyroid not palpable. No bruits, nodes or JVD. Respiratory: Respiratory effort normal.  BS equal and clear bilateral without rales, rhonci, wheezing or stridor. Cardio: Heart sounds are normal with regular rate and rhythm and no murmurs, rubs or gallops. Peripheral pulses are normal and equal bilaterally without edema. No aortic or femoral bruits. Chest: symmetric with normal excursions and percussion.  Abdomen: Soft, with Nl bowel sounds. Nontender, no guarding, rebound, hernias, masses, or organomegaly.  Musculoskeletal: Full ROM all peripheral extremities, joint stability, 5/5 strength, and normal gait. Skin: Warm and dry without rashes, lesions, cyanosis, clubbing or  ecchymosis.  Neuro: Cranial nerves intact, reflexes equal bilaterally. Normal muscle tone, no cerebellar symptoms. Sensation intact.  Pysch: Alert and oriented X 3 with normal affect, insight and judgment appropriate.   Assessment and Plan  1. Essential hypertension  - CBC with  Differential/Platelet - COMPLETE METABOLIC PANEL WITH GFR - Magnesium - TSH  2. Hyperlipidemia, mixed  - Lipid panel - TSH  3. Abnormal glucose  - Hemoglobin A1c - Insulin, random  4. Vitamin D deficiency  - VITAMIN D 25 Hydroxyl  5. Prediabetes  - Hemoglobin A1c - Insulin, random  6. Testosterone Deficiency  - Testosterone  7. Medication management  - CBC with Differential/Platelet - COMPLETE METABOLIC PANEL WITH GFR - Magnesium - Lipid panel - TSH - Hemoglobin A1c - Insulin, random - VITAMIN D 25 Hydroxyl            Patient was counseled in prudent diet, weight control to achieve/maintain BMI less than 25, BP monitoring, regular exercise and medications as discussed.  Discussed med effects and SE's. Routine screening labs and tests as requested with regular follow-up as recommended. Over 40 minutes of exam, counseling, chart review and high complex critical decision making was performed

## 2018-05-28 NOTE — Patient Instructions (Addendum)

## 2018-05-29 LAB — CBC WITH DIFFERENTIAL/PLATELET
BASOS PCT: 1.1 %
Basophils Absolute: 69 cells/uL (ref 0–200)
EOS PCT: 1.6 %
Eosinophils Absolute: 101 cells/uL (ref 15–500)
HEMATOCRIT: 48.2 % (ref 38.5–50.0)
Hemoglobin: 16.6 g/dL (ref 13.2–17.1)
LYMPHS ABS: 1380 {cells}/uL (ref 850–3900)
MCH: 30.9 pg (ref 27.0–33.0)
MCHC: 34.4 g/dL (ref 32.0–36.0)
MCV: 89.8 fL (ref 80.0–100.0)
MPV: 13.3 fL — AB (ref 7.5–12.5)
Monocytes Relative: 9.6 %
NEUTROS PCT: 65.8 %
Neutro Abs: 4145 cells/uL (ref 1500–7800)
PLATELETS: 165 10*3/uL (ref 140–400)
RBC: 5.37 10*6/uL (ref 4.20–5.80)
RDW: 13.3 % (ref 11.0–15.0)
Total Lymphocyte: 21.9 %
WBC mixed population: 605 cells/uL (ref 200–950)
WBC: 6.3 10*3/uL (ref 3.8–10.8)

## 2018-05-29 LAB — COMPLETE METABOLIC PANEL WITH GFR
AG Ratio: 2.6 (calc) — ABNORMAL HIGH (ref 1.0–2.5)
ALBUMIN MSPROF: 4.6 g/dL (ref 3.6–5.1)
ALT: 20 U/L (ref 9–46)
AST: 21 U/L (ref 10–35)
Alkaline phosphatase (APISO): 43 U/L (ref 40–115)
BILIRUBIN TOTAL: 0.5 mg/dL (ref 0.2–1.2)
BUN: 12 mg/dL (ref 7–25)
CHLORIDE: 102 mmol/L (ref 98–110)
CO2: 30 mmol/L (ref 20–32)
Calcium: 9.8 mg/dL (ref 8.6–10.3)
Creat: 1.02 mg/dL (ref 0.70–1.25)
GFR, Est African American: 89 mL/min/{1.73_m2} (ref >=60)
GFR, Est Non African American: 77 mL/min/{1.73_m2} (ref >=60)
GLOBULIN: 1.8 g/dL — AB (ref 1.9–3.7)
Glucose, Bld: 81 mg/dL (ref 65–99)
POTASSIUM: 5 mmol/L (ref 3.5–5.3)
SODIUM: 138 mmol/L (ref 135–146)
Total Protein: 6.4 g/dL (ref 6.1–8.1)

## 2018-05-29 LAB — LIPID PANEL
Cholesterol: 149 mg/dL (ref <200)
HDL: 60 mg/dL (ref >40)
LDL CHOLESTEROL (CALC): 68 mg/dL
Non-HDL Cholesterol (Calc): 89 mg/dL (calc) (ref <130)
Total CHOL/HDL Ratio: 2.5 (calc) (ref <5.0)
Triglycerides: 125 mg/dL (ref <150)

## 2018-05-29 LAB — VITAMIN D 25 HYDROXY (VIT D DEFICIENCY, FRACTURES): VIT D 25 HYDROXY: 91 ng/mL (ref 30–100)

## 2018-05-29 LAB — HEMOGLOBIN A1C
EAG (MMOL/L): 5.2 (calc)
HEMOGLOBIN A1C: 4.9 %{Hb} (ref <5.7)
MEAN PLASMA GLUCOSE: 94 (calc)

## 2018-05-29 LAB — TSH: TSH: 1 mIU/L (ref 0.40–4.50)

## 2018-05-29 LAB — MAGNESIUM: MAGNESIUM: 2 mg/dL (ref 1.5–2.5)

## 2018-05-29 LAB — TESTOSTERONE: Testosterone: 699 ng/dL (ref 250–827)

## 2018-05-29 LAB — INSULIN, RANDOM: INSULIN: 5.9 u[IU]/mL (ref 2.0–19.6)

## 2018-06-02 ENCOUNTER — Encounter: Payer: Self-pay | Admitting: Internal Medicine

## 2018-06-13 DIAGNOSIS — H401131 Primary open-angle glaucoma, bilateral, mild stage: Secondary | ICD-10-CM | POA: Diagnosis not present

## 2018-08-12 ENCOUNTER — Other Ambulatory Visit: Payer: Self-pay | Admitting: Internal Medicine

## 2018-08-14 ENCOUNTER — Other Ambulatory Visit: Payer: Self-pay | Admitting: Internal Medicine

## 2018-08-29 NOTE — Progress Notes (Signed)
FOLLOW UP  Assessment and Plan:   Hypertension Well controlled with current medications  Monitor blood pressure at home; patient to call if consistently greater than 130/80 Continue DASH diet.   Reminder to go to the ER if any CP, SOB, nausea, dizziness, severe HA, changes vision/speech, left arm numbness and tingling and jaw pain.  Cholesterol Currently at goal; continue statin Continue low cholesterol diet and exercise.  Check lipid panel.   Abnormal glucose Recent A1Cs at goal Discussed diet/exercise, weight management  Defer A1C; check CMP  Overweight Long discussion about weight loss, diet, and exercise Recommended diet heavy in fruits and veggies and low in animal meats, cheeses, and dairy products, appropriate calorie intake Discussed ideal weight for height  Patient will continue to work on portions, vegetable intake, exercise Will follow up in 3 months  Hypogonadism - continue replacement therapy, patient still perceives benefit, check testosterone levels as needed.   Vitamin D Def At goal at last visit; continue supplementation to maintain goal of 70-100 Defer Vit D level  Gout Not on allopurinol, no flares in 5 years Diet discussed Check uric acid as needed  Continue diet and meds as discussed. Further disposition pending results of labs. Discussed med's effects and SE's.   Over 30 minutes of exam, counseling, chart review, and critical decision making was performed.   Future Appointments  Date Time Provider Department Center  12/18/2018 11:00 AM Lucky CowboyMcKeown, William, MD [facility] None    ----------------------------------------------------------------------------------------------------------------------  HPI 68 y.o. male  presents for 3 month follow up on hypertension, cholesterol, glucose management, low T, gout, weight and vitamin D deficiency. In Sept 2018, Patient had a Heart Cath by Dr Rennis GoldenHilty finding minimal CAD.   BMI is Body mass index is 27.72  kg/m., he has been working on diet and exercise, he reports he is down to 202lb at home naked, he is watching portions and increasing vegetable intake.  Wt Readings from Last 3 Encounters:  09/02/18 213 lb (96.6 kg)  05/28/18 213 lb 3.2 oz (96.7 kg)  02/11/18 216 lb (98 kg)   His blood pressure has been controlled at home (typically 1102-120s/60-70s), taking atenolol 25 mg, losartan 50 mg daily, today their BP is BP: 110/62  He does workout, walks in his neighborhood, depends on work schedule. He denies chest pain, shortness of breath, dizziness.   He is on cholesterol medication Atorvastatin 20 mg daily and denies myalgias. His cholesterol is at goal. The cholesterol last visit was:   Lab Results  Component Value Date   CHOL 149 05/28/2018   HDL 60 05/28/2018   LDLCALC 68 05/28/2018   TRIG 125 05/28/2018   CHOLHDL 2.5 05/28/2018    He has been working on diet and exercise for glucose management, and denies increased appetite, nausea, paresthesia of the feet, polydipsia, polyuria and visual disturbances. Last A1C in the office was:  Lab Results  Component Value Date   HGBA1C 4.9 05/28/2018   Patient is on Vitamin D supplement and at goal at recheck check:    Lab Results  Component Value Date   VD25OH 91 05/28/2018     Patient has hx of gout but is NOT on allopurinol for gout and does not report a recent flare.  Lab Results  Component Value Date   LABURIC 7.1 03/13/2017   He has a history of testosterone deficiency and is on testosterone replacement, taking 300-400 mg every 2 weeks. He states that the testosterone helps with his energy, libido, muscle mass. Lab  Results  Component Value Date   TESTOSTERONE 699 05/28/2018     Current Medications:  Current Outpatient Medications on File Prior to Visit  Medication Sig  . aspirin 81 MG tablet Take 81 mg by mouth daily.  Marland Kitchen atenolol (TENORMIN) 50 MG tablet TAKE 1 TABLET BY MOUTH EVERY DAY FOR BLOOD PRESSURE  . atorvastatin  (LIPITOR) 20 MG tablet TAKE 1 TABLET BY MOUTH EVERY DAY  . Cholecalciferol (VITAMIN D PO) Take 8,000 Units by mouth daily.   . citalopram (CELEXA) 40 MG tablet TAKE 1 TABLET BY MOUTH EVERY DAY FOR MOOD  . finasteride (PROSCAR) 5 MG tablet TAKE 1 TABLET BY MOUTH EVERY DAY (Patient taking differently: Takes 1/4 tablet everyday)  . latanoprost (XALATAN) 0.005 % ophthalmic solution Place 1 drop into both eyes at bedtime.  Marland Kitchen losartan (COZAAR) 100 MG tablet TAKE 1 TABLET BY MOUTH EVERY DAY (Patient taking differently: TAKE 1/2 TABLET BY MOUTH EVERY DAY)  . Multiple Vitamins-Minerals (MULTIVITAMIN PO) Take 1 tablet by mouth daily.   . tadalafil (CIALIS) 20 MG tablet Take 1 tablet (20 mg total) by mouth daily as needed for erectile dysfunction.  Marland Kitchen testosterone cypionate (DEPOTESTOSTERONE CYPIONATE) 200 MG/ML injection INJECT 1.5- INTO THE MUSCLE EVERY 2 WEEKS  . timolol (BETIMOL) 0.5 % ophthalmic solution Place 1 drop into both eyes 2 (two) times daily.    No current facility-administered medications on file prior to visit.      Allergies:  Allergies  Allergen Reactions  . Ace Inhibitors     cough  . Other Itching    Synthetic opiods  Full Body itching     Medical History:  Past Medical History:  Diagnosis Date  . Anxiety   . Cervical stenosis of spine   . Depression   . GERD (gastroesophageal reflux disease)   . Glaucoma   . Gout   . Hyperlipidemia   . Hypertension   . Hypogonadism male   . Prediabetes   . Vitamin D deficiency    Family history- Reviewed and unchanged Social history- Reviewed and unchanged   Review of Systems:  Review of Systems  Constitutional: Negative for malaise/fatigue and weight loss.  HENT: Positive for tinnitus (chronic, bilateral, stable). Negative for hearing loss.   Eyes: Negative for blurred vision and double vision.  Respiratory: Negative for cough, shortness of breath and wheezing.   Cardiovascular: Negative for chest pain, palpitations,  orthopnea, claudication and leg swelling.  Gastrointestinal: Negative for abdominal pain, blood in stool, constipation, diarrhea, heartburn, melena, nausea and vomiting.  Genitourinary: Negative.   Musculoskeletal: Negative for joint pain and myalgias.  Skin: Negative for rash.  Neurological: Negative for dizziness, tingling, sensory change, weakness and headaches.  Endo/Heme/Allergies: Negative for polydipsia.  Psychiatric/Behavioral: Negative.   All other systems reviewed and are negative.   Physical Exam: BP 110/62   Pulse (!) 59   Temp (!) 97.2 F (36.2 C)   Ht 6' 1.5" (1.867 m)   Wt 213 lb (96.6 kg)   SpO2 98%   BMI 27.72 kg/m  Wt Readings from Last 3 Encounters:  09/02/18 213 lb (96.6 kg)  05/28/18 213 lb 3.2 oz (96.7 kg)  02/11/18 216 lb (98 kg)   General Appearance: Well nourished, in no apparent distress. Eyes: PERRLA, EOMs, conjunctiva no swelling or erythema Sinuses: No Frontal/maxillary tenderness ENT/Mouth: Ext aud canals clear, TMs without erythema, bulging. No erythema, swelling, or exudate on post pharynx.  Tonsils not swollen or erythematous. Hearing normal.  Neck: Supple, thyroid normal.  Respiratory: Respiratory effort normal, BS equal bilaterally without rales, rhonchi, wheezing or stridor.  Cardio: RRR with no MRGs. Brisk peripheral pulses without edema.  Abdomen: Soft, + BS.  Non tender, no guarding, rebound, hernias, masses. Lymphatics: Non tender without lymphadenopathy.  Musculoskeletal: Full ROM, 5/5 strength, Normal gait Skin: Warm, dry without rashes, lesions, ecchymosis.  Neuro: Cranial nerves intact. No cerebellar symptoms.  Psych: Awake and oriented X 3, normal affect, Insight and Judgment appropriate.    Dan Maker, NP 2:45 PM [facility]

## 2018-09-02 ENCOUNTER — Encounter: Payer: Self-pay | Admitting: Adult Health

## 2018-09-02 ENCOUNTER — Ambulatory Visit (INDEPENDENT_AMBULATORY_CARE_PROVIDER_SITE_OTHER): Payer: Medicare Other | Admitting: Adult Health

## 2018-09-02 VITALS — BP 110/62 | HR 59 | Temp 97.2°F | Ht 73.5 in | Wt 213.0 lb

## 2018-09-02 DIAGNOSIS — M1000 Idiopathic gout, unspecified site: Secondary | ICD-10-CM

## 2018-09-02 DIAGNOSIS — E663 Overweight: Secondary | ICD-10-CM | POA: Diagnosis not present

## 2018-09-02 DIAGNOSIS — E559 Vitamin D deficiency, unspecified: Secondary | ICD-10-CM | POA: Diagnosis not present

## 2018-09-02 DIAGNOSIS — Z79899 Other long term (current) drug therapy: Secondary | ICD-10-CM

## 2018-09-02 DIAGNOSIS — Z23 Encounter for immunization: Secondary | ICD-10-CM | POA: Diagnosis not present

## 2018-09-02 DIAGNOSIS — E782 Mixed hyperlipidemia: Secondary | ICD-10-CM | POA: Diagnosis not present

## 2018-09-02 DIAGNOSIS — R7309 Other abnormal glucose: Secondary | ICD-10-CM

## 2018-09-02 DIAGNOSIS — I10 Essential (primary) hypertension: Secondary | ICD-10-CM | POA: Diagnosis not present

## 2018-09-02 DIAGNOSIS — E291 Testicular hypofunction: Secondary | ICD-10-CM | POA: Diagnosis not present

## 2018-09-02 MED ORDER — LOSARTAN POTASSIUM 50 MG PO TABS: 50.0000 mg | ORAL_TABLET | Freq: Every day | ORAL | 1 refills | Status: DC

## 2018-09-02 MED ORDER — ATENOLOL 25 MG PO TABS: 25.0000 mg | ORAL_TABLET | Freq: Every day | ORAL | 1 refills | Status: DC

## 2018-09-02 NOTE — Addendum Note (Signed)
Addended by: Dionicio Stall on: 09/02/2018 03:34 PM   Modules accepted: Orders

## 2018-09-02 NOTE — Patient Instructions (Addendum)
Goals    . Blood Pressure < 130/80    . LDL CALC < 100      Know what a healthy weight is for you (roughly BMI <25) and aim to maintain this  Aim for 7+ servings of fruits and vegetables daily  65-80+ fluid ounces of water or unsweet tea for healthy kidneys  Limit to max 1 drink of alcohol per day; avoid smoking/tobacco  Limit animal fats in diet for cholesterol and heart health - choose grass fed whenever available  Avoid highly processed foods, and foods high in saturated/trans fats  Aim for low stress - take time to unwind and care for your mental health  Aim for 150 min of moderate intensity exercise weekly for heart health, and weights twice weekly for bone health  Aim for 7-9 hours of sleep daily     Tinnitus Tinnitus refers to hearing a sound when there is no actual source for that sound. This is often described as ringing in the ears. However, people with this condition may hear a variety of noises, in one ear or in both ears. The sounds of tinnitus can be soft, loud, or somewhere in between. Tinnitus can last for a few seconds or can be constant for days. It may go away without treatment and come back at various times. When tinnitus is constant or happens often, it can lead to other problems, such as trouble sleeping and trouble concentrating. Almost everyone experiences tinnitus at some point. Tinnitus that is long-lasting (chronic) or comes back often (recurs) may require medical attention. What are the causes? The cause of tinnitus is often not known. In some cases, it can result from other problems or conditions, including:  Exposure to loud noises from machinery, music, or other sources.  Hearing loss.  Ear or sinus infections.  Earwax buildup.  An object (foreign body) stuck in the ear.  Taking certain medicines.  Drinking alcohol or caffeine.  High blood pressure.  Heart diseases.  Anemia.  Allergies.  Meniere's disease.  Thyroid  problems.  Tumors.  A weak, bulging blood vessel (aneurysm) near the ear.  Depression or other mood disorders. What are the signs or symptoms? The main symptom of tinnitus is hearing a sound when there is no source for that sound. It may sound like:  Buzzing.  Roaring.  Ringing.  Blowing air, like the sound heard when you listen to a seashell.  Hissing.  Whistling.  Sizzling.  Humming.  Running water.  A musical note.  Tapping. Symptoms may affect only one ear (unilateral) or both ears (bilateral). How is this diagnosed? Tinnitus is diagnosed based on your symptoms, your medical history, and a physical exam. Your health care provider may do a thorough hearing test (audiologic exam) if your tinnitus:  Is unilateral.  Causes hearing difficulties.  Lasts 6 months or longer. You may work with a health care provider who specializes in hearing disorders (audiologist). You may be asked questions about your symptoms and how they affect your daily life. You may have other tests done, such as:  CT scan.  MRI.  An imaging test of how blood flows through your blood vessels (angiogram). How is this treated? Treating an underlying medical condition can sometimes make tinnitus go away. If your tinnitus continues, other treatments may include:  Medicines, such as antidepressants or sleeping aids.  Sound generators to mask the tinnitus. These include: ? Tabletop sound machines that play relaxing sounds to help you fall asleep. ? Wearable devices  that fit in your ear and play sounds or music. ? Acoustic neural stimulation. This involves using headphones to listen to music that contains an auditory signal. Over time, listening to this signal may change some pathways in your brain and make you less sensitive to tinnitus. This treatment is used for very severe cases when no other treatment is working.  Therapy and counseling to help you manage the stress of living with  tinnitus.  Using hearing aids or cochlear implants if your tinnitus is related to hearing loss. Hearing aids are worn in the outer ear. Cochlear implants are surgically placed in the inner ear. Follow these instructions at home: Managing symptoms      When possible, avoid being in loud places and being exposed to loud sounds.  Wear hearing protection, such as earplugs, when you are exposed to loud noises.  Use a white noise machine, a humidifier, or other devices to mask the sound of tinnitus.  Practice techniques for reducing stress, such as meditation, yoga, or deep breathing. Work with your health care provider if you need help with managing stress.  Sleep with your head slightly raised. This may reduce the impact of tinnitus. General instructions  Do not use stimulants, such as nicotine, alcohol, or caffeine. Talk with your health care provider about other stimulants to avoid. Stimulants are substances that can make you feel alert and attentive by increasing certain activities in the body (such as heart rate and blood pressure). These substances may make tinnitus worse.  Take over-the-counter and prescription medicines only as told by your health care provider.  Try to get plenty of sleep each night.  Keep all follow-up visits as told by your health care provider. This is important. Contact a health care provider if:  Your tinnitus continues for 3 weeks or longer without stopping.  Your symptoms get worse or do not get better with home care.  You develop tinnitus after a head injury.  You have tinnitus along with any of the following: ? Dizziness. ? Loss of balance. ? Nausea and vomiting. Summary  Tinnitus refers to hearing a sound when there is no actual source for that sound. This is often described as ringing in the ears.  Symptoms may affect only one ear (unilateral) or both ears (bilateral).  Use a white noise machine, a humidifier, or other devices to mask the  sound of tinnitus.  Do not use stimulants, such as nicotine, alcohol, or caffeine. Talk with your health care provider about other stimulants to avoid. These substances may make tinnitus worse. This information is not intended to replace advice given to you by your health care provider. Make sure you discuss any questions you have with your health care provider. Document Released: 07/17/2005 Document Revised: 04/26/2017 Document Reviewed: 04/26/2017 [employer]vier Interactive Patient Education  2019 [employer]

## 2018-09-03 LAB — CBC WITH DIFFERENTIAL/PLATELET
Absolute Monocytes: 575 cells/uL (ref 200–950)
Basophils Absolute: 41 cells/uL (ref 0–200)
Basophils Relative: 0.9 %
EOS PCT: 1.3 %
Eosinophils Absolute: 60 cells/uL (ref 15–500)
HEMATOCRIT: 48.7 % (ref 38.5–50.0)
Hemoglobin: 16.6 g/dL (ref 13.2–17.1)
LYMPHS ABS: 1302 {cells}/uL (ref 850–3900)
MCH: 30.8 pg (ref 27.0–33.0)
MCHC: 34.1 g/dL (ref 32.0–36.0)
MCV: 90.4 fL (ref 80.0–100.0)
MPV: 12.6 fL — ABNORMAL HIGH (ref 7.5–12.5)
Monocytes Relative: 12.5 %
NEUTROS PCT: 57 %
Neutro Abs: 2622 cells/uL (ref 1500–7800)
PLATELETS: 142 10*3/uL (ref 140–400)
RBC: 5.39 10*6/uL (ref 4.20–5.80)
RDW: 14.8 % (ref 11.0–15.0)
TOTAL LYMPHOCYTE: 28.3 %
WBC: 4.6 10*3/uL (ref 3.8–10.8)

## 2018-09-03 LAB — COMPLETE METABOLIC PANEL WITH GFR
AG Ratio: 2.4 (calc) (ref 1.0–2.5)
ALKALINE PHOSPHATASE (APISO): 38 U/L (ref 35–144)
ALT: 22 U/L (ref 9–46)
AST: 24 U/L (ref 10–35)
Albumin: 4.6 g/dL (ref 3.6–5.1)
BILIRUBIN TOTAL: 0.6 mg/dL (ref 0.2–1.2)
BUN: 12 mg/dL (ref 7–25)
CALCIUM: 9.6 mg/dL (ref 8.6–10.3)
CO2: 29 mmol/L (ref 20–32)
Chloride: 104 mmol/L (ref 98–110)
Creat: 0.86 mg/dL (ref 0.70–1.25)
GFR, Est African American: 105 mL/min/{1.73_m2} (ref >=60)
GFR, Est Non African American: 91 mL/min/{1.73_m2} (ref >=60)
GLOBULIN: 1.9 g/dL (ref 1.9–3.7)
GLUCOSE: 83 mg/dL (ref 65–99)
POTASSIUM: 4.6 mmol/L (ref 3.5–5.3)
SODIUM: 140 mmol/L (ref 135–146)
Total Protein: 6.5 g/dL (ref 6.1–8.1)

## 2018-09-03 LAB — LIPID PANEL
Cholesterol: 149 mg/dL (ref <200)
HDL: 60 mg/dL (ref >=40)
LDL Cholesterol (Calc): 63 mg/dL (calc)
Non-HDL Cholesterol (Calc): 89 mg/dL (calc) (ref <130)
Total CHOL/HDL Ratio: 2.5 (calc) (ref <5.0)
Triglycerides: 180 mg/dL — ABNORMAL HIGH (ref <150)

## 2018-09-03 LAB — TSH: TSH: 0.88 mIU/L (ref 0.40–4.50)

## 2018-09-12 DIAGNOSIS — H401131 Primary open-angle glaucoma, bilateral, mild stage: Secondary | ICD-10-CM | POA: Diagnosis not present

## 2018-09-23 IMAGING — CR DG CHEST 2V
2 series · 2 of 2 positions shown · non-contrast
Comparison: 09/19/2005.

CLINICAL DATA: Episodes of angina. Pre cardiac catheterization
evaluation.

EXAM:
CHEST  2 VIEW

[w chest pa]
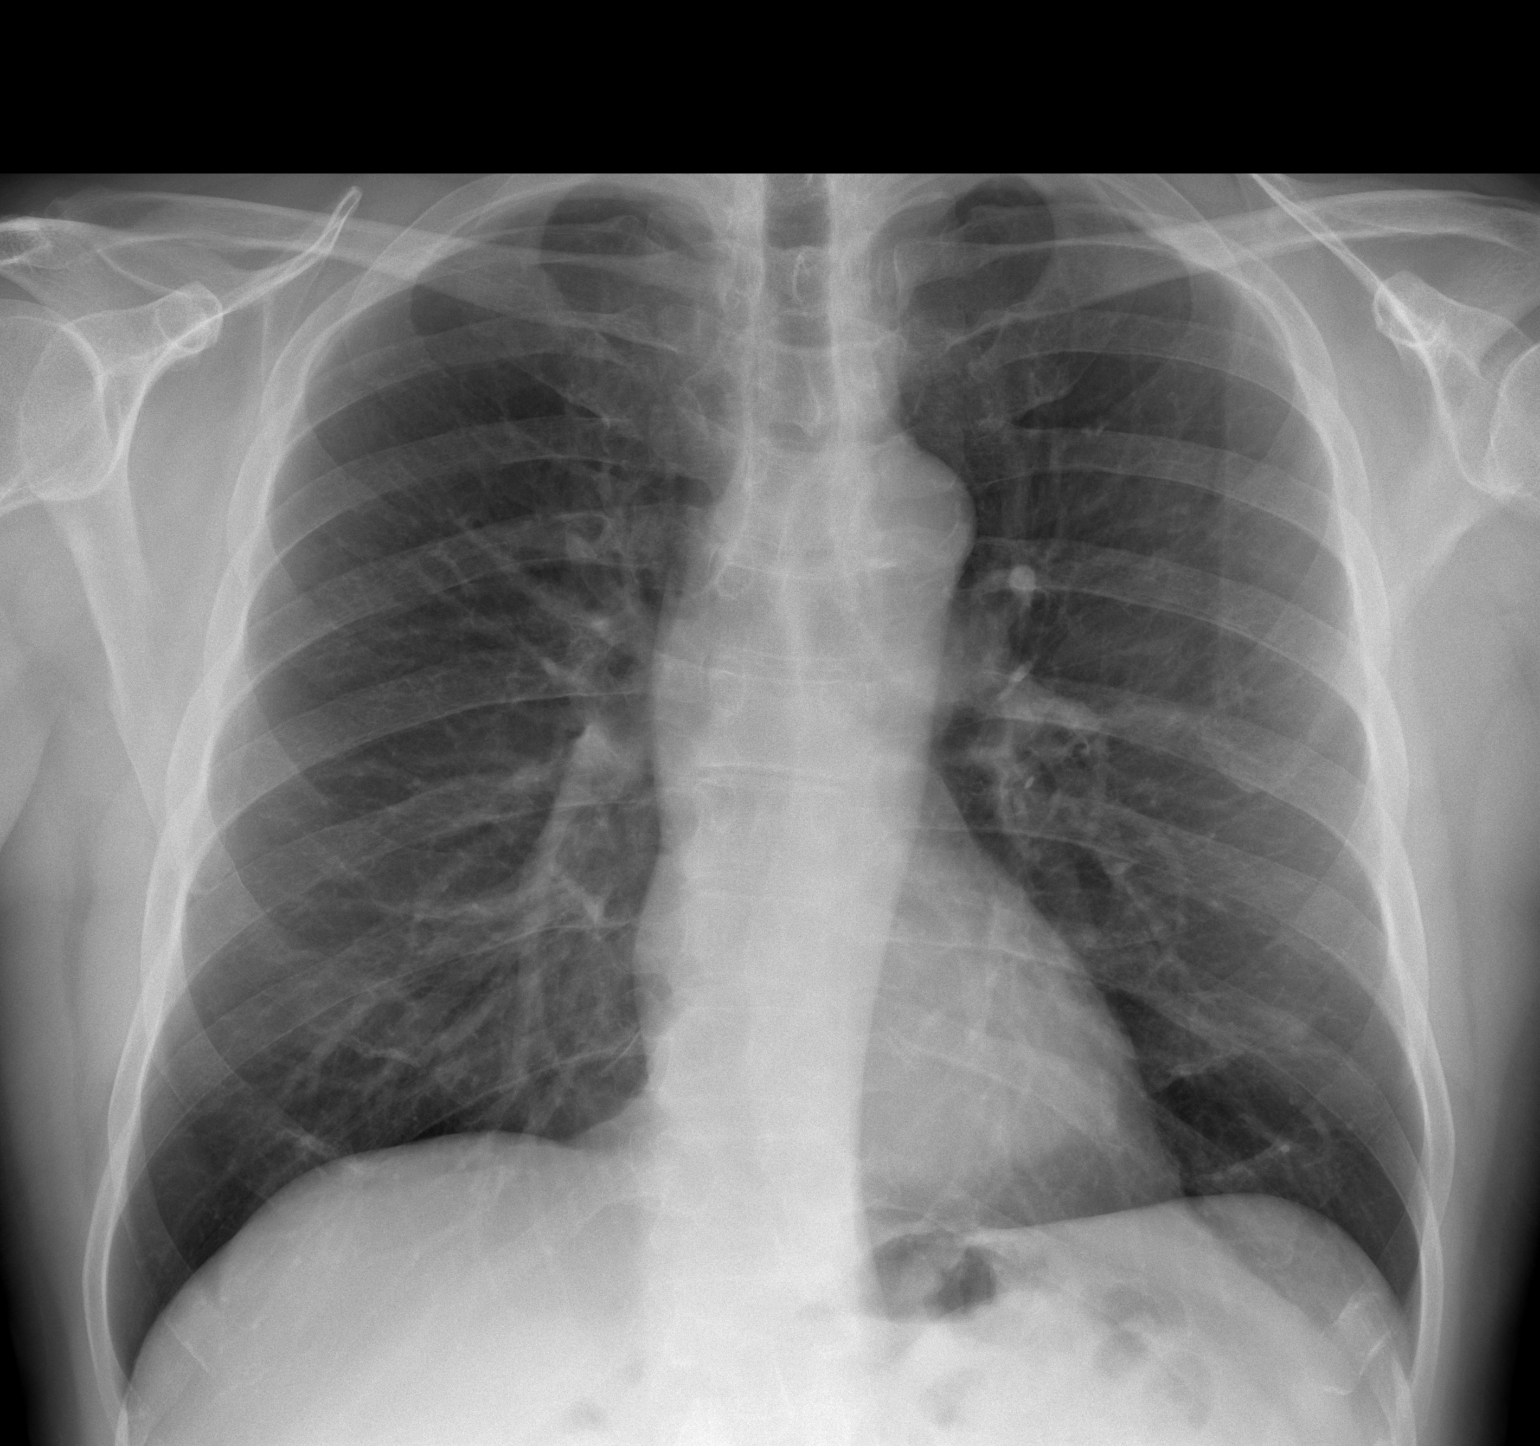

[w chest lat]
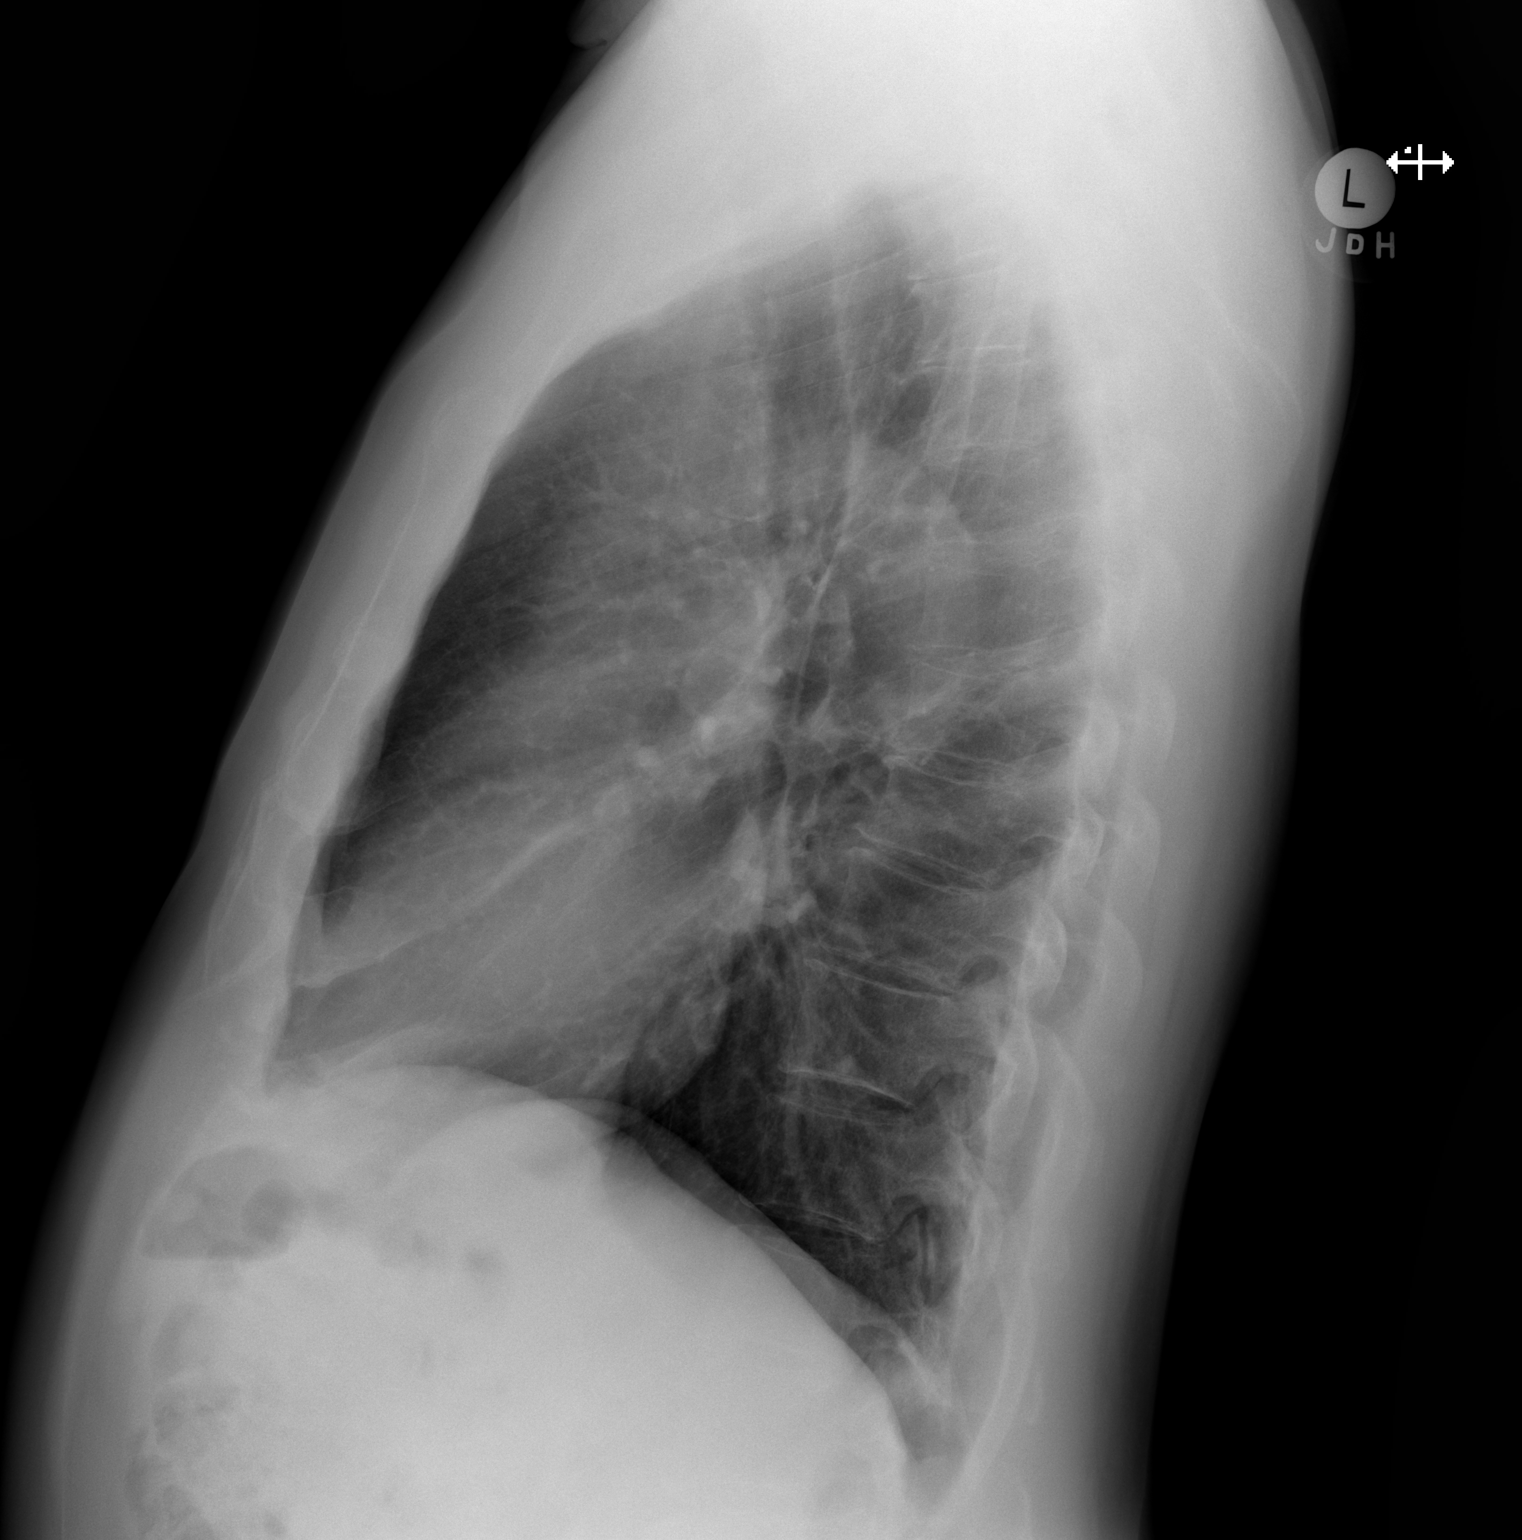

[2 of 2 positions shown; findings below may reference images not displayed]

FINDINGS: Normal sized heart. Clear lungs with normal vascularity. Mildly
tortuous and partially calcified thoracic aorta. Minimal scoliosis
and mild thoracic spine degenerative changes.
IMPRESSION: No acute abnormality.

## 2018-10-14 ENCOUNTER — Other Ambulatory Visit: Payer: Self-pay | Admitting: Internal Medicine

## 2018-12-04 ENCOUNTER — Ambulatory Visit: Payer: Self-pay | Admitting: Internal Medicine

## 2018-12-17 ENCOUNTER — Encounter: Payer: Self-pay | Admitting: Internal Medicine

## 2018-12-17 NOTE — Progress Notes (Signed)
[facility]   Lucky Cowboy, M.D.     [name]. [name], P.A.-C Judd Gaudier, DNP [facility]                [address]                [place], [place]. [address] Telephone [phone] Telefax [phone] Comprehensive Evaluation & Examination  History of Present Illness:     This very nice 68 y.o. MWM presents for a  comprehensive evaluation and management of multiple medical co-morbidities.  Patient has been followed for HTN, HLD, T2_NIDDM  Prediabetes and Vitamin D Deficiency.     HTN predates circa 1994. Patient's BP has been controlled at home.  Today's BP is at goal - 138/76. Patient had an essentially Negative Heart Cath in 9/20189 with minimal CAD.  Patient denies any cardiac symptoms as chest pain, palpitations, shortness of breath, dizziness or ankle swelling.     Patient's hyperlipidemia is controlled with diet and Atorvastatin. Patient denies myalgias or other medication SE's. Last lipids were at goal albeit elevated Trig's: Lab Results  Component Value Date   CHOL 149 09/02/2018   HDL 60 09/02/2018   LDLCALC 63 09/02/2018   TRIG 180 (H) 09/02/2018   CHOLHDL 2.5 09/02/2018      Patient has hx/o prediabetes (A1c 5.7% / 2012) and patient denies reactive hypoglycemic symptoms, visual blurring, diabetic polys or paresthesias. Last A1c was Normal & at goal: Lab Results  Component Value Date   HGBA1C 4.9 05/28/2018       Patient is Testosterone Deficient and has been on parenteral Replacement since May 2015  with improved stamina & sense of well being.          Finally, patient has history of Vitamin D Deficiency ("14" / 2008) and last vitamin D was at goal: Lab Results  Component Value Date   VD25OH 91 05/28/2018   Current Outpatient Medications on File Prior to Visit  Medication Sig  . aspirin 81 MG tablet Take 81 mg by mouth daily.  Marland Kitchen atenolol (TENORMIN) 25 MG tablet Take 1 tablet (25 mg  total) by mouth daily.  Marland Kitchen atorvastatin (LIPITOR) 20 MG tablet TAKE 1 TABLET BY MOUTH EVERY DAY  . Cholecalciferol (VITAMIN D PO) Take 8,000 Units by mouth daily.   . citalopram (CELEXA) 40 MG tablet TAKE 1 TABLET BY MOUTH EVERY DAY FOR MOOD  . finasteride (PROSCAR) 5 MG tablet TAKE 1 TABLET BY MOUTH EVERY DAY (Patient taking differently: Takes 1/4 tablet everyday)  . latanoprost (XALATAN) 0.005 % ophthalmic solution Place 1 drop into both eyes at bedtime.  Marland Kitchen losartan (COZAAR) 50 MG tablet Take 1 tablet (50 mg total) by mouth daily.  . Multiple Vitamins-Minerals (MULTIVITAMIN PO) Take 1 tablet by mouth daily.   . tadalafil (CIALIS) 20 MG tablet Take 1 tablet (20 mg total) by mouth daily as needed for erectile dysfunction.  Marland Kitchen testosterone cypionate (DEPOTESTOSTERONE CYPIONATE) 200 MG/ML injection INJECT 1.5- INTO THE MUSCLE EVERY 2 WEEKS  . timolol (BETIMOL) 0.5 % ophthalmic solution Place 1 drop into both eyes 2 (two) times daily.    No current facility-administered medications on file prior to visit.    Allergies  Allergen Reactions  . Ace Inhibitors     cough  . Other Itching    Synthetic opiods  Full Body itching   Past Medical History:  Diagnosis Date  . Anxiety   . Cervical stenosis of  spine   . Depression   . GERD (gastroesophageal reflux disease)   . Glaucoma   . Gout   . Hyperlipidemia   . Hypertension   . Hypogonadism male   . Prediabetes   . Vitamin D deficiency    Health Maintenance  Topic Date Due  . PNA vac Low Risk Adult (1 of 2 - PCV13) 02/10/2018  . INFLUENZA VACCINE  03/01/2019  . Fecal DNA (Cologuard)  03/22/2021  . TETANUS/TDAP  08/19/2022  . Hepatitis C Screening  Completed  . HIV Screening  Completed   Immunization History  Administered Date(s) Administered  . Influenza, High Dose Seasonal PF 09/02/2018  . PPD Test 01/02/2014, 01/21/2015  . Pneumococcal-Unspecified 08/23/1992  . Tdap 08/19/2012   03/22/2018  - Cologard - Negative - 3 year  f/u due Aug/Sept 2022  Past Surgical History:  Procedure Laterality Date  . LEFT HEART CATH AND CORONARY ANGIOGRAPHY N/A 04/11/2017   Procedure: LEFT HEART CATH AND CORONARY ANGIOGRAPHY;  Surgeon: Lyn RecordsSmith, Henry W, MD;  Location: [facility];  Service: Cardiovascular;  Laterality: N/A;   Family History  Problem Relation Age of Onset  . Liver cancer Mother   . Heart disease Father   . Heart disease Brother   . Hypertension Brother   . Heart disease Paternal Grandmother   . Stroke Paternal Grandmother   . Stroke Paternal Grandfather   . Healthy Son    Social History   Socioeconomic History  . Marital status: Married    Spouse name: Not on file  . Number of children: 1 son & 1 daughter  Occupational History  . retired  Tobacco Use  . Smoking status: Former Smoker    Packs/day: 1.50    Last attempt to quit: 08/23/2006    Years since quitting: 12.3  . Smokeless tobacco: Never Used  Substance and Sexual Activity  . Alcohol use: Yes    Alcohol/week: 7.0 standard drinks    Types: 7 Standard drinks or equivalent per week  . Drug use: No  . Sexual activity: Not on file    ROS Constitutional: Denies fever, chills, weight loss/gain, headaches, insomnia,  night sweats or change in appetite. Does c/o fatigue. Eyes: Denies redness, blurred vision, diplopia, discharge, itchy or watery eyes.  ENT: Denies discharge, congestion, post nasal drip, epistaxis, sore throat, earache, hearing loss, dental pain, Tinnitus, Vertigo, Sinus pain or snoring.  Cardio: Denies chest pain, palpitations, irregular heartbeat, syncope, dyspnea, diaphoresis, orthopnea, PND, claudication or edema Respiratory: denies cough, dyspnea, DOE, pleurisy, hoarseness, laryngitis or wheezing.  Gastrointestinal: Denies dysphagia, heartburn, reflux, water brash, pain, cramps, nausea, vomiting, bloating, diarrhea, constipation, hematemesis, melena, hematochezia, jaundice or hemorrhoids Genitourinary: Denies dysuria,  frequency,  discharge, hematuria or flank pain. Has urgency, nocturia x 2-3 & occasional hesitancy and double voiding. Musculoskeletal: Denies arthralgia, myalgia, stiffness, Jt. Swelling, pain, limp or strain/sprain. Denies Falls. Skin: Denies puritis, rash, hives, warts, acne, eczema or change in skin lesion Neuro: No weakness, tremor, incoordination, spasms, paresthesia or pain Psychiatric: Denies confusion, memory loss or sensory loss. Denies Depression. Endocrine: Denies change in weight, skin, hair change, nocturia, and paresthesia, diabetic polys, visual blurring or hyper / hypo glycemic episodes.  Heme/Lymph: No excessive bleeding, bruising or enlarged lymph nodes.  Physical Exam  BP 138/76   Pulse (!) 48   Temp (!) 97.1 F (36.2 C)   Resp 16   Ht 6' 1.5" (1.867 m)   Wt 208 lb (94.3 kg)   BMI 27.07 kg/m  General Appearance: Well nourished and well groomed and in no apparent distress.  Eyes: PERRLA, EOMs, conjunctiva no swelling or erythema, normal fundi and vessels. Sinuses: No frontal/maxillary tenderness ENT/Mouth: EACs patent / TMs  nl. Nares clear without erythema, swelling, mucoid exudates. Oral hygiene is good. No erythema, swelling, or exudate. Tongue normal, non-obstructing. Tonsils not swollen or erythematous. Hearing normal.  Neck: Supple, thyroid not palpable. No bruits, nodes or JVD. Respiratory: Respiratory effort normal.  BS equal and clear bilateral without rales, rhonci, wheezing or stridor. Cardio: Heart sounds are normal with regular rate and rhythm and no murmurs, rubs or gallops. Peripheral pulses are normal and equal bilaterally without edema. No aortic or femoral bruits. Chest: symmetric with normal excursions and percussion.  Abdomen: Soft, with Nl bowel sounds. Nontender, no guarding, rebound, hernias, masses, or organomegaly.  Lymphatics: Non tender without lymphadenopathy.  Musculoskeletal: Full ROM all peripheral extremities, joint stability, 5/5  strength, and normal gait. Skin: Warm and dry without rashes, lesions, cyanosis, clubbing or  ecchymosis.  Neuro: Cranial nerves intact, reflexes equal bilaterally. Normal muscle tone, no cerebellar symptoms. Sensation intact.  Pysch: Alert and oriented X 3 with normal affect, insight and judgment appropriate.   Assessment and Plan  1. Essential hypertension  - EKG 12-Lead - [place], RETROPERITNL ABD,  LTD - Urinalysis, Routine w reflex microscopic - Microalbumin / creatinine urine ratio - CBC with Differential/Platelet - COMPLETE METABOLIC PANEL WITH GFR - Magnesium - TSH  2. Hyperlipidemia, mixed  - EKG 12-Lead - [place], RETROPERITNL ABD,  LTD - Lipid panel - TSH  3. Abnormal glucose  - EKG 12-Lead - [place], RETROPERITNL ABD,  LTD - Hemoglobin A1c - Insulin, random  4. Vitamin D deficiency  - VITAMIN D 25 Hydroxyl  5. Testosterone Deficiency  - Testosterone  6. Screening for colon cancer  - POC Hemoccult Bld/Stl   7. Prostate cancer screening  - PSA  8. BPH with obstruction/lower urinary tract symptoms  - PSA - tamsulosin (FLOMAX) 0.4 MG CAPS capsule; Take 1 capsule at Bedtime for Prostate  Dispense: 90 capsule; Refill: 3  - advised to increase his Finasteride 5 mg x 1/4 tablet upto 1 whole tablet daily.   9. Screening for ischemic heart disease  - EKG 12-Lead  10. FHx: heart disease  - EKG 12-Lead - [place], RETROPERITNL ABD,  LTD  11. Former smoker  - EKG 12-Lead - [place], RETROPERITNL ABD,  LTD  12. Screening for AAA (aortic abdominal aneurysm)  - [place], RETROPERITNL ABD,  LTD  13. Medication management  - Urinalysis, Routine w reflex microscopic - Microalbumin / creatinine urine ratio - Testosterone - CBC with Differential/Platelet - COMPLETE METABOLIC PANEL WITH GFR - Magnesium - Lipid panel - TSH - Hemoglobin A1c - Insulin, random - VITAMIN D 25 Hydroxyl  14. Prediabetes  - EKG 12-Lead - [place], RETROPERITNL ABD,  LTD  15. Need for prophylactic  vaccination against Streptococcus pneumoniae (pneumococcus)  - Pneumococcal conjugate vaccine 13-valent           Patient was counseled in prudent diet, weight control to achieve/maintain BMI less than 25, BP monitoring, regular exercise and medications as discussed.  Discussed med effects and SE's. Routine screening labs and tests as requested with regular follow-up as recommended. I discussed the assessment and treatment plan as above with the patient. The patient was provided an opportunity to ask questions and all were answered. The patient agreed with the plan and demonstrated an understanding of the instructions.  I provided  minutes  over 40 minutes of exam, counseling, chart review and  complex critical decision making was performed  Marinus Maw, MD

## 2018-12-17 NOTE — Patient Instructions (Signed)
- Vit D  And Vit C 1,000 are recommended to help protect against the Covid_19 and other Corona viruses.   - Also it's recommended to take Zinc 50 mg to help protect against the Covid_19  And best place to get  is also on [employer] and don't pay more than 6-8 cents /pill !   ================================ Coronavirus (COVID-19) Are you at risk?  Are you at risk for the Coronavirus (COVID-19)?  To be considered HIGH RISK for Coronavirus (COVID-19), you have to meet the following criteria:  . Traveled to [place], [place], [place], [place] or [place]; or in the [place] to [place], [place], [place]  . or [place]; and have fever, cough, and shortness of breath within the last 2 weeks of travel OR . Been in close contact with a person diagnosed with COVID-19 within the last 2 weeks and have  . fever, cough,and shortness of breath .  . IF YOU DO NOT MEET THESE CRITERIA, YOU ARE CONSIDERED LOW RISK FOR COVID-19.  What to do if you are HIGH RISK for COVID-19?  . If you are having a medical emergency, call 911. . Seek medical care right away. Before you go to a doctor's office, urgent care or emergency department, .  call ahead and tell them about your recent travel, contact with someone diagnosed with COVID-19  .  and your symptoms.  . You should receive instructions from your physician's office regarding next steps of care.  . When you arrive at healthcare provider, tell the healthcare staff immediately you have returned from  . visiting [place], [place], [place], [place] or [place]; or traveled in the [place] to [place], [place],  . [place] or [place] in the last two weeks or you have been in close contact with a person diagnosed with  . COVID-19 in the last 2 weeks.   . Tell the health care staff about your symptoms: fever, cough and shortness of breath. . After you have been seen by a medical provider, you will be either: o Tested for (COVID-19) and discharged  home on quarantine except to seek medical care if  o symptoms worsen, and asked to  - Stay home and avoid contact with others until you get your results (4-5 days)  - Avoid travel on public transportation if possible (such as bus, train, or airplane) or o Sent to the Emergency Department by EMS for evaluation, COVID-19 testing  and  o possible admission depending on your condition and test results.  What to do if you are LOW RISK for COVID-19?  Reduce your risk of any infection by using the same precautions used for avoiding the common cold or flu:  . Wash your hands often with soap and warm water for at least 20 seconds.  If soap and water are not readily available,  . use an alcohol-based hand sanitizer with at least 60% alcohol.  . If coughing or sneezing, cover your mouth and nose by coughing or sneezing into the elbow areas of your shirt or coat, .  into a tissue or into your sleeve (not your hands). . Avoid shaking hands with others and consider head nods or verbal greetings only. . Avoid touching your eyes, nose, or mouth with unwashed hands.  . Avoid close contact with people who are sick. . Avoid places or events with large numbers of people in one location, like concerts or sporting events. . Carefully consider travel plans you have or are   making. . If you are planning any travel outside or inside the [place], visit the CDC's Travelers' Health webpage for the latest health notices. . If you have some symptoms but not all symptoms, continue to monitor at home and seek medical attention  . if your symptoms worsen. . If you are having a medical emergency, call 911. >>>>>>>>>>>>>>>>>>>>>>>>>>>>>>>>>>>>>>>>>>>>>>>>>>>>>>> We Do NOT Approve of  [facility] [place] Soliciting Our Patients  To Do Home Visits  & We Do NOT Approve of [employer] > > > > > > > > > > > > > > > > > > > > > > > > > > > > > > > > > > >  > > > >   Preventive Care for Adults  A healthy  lifestyle and preventive care can promote health and wellness. Preventive health guidelines for men include the following key practices:  A routine yearly physical is a good way to check with your health care provider about your health and preventative screening. It is a chance to share any concerns and updates on your health and to receive a thorough exam.  Visit your dentist for a routine exam and preventative care every 6 months. Brush your teeth twice a day and floss once a day. Good oral hygiene prevents tooth decay and gum disease.  The frequency of eye exams is based on your age, health, family medical history, use of contact lenses, and other factors. Follow your health care provider's recommendations for frequency of eye exams.  Eat a healthy diet. Foods such as vegetables, fruits, whole grains, low-fat dairy products, and lean protein foods contain the nutrients you need without too many calories. Decrease your intake of foods high in solid fats, added sugars, and salt. Eat the right amount of calories for you. Get information about a proper diet from your health care provider, if necessary.  Regular physical exercise is one of the most important things you can do for your health. Most adults should get at least 150 minutes of moderate-intensity exercise (any activity that increases your heart rate and causes you to sweat) each week. In addition, most adults need muscle-strengthening exercises on 2 or more days a week.  Maintain a healthy weight. The body mass index (BMI) is a screening tool to identify possible weight problems. It provides an estimate of body fat based on height and weight. Your health care provider can find your BMI and can help you achieve or maintain a healthy weight. For adults 20 years and older:  A BMI below 18.5 is considered underweight.  A BMI of 18.5 to 24.9 is normal.  A BMI of 25 to 29.9 is considered overweight.  A BMI of 30 and above is considered  obese.  Maintain normal blood lipids and cholesterol levels by exercising and minimizing your intake of saturated fat. Eat a balanced diet with plenty of fruit and vegetables. Blood tests for lipids and cholesterol should begin at age 20 and be repeated every 5 years. If your lipid or cholesterol levels are high, you are over 50, or you are at high risk for heart disease, you may need your cholesterol levels checked more frequently. Ongoing high lipid and cholesterol levels should be treated with medicines if diet and exercise are not working.  If you smoke, find out from your health care provider how to quit. If you do not use tobacco, do not start.  Lung cancer screening is recommended for adults aged 55-80 years   who are at high risk for developing lung cancer because of a history of smoking. A yearly low-dose CT scan of the lungs is recommended for people who have at least a 30-pack-year history of smoking and are a current smoker or have quit within the past 15 years. A pack year of smoking is smoking an average of 1 pack of cigarettes a day for 1 year (for example: 1 pack a day for 30 years or 2 packs a day for 15 years). Yearly screening should continue until the smoker has stopped smoking for at least 15 years. Yearly screening should be stopped for people who develop a health problem that would prevent them from having lung cancer treatment.  If you choose to drink alcohol, do not have more than 2 drinks per day. One drink is considered to be 12 ounces (355 mL) of beer, 5 ounces (148 mL) of wine, or 1.5 ounces (44 mL) of liquor.  Avoid use of street drugs. Do not share needles with anyone. Ask for help if you need support or instructions about stopping the use of drugs.  High blood pressure causes heart disease and increases the risk of stroke. Your blood pressure should be checked at least every 1-2 years. Ongoing high blood pressure should be treated with medicines, if weight loss and exercise  are not effective.  If you are 45-79 years old, ask your health care provider if you should take aspirin to prevent heart disease.  Diabetes screening involves taking a blood sample to check your fasting blood sugar level. Testing should be considered at a younger age or be carried out more frequently if you are overweight and have at least 1 risk factor for diabetes.  Colorectal cancer can be detected and often prevented. Most routine colorectal cancer screening begins at the age of 50 and continues through age 75. However, your health care provider may recommend screening at an earlier age if you have risk factors for colon cancer. On a yearly basis, your health care provider may provide home test kits to check for hidden blood in the stool. Use of a small camera at the end of a tube to directly examine the colon (sigmoidoscopy or colonoscopy) can detect the earliest forms of colorectal cancer. Talk to your health care provider about this at age 50, when routine screening begins. Direct exam of the colon should be repeated every 5-10 years through age 75, unless early forms of precancerous polyps or small growths are found.  Hepatitis C blood testing is recommended for all people born from 1945 through 1965 and any individual with known risks for hepatitis C.  Screening for abdominal aortic aneurysm (AAA)  by ultrasound is recommended for people who have history of high blood pressure or who are current or former smokers.  Healthy men should  receive prostate-specific antigen (PSA) blood tests as part of routine cancer screening. Talk with your health care provider about prostate cancer screening.  Testicular cancer screening is  recommended for adult males. Screening includes self-exam, a health care provider exam, and other screening tests. Consult with your health care provider about any symptoms you have or any concerns you have about testicular cancer.  Use sunscreen. Apply sunscreen liberally  and repeatedly throughout the day. You should seek shade when your shadow is shorter than you. Protect yourself by wearing long sleeves, pants, a wide-brimmed hat, and sunglasses year round, whenever you are outdoors.  Once a month, do a whole-body skin exam, using a mirror   to look at the skin on your back. Tell your health care provider about new moles, moles that have irregular borders, moles that are larger than a pencil eraser, or moles that have changed in shape or color.  Stay current with required vaccines (immunizations).  Influenza vaccine. All adults should be immunized every year.  Tetanus, diphtheria, and acellular pertussis (Td, Tdap) vaccine. An adult who has not previously received Tdap or who does not know his vaccine status should receive 1 dose of Tdap. This initial dose should be followed by tetanus and diphtheria toxoids (Td) booster doses every 10 years. Adults with an unknown or incomplete history of completing a 3-dose immunization series with Td-containing vaccines should begin or complete a primary immunization series including a Tdap dose. Adults should receive a Td booster every 10 years.  Zoster vaccine. One dose is recommended for adults aged 60 years or older unless certain conditions are present.    PREVNAR - Pneumococcal 13-valent conjugate (PCV13) vaccine. When indicated, a person who is uncertain of his immunization history and has no record of immunization should receive the PCV13 vaccine. An adult aged 19 years or older who has certain medical conditions and has not been previously immunized should receive 1 dose of PCV13 vaccine. This PCV13 should be followed with a dose of pneumococcal polysaccharide (PPSV23) vaccine. The PPSV23 vaccine dose should be obtained 1 or more year(s)after the dose of PCV13 vaccine. An adult aged 19 years or older who has certain medical conditions and previously received 1 or more doses of PPSV23 vaccine should receive 1 dose of PCV13.  The PCV13 vaccine dose should be obtained 1 or more years after the last PPSV23 vaccine dose.    PNEUMOVAX - Pneumococcal polysaccharide (PPSV23) vaccine. When PCV13 is also indicated, PCV13 should be obtained first. All adults aged 65 years and older should be immunized. An adult younger than age 65 years who has certain medical conditions should be immunized. Any person who resides in a nursing home or long-term care facility should be immunized. An adult smoker should be immunized. People with an immunocompromised condition and certain other conditions should receive both PCV13 and PPSV23 vaccines. People with human immunodeficiency virus (HIV) infection should be immunized as soon as possible after diagnosis. Immunization during chemotherapy or radiation therapy should be avoided. Routine use of PPSV23 vaccine is not recommended for American Indians, [place] Natives, or people younger than 65 years unless there are medical conditions that require PPSV23 vaccine. When indicated, people who have unknown immunization and have no record of immunization should receive PPSV23 vaccine. One-time revaccination 5 years after the first dose of PPSV23 is recommended for people aged 19-64 years who have chronic kidney failure, nephrotic syndrome, asplenia, or immunocompromised conditions. People who received 1-2 doses of PPSV23 before age 65 years should receive another dose of PPSV23 vaccine at age 65 years or later if at least 5 years have passed since the previous dose. Doses of PPSV23 are not needed for people immunized with PPSV23 at or after age 65 years.    Hepatitis A vaccine. Adults who wish to be protected from this disease, have certain high-risk conditions, work with hepatitis A-infected animals, work in hepatitis A research labs, or travel to or work in countries with a high rate of hepatitis A should be immunized. Adults who were previously unvaccinated and who anticipate close contact with an  international adoptee during the first 60 days after arrival in the [place] from a country with a   high rate of hepatitis A should be immunized.    Hepatitis B vaccine. Adults should be immunized if they wish to be protected from this disease, have certain high-risk conditions, may be exposed to blood or other infectious body fluids, are household contacts or sex partners of hepatitis B positive people, are clients or workers in certain care facilities, or travel to or work in countries with a high rate of hepatitis B.   Preventive Service / Frequency   Ages 65 and over  Blood pressure check.  Lipid and cholesterol check.  Lung cancer screening. / Every year if you are aged 55-80 years and have a 30-pack-year history of smoking and currently smoke or have quit within the past 15 years. Yearly screening is stopped once you have quit smoking for at least 15 years or develop a health problem that would prevent you from having lung cancer treatment.  Fecal occult blood test (FOBT) of stool. You may not have to do this test if you get a colonoscopy every 10 years.  Flexible sigmoidoscopy** or colonoscopy.** / Every 5 years for a flexible sigmoidoscopy or every 10 years for a colonoscopy beginning at age 50 and continuing until age 75.  Hepatitis C blood test.** / For all people born from 1945 through 1965 and any individual with known risks for hepatitis C.  Abdominal aortic aneurysm (AAA) screening./ Screening current or former smokers or have Hypertension.  Skin self-exam. / Monthly.  Influenza vaccine. / Every year.  Tetanus, diphtheria, and acellular pertussis (Tdap/Td) vaccine.** / 1 dose of Td every 10 years.   Zoster vaccine.** / 1 dose for adults aged 60 years or older.         Pneumococcal 13-valent conjugate (PCV13) vaccine.    Pneumococcal polysaccharide (PPSV23) vaccine.     Hepatitis A vaccine.** / Consult your health care provider.  Hepatitis B vaccine.** /  Consult your health care provider. Screening for abdominal aortic aneurysm (AAA)  by ultrasound is recommended for people who have history of high blood pressure or who are current or former smokers. ++++++++++ Recommend Adult Low Dose Aspirin or  coated  Aspirin 81 mg daily  To reduce risk of Colon Cancer 20 %,  Skin Cancer 26 % ,  Malignant Melanoma 46%  and  Pancreatic cancer 60% ++++++++++++++++++++++ Vitamin D goal  is between 70-100.  Please make sure that you are taking your Vitamin D as directed.  It is very important as a natural anti-inflammatory  helping hair, skin, and nails, as well as reducing stroke and heart attack risk.  It helps your bones and helps with mood. It also decreases numerous cancer risks so please take it as directed.  Low Vit D is associated with a 200-300% higher risk for CANCER  and 200-300% higher risk for HEART   ATTACK  &  STROKE.   ...................................... It is also associated with higher death rate at younger ages,  autoimmune diseases like Rheumatoid arthritis, Lupus, Multiple Sclerosis.    Also many other serious conditions, like depression, Alzheimer's Dementia, infertility, muscle aches, fatigue, fibromyalgia - just to name a few. ++++++++++++++++++++++ Recommend the book "The END of DIETING" by Dr Joel Fuhrman  & the book "The END of DIABETES " by Dr Joel Fuhrman At [employer]com - get book & Audio CD's    Being diabetic has a  300% increased risk for heart attack, stroke, cancer, and alzheimer- type vascular dementia. It is very important that you work harder with diet by avoiding   all foods that are white. Avoid white rice (brown & wild rice is OK), white potatoes (sweetpotatoes in moderation is OK), White bread or wheat bread or anything made out of white flour like bagels, donuts, rolls, buns, biscuits, cakes, pastries, cookies, pizza crust, and pasta (made from white flour & egg whites) - vegetarian pasta or spinach or wheat  pasta is OK. Multigrain breads like Arnold's or [employer], or multigrain sandwich thins or flatbreads.  Diet, exercise and weight loss can reverse and cure diabetes in the early stages.  Diet, exercise and weight loss is very important in the control and prevention of complications of diabetes which affects every system in your body, ie. Brain - dementia/stroke, eyes - glaucoma/blindness, heart - heart attack/heart failure, kidneys - dialysis, stomach - gastric paralysis, intestines - malabsorption, nerves - severe painful neuritis, circulation - gangrene & loss of a leg(s), and finally cancer and Alzheimers.    I recommend avoid fried & greasy foods,  sweets/candy, white rice (brown or wild rice or Quinoa is OK), white potatoes (sweet potatoes are OK) - anything made from white flour - bagels, doughnuts, rolls, buns, biscuits,white and wheat breads, pizza crust and traditional pasta made of white flour & egg white(vegetarian pasta or spinach or wheat pasta is OK).  Multi-grain bread is OK - like multi-grain flat bread or sandwich thins. Avoid alcohol in excess. Exercise is also important.    Eat all the vegetables you want - avoid meat, especially red meat and dairy - especially cheese.  Cheese is the most concentrated form of trans-fats which is the worst thing to clog up our arteries. Veggie cheese is OK which can be found in the fresh produce section at [employer] or [employer] or [employer]  ++++++++++++++++++++++ DASH Eating Plan  DASH stands for "Dietary Approaches to Stop Hypertension."   The DASH eating plan is a healthy eating plan that has been shown to reduce high blood pressure (hypertension). Additional health benefits may include reducing the risk of type 2 diabetes mellitus, heart disease, and stroke. The DASH eating plan may also help with weight loss. WHAT DO I NEED TO KNOW ABOUT THE DASH EATING PLAN? For the DASH eating plan, you will follow these general  guidelines:  Choose foods with a percent daily value for sodium of less than 5% (as listed on the food label).  Use salt-free seasonings or herbs instead of table salt or sea salt.  Check with your health care provider or pharmacist before using salt substitutes.  Eat lower-sodium products, often labeled as "lower sodium" or "no salt added."  Eat fresh foods.  Eat more vegetables, fruits, and low-fat dairy products.  Choose whole grains. Look for the word "whole" as the first word in the ingredient list.  Choose fish   Limit sweets, desserts, sugars, and sugary drinks.  Choose heart-healthy fats.  Eat veggie cheese   Eat more home-cooked food and less restaurant, buffet, and fast food.  Limit fried foods.  Cook foods using methods other than frying.  Limit canned vegetables. If you do use them, rinse them well to decrease the sodium.  When eating at a restaurant, ask that your food be prepared with less salt, or no salt if possible.                      WHAT FOODS CAN I EAT? Read Dr Joel Fuhrman's books on The End of Dieting & The End of Diabetes  Grains Whole grain   or whole wheat bread. Brown rice. Whole grain or whole wheat pasta. Quinoa, bulgur, and whole grain cereals. Low-sodium cereals. Corn or whole wheat flour tortillas. Whole grain cornbread. Whole grain crackers. Low-sodium crackers.  Vegetables Fresh or frozen vegetables (raw, steamed, roasted, or grilled). Low-sodium or reduced-sodium tomato and vegetable juices. Low-sodium or reduced-sodium tomato sauce and paste. Low-sodium or reduced-sodium canned vegetables.   Fruits All fresh, canned (in natural juice), or frozen fruits.  Protein Products  All fish and seafood.  Dried beans, peas, or lentils. Unsalted nuts and seeds. Unsalted canned beans.  Dairy Low-fat dairy products, such as skim or 1% milk, 2% or reduced-fat cheeses, low-fat ricotta or cottage cheese, or plain low-fat yogurt. Low-sodium or  reduced-sodium cheeses.  Fats and Oils Tub margarines without trans fats. Light or reduced-fat mayonnaise and salad dressings (reduced sodium). Avocado. Safflower, olive, or canola oils. Natural peanut or almond butter.  Other Unsalted popcorn and pretzels. The items listed above may not be a complete list of recommended foods or beverages. Contact your dietitian for more options.  ++++++++++++++++++++  WHAT FOODS ARE NOT RECOMMENDED? Grains/ White flour or wheat flour White bread. White pasta. White rice. Refined cornbread. Bagels and croissants. Crackers that contain trans fat.  Vegetables  Creamed or fried vegetables. Vegetables in a . Regular canned vegetables. Regular canned tomato sauce and paste. Regular tomato and vegetable juices.  Fruits Dried fruits. Canned fruit in light or heavy syrup. Fruit juice.  Meat and Other Protein Products Meat in general - RED meat & White meat.  Fatty cuts of meat. Ribs, chicken wings, all processed meats as bacon, sausage, bologna, salami, fatback, hot dogs, bratwurst and packaged luncheon meats.  Dairy Whole or 2% milk, cream, half-and-half, and cream cheese. Whole-fat or sweetened yogurt. Full-fat cheeses or blue cheese. Non-dairy creamers and whipped toppings. Processed cheese, cheese spreads, or cheese curds.  Condiments Onion and garlic salt, seasoned salt, table salt, and sea salt. Canned and packaged gravies. Worcestershire sauce. Tartar sauce. Barbecue sauce. Teriyaki sauce. Soy sauce, including reduced sodium. Steak sauce. Fish sauce. Oyster sauce. Cocktail sauce. Horseradish. Ketchup and mustard. Meat flavorings and tenderizers. Bouillon cubes. Hot sauce. Tabasco sauce. Marinades. Taco seasonings. Relishes.  Fats and Oils Butter, stick margarine, lard, shortening and bacon fat. Coconut, palm kernel, or palm oils. Regular salad dressings.  Pickles and olives. Salted popcorn and pretzels.  The items listed above may not be a  complete list of foods and beverages to avoid.    

## 2018-12-18 ENCOUNTER — Other Ambulatory Visit: Payer: Self-pay

## 2018-12-18 ENCOUNTER — Ambulatory Visit (INDEPENDENT_AMBULATORY_CARE_PROVIDER_SITE_OTHER): Payer: Medicare Other | Admitting: Internal Medicine

## 2018-12-18 VITALS — BP 138/76 | HR 48 | Temp 97.1°F | Resp 16 | Ht 73.5 in | Wt 208.0 lb

## 2018-12-18 DIAGNOSIS — E559 Vitamin D deficiency, unspecified: Secondary | ICD-10-CM

## 2018-12-18 DIAGNOSIS — Z79899 Other long term (current) drug therapy: Secondary | ICD-10-CM

## 2018-12-18 DIAGNOSIS — R7309 Other abnormal glucose: Secondary | ICD-10-CM

## 2018-12-18 DIAGNOSIS — Z125 Encounter for screening for malignant neoplasm of prostate: Secondary | ICD-10-CM

## 2018-12-18 DIAGNOSIS — E291 Testicular hypofunction: Secondary | ICD-10-CM

## 2018-12-18 DIAGNOSIS — Z8249 Family history of ischemic heart disease and other diseases of the circulatory system: Secondary | ICD-10-CM

## 2018-12-18 DIAGNOSIS — N138 Other obstructive and reflux uropathy: Secondary | ICD-10-CM

## 2018-12-18 DIAGNOSIS — E782 Mixed hyperlipidemia: Secondary | ICD-10-CM

## 2018-12-18 DIAGNOSIS — I10 Essential (primary) hypertension: Secondary | ICD-10-CM | POA: Diagnosis not present

## 2018-12-18 DIAGNOSIS — Z1211 Encounter for screening for malignant neoplasm of colon: Secondary | ICD-10-CM

## 2018-12-18 DIAGNOSIS — R7303 Prediabetes: Secondary | ICD-10-CM

## 2018-12-18 DIAGNOSIS — Z136 Encounter for screening for cardiovascular disorders: Secondary | ICD-10-CM

## 2018-12-18 DIAGNOSIS — Z87891 Personal history of nicotine dependence: Secondary | ICD-10-CM

## 2018-12-18 DIAGNOSIS — N401 Enlarged prostate with lower urinary tract symptoms: Secondary | ICD-10-CM | POA: Diagnosis not present

## 2018-12-18 DIAGNOSIS — Z23 Encounter for immunization: Secondary | ICD-10-CM

## 2018-12-18 MED ORDER — TAMSULOSIN HCL 0.4 MG PO CAPS: ORAL_CAPSULE | ORAL | 3 refills | Status: DC

## 2018-12-19 LAB — CBC WITH DIFFERENTIAL/PLATELET
Absolute Monocytes: 578 cells/uL (ref 200–950)
Basophils Absolute: 59 cells/uL (ref 0–200)
Basophils Relative: 1.2 %
Eosinophils Absolute: 108 cells/uL (ref 15–500)
Eosinophils Relative: 2.2 %
HCT: 53.8 % — ABNORMAL HIGH (ref 38.5–50.0)
Hemoglobin: 18.3 g/dL — ABNORMAL HIGH (ref 13.2–17.1)
Lymphs Abs: 1318 cells/uL (ref 850–3900)
MCH: 31 pg (ref 27.0–33.0)
MCHC: 34 g/dL (ref 32.0–36.0)
MCV: 91 fL (ref 80.0–100.0)
MPV: 12.8 fL — ABNORMAL HIGH (ref 7.5–12.5)
Monocytes Relative: 11.8 %
Neutro Abs: 2837 cells/uL (ref 1500–7800)
Neutrophils Relative %: 57.9 %
Platelets: 140 10*3/uL (ref 140–400)
RBC: 5.91 10*6/uL — ABNORMAL HIGH (ref 4.20–5.80)
RDW: 12.2 % (ref 11.0–15.0)
Total Lymphocyte: 26.9 %
WBC: 4.9 10*3/uL (ref 3.8–10.8)

## 2018-12-19 LAB — COMPLETE METABOLIC PANEL WITH GFR
AG Ratio: 2.3 (calc) (ref 1.0–2.5)
ALT: 22 U/L (ref 9–46)
AST: 21 U/L (ref 10–35)
Albumin: 4.8 g/dL (ref 3.6–5.1)
Alkaline phosphatase (APISO): 41 U/L (ref 35–144)
BUN/Creatinine Ratio: 13 (calc) (ref 6–22)
BUN: 17 mg/dL (ref 7–25)
CO2: 29 mmol/L (ref 20–32)
Calcium: 9.9 mg/dL (ref 8.6–10.3)
Chloride: 102 mmol/L (ref 98–110)
Creat: 1.29 mg/dL — ABNORMAL HIGH (ref 0.70–1.25)
GFR, Est African American: 67 mL/min/{1.73_m2} (ref >=60)
GFR, Est Non African American: 58 mL/min/{1.73_m2} — ABNORMAL LOW (ref >=60)
Globulin: 2.1 g/dL (calc) (ref 1.9–3.7)
Glucose, Bld: 89 mg/dL (ref 65–99)
Potassium: 5.2 mmol/L (ref 3.5–5.3)
Sodium: 140 mmol/L (ref 135–146)
Total Bilirubin: 0.7 mg/dL (ref 0.2–1.2)
Total Protein: 6.9 g/dL (ref 6.1–8.1)

## 2018-12-19 LAB — HEMOGLOBIN A1C
Hgb A1c MFr Bld: 5.3 % of total Hgb (ref <5.7)
Mean Plasma Glucose: 105 (calc)
eAG (mmol/L): 5.8 (calc)

## 2018-12-19 LAB — LIPID PANEL
Cholesterol: 169 mg/dL (ref <200)
HDL: 71 mg/dL (ref >=40)
LDL Cholesterol (Calc): 76 mg/dL (calc)
Non-HDL Cholesterol (Calc): 98 mg/dL (calc) (ref <130)
Total CHOL/HDL Ratio: 2.4 (calc) (ref <5.0)
Triglycerides: 136 mg/dL (ref <150)

## 2018-12-19 LAB — URINALYSIS, ROUTINE W REFLEX MICROSCOPIC
Bilirubin Urine: NEGATIVE
Glucose, UA: NEGATIVE
Hgb urine dipstick: NEGATIVE
Ketones, ur: NEGATIVE
Leukocytes,Ua: NEGATIVE
Nitrite: NEGATIVE
Protein, ur: NEGATIVE
Specific Gravity, Urine: 1.008 (ref 1.001–1.03)
pH: 6.5 (ref 5.0–8.0)

## 2018-12-19 LAB — MICROALBUMIN / CREATININE URINE RATIO
Creatinine, Urine: 48 mg/dL (ref 20–320)
Microalb Creat Ratio: 6 mcg/mg creat (ref <30)
Microalb, Ur: 0.3 mg/dL

## 2018-12-19 LAB — TSH: TSH: 1.64 mIU/L (ref 0.40–4.50)

## 2018-12-19 LAB — PSA: PSA: 0.5 ng/mL (ref <=4.0)

## 2018-12-19 LAB — MAGNESIUM: Magnesium: 2.1 mg/dL (ref 1.5–2.5)

## 2018-12-19 LAB — INSULIN, RANDOM: Insulin: 8.6 u[IU]/mL

## 2018-12-19 LAB — VITAMIN D 25 HYDROXY (VIT D DEFICIENCY, FRACTURES): Vit D, 25-Hydroxy: 86 ng/mL (ref 30–100)

## 2018-12-19 LAB — TESTOSTERONE: Testosterone: 244 ng/dL — ABNORMAL LOW (ref 250–827)

## 2019-01-10 ENCOUNTER — Other Ambulatory Visit: Payer: Self-pay | Admitting: Adult Health

## 2019-01-10 MED ORDER — ATENOLOL 25 MG PO TABS: 25.0000 mg | ORAL_TABLET | Freq: Every day | ORAL | 1 refills | Status: DC

## 2019-01-14 DIAGNOSIS — H401131 Primary open-angle glaucoma, bilateral, mild stage: Secondary | ICD-10-CM | POA: Diagnosis not present

## 2019-02-14 ENCOUNTER — Other Ambulatory Visit: Payer: Self-pay | Admitting: Internal Medicine

## 2019-02-21 ENCOUNTER — Other Ambulatory Visit: Payer: Self-pay | Admitting: Physician Assistant

## 2019-02-21 DIAGNOSIS — I10 Essential (primary) hypertension: Secondary | ICD-10-CM

## 2019-03-31 DIAGNOSIS — Z87891 Personal history of nicotine dependence: Secondary | ICD-10-CM | POA: Insufficient documentation

## 2019-03-31 NOTE — Progress Notes (Signed)
MEDICARE ANNUAL WELLNESS VISIT AND FOLLOW UP Assessment:   Diagnoses and all orders for this visit:  Annual Medicare preventive visit  CAD in native artery  Hasn't had chest pain since cath/workup by Dr. Rennis GoldenHilty Control blood pressure, cholesterol, glucose, increase exercise.  Continue ASA  Essential hypertension Continue medication Monitor blood pressure at home; call if consistently over 130/80 Continue DASH diet.   Reminder to go to the ER if any CP, SOB, nausea, dizziness, severe HA, changes vision/speech, left arm numbness and tingling and jaw pain.  Gastroesophageal reflux disease, esophagitis presence not specified Well managed on current medications Discussed diet, avoiding triggers and other lifestyle changes  Testosterone Deficiency - continue to monitor, states medication is helping with symptoms of low T.   Vitamin D deficiency At goal at recent check; continue to recommend supplementation for goal of 70-100 Defer vitamin D level  Other abnormal glucose Recent A1Cs at goal Discussed diet/exercise, weight management  Defer A1C; check CMP  Open-angle glaucoma of right eye, unspecified glaucoma stage, unspecified open-angle glaucoma type Continue drops, followed by ophthalmology  Mixed hyperlipidemia Continue medications, LDL goal <70 Continue low cholesterol diet and exercise.  Check lipid panel.   Medication management CBC, CMP/GFR  Idiopathic gout, unspecified chronicity, unspecified site Not on allopurinol, no recent flares   Overweight (BMI 25.0-29.9) Long discussion about weight loss, diet, and exercise Recommended diet heavy in fruits and veggies and low in animal meats, cheeses, and dairy products, appropriate calorie intake Discussed appropriate weight for height  Follow up at next visit  Former smoker Normal CXR 2018, not candidate for CT screening Declines CXR today  Over 30 minutes of exam, counseling, chart review, and critical decision  making was performed  Future Appointments  Date Time Provider Department Center  07/02/2019  2:30 PM Lucky CowboyMcKeown, William, MD [facility] None  01/27/2020 10:00 AM Lucky CowboyMcKeown, William, MD [facility] None     Plan:   During the course of the visit the patient was educated and counseled about appropriate screening and preventive services including:    Pneumococcal vaccine   Influenza vaccine  Prevnar 13  Td vaccine  Screening electrocardiogram  Colorectal cancer screening  Diabetes screening  Glaucoma screening  Nutrition counseling    Subjective:  Cory Carrillo is a 68 y.o. male who presents for Medicare Annual Wellness Visit and 3 month follow up for HTN, hyperlipidemia, glucose management, and vitamin D Def.   Patient had a heart cath in Sept 2018 by Dr Rennis GoldenHilty after c/o angia which demonstrated findings of minimal CAD, and was recommended medical treatment.   He has some chronic left hip/knee pain intermittently thought to be r/t lower back, has had injection in the past that worked well by Dr. Ethelene Halamos at [facility]. He reports significantly improved in the interim. Pain is typical after long periods of standing. He declines interventions at this time.   BMI is Body mass index is 26.65 kg/m., he has not been working on diet, walks 15 min twice a week without problems. He is trying to "eat better" - choosing better.  Wt Readings from Last 3 Encounters:  04/02/19 204 lb 12.8 oz (92.9 kg)  12/18/18 208 lb (94.3 kg)  09/02/18 213 lb (96.6 kg)   His blood pressure has been controlled at home, today their BP is BP: 110/70 He does not workout. He denies chest pain, shortness of breath, dizziness.   He is on cholesterol medication (atorvastatin 20 mg daily) and denies myalgias. His cholesterol is at  goal. The cholesterol last visit was:   Lab Results  Component Value Date   CHOL 169 12/18/2018   HDL 71 12/18/2018   LDLCALC 76 12/18/2018   TRIG 136 12/18/2018   CHOLHDL 2.4  12/18/2018   He has not been working on diet and exercise for glucose management, and denies foot ulcerations, increased appetite, nausea, paresthesia of the feet, polydipsia, polyuria, visual disturbances, vomiting and weight loss. Last A1C in the office was:  Lab Results  Component Value Date   HGBA1C 5.3 12/18/2018   Last GFR Lab Results  Component Value Date   GFRNONAA 58 (L) 12/18/2018   Patient is on Vitamin D supplement.   Lab Results  Component Value Date   VD25OH 4886 12/18/2018     He has a history of testosterone deficiency and is on testosterone replacement, last injection was 1 week ago. He states that the testosterone helps with his energy, libido, muscle mass. Lab Results  Component Value Date   TESTOSTERONE 244 (L) 12/18/2018   He is also on cialis 20 mg PRN and feels this works well for him.   Patient has diagnosis of gout, hasn't been on a daily agent, but denies recent flares.  Lab Results  Component Value Date   LABURIC 7.1 03/13/2017     Medication Review:  Current Outpatient Medications (Endocrine & Metabolic):  .  testosterone cypionate (DEPOTESTOSTERONE CYPIONATE) 200 MG/ML injection, Inject 2 ml into Muscle every 2 weeks  Current Outpatient Medications (Cardiovascular):  .  atenolol (TENORMIN) 25 MG tablet, Take 1 tablet (25 mg total) by mouth daily. Marland Kitchen.  atorvastatin (LIPITOR) 20 MG tablet, TAKE 1 TABLET BY MOUTH EVERY DAY .  losartan (COZAAR) 50 MG tablet, Take 1 tablet (50 mg total) by mouth daily. .  tadalafil (CIALIS) 20 MG tablet, Take 1 tablet (20 mg total) by mouth daily as needed for erectile dysfunction.   Current Outpatient Medications (Analgesics):  .  aspirin 81 MG tablet, Take 81 mg by mouth daily.   Current Outpatient Medications (Other):  Marland Kitchen.  Cholecalciferol (VITAMIN D PO), Take 8,000 Units by mouth daily.  .  citalopram (CELEXA) 40 MG tablet, TAKE 1 TABLET BY MOUTH EVERY DAY FOR MOOD .  finasteride (PROSCAR) 5 MG tablet, TAKE 1  TABLET BY MOUTH EVERY DAY (Patient taking differently: daily. ) .  latanoprost (XALATAN) 0.005 % ophthalmic solution, Place 1 drop into both eyes at bedtime. .  Multiple Vitamins-Minerals (MULTIVITAMIN PO), Take 1 tablet by mouth daily.  .  tamsulosin (FLOMAX) 0.4 MG CAPS capsule, Take 1 capsule at Bedtime for Prostate .  timolol (BETIMOL) 0.5 % ophthalmic solution, Place 1 drop into both eyes 2 (two) times daily.   Allergies: Allergies  Allergen Reactions  . Ace Inhibitors     cough  . Other Itching    Synthetic opiods  Full Body itching    Current Problems (verified) has Essential hypertension; Mixed hyperlipidemia; GERD ; Other abnormal glucose; Gout; Testosterone Deficiency; Vitamin D deficiency; Medication management; Open-angle glaucoma; CAD in native artery; Overweight (BMI 25.0-29.9); and Former smoker on their problem list.  Screening Tests Immunization History  Administered Date(s) Administered  . Influenza, High Dose Seasonal PF 09/02/2018  . PPD Test 01/02/2014, 01/21/2015  . Pneumococcal Conjugate-13 12/18/2018  . Pneumococcal-Unspecified 08/23/1992  . Tdap 08/19/2012   Preventative care: Last colonoscopy: had in 2004, declines further, willing to do cologuard Cologuard: 02/2018  CXR: 2018, declines today  Former smoker - 36 pack year history quit in 2000  Prior vaccinations: TD or Tdap: 2014  Influenza: 08/2018,  Pneumococcal: 1994, due 11/2019 Prevnar13: 11/2018 Shingles/Zostavax: declines   Names of Other Physician/Practitioners you currently use: 1. [facility] here for primary care 2. [facility], Dr. Lorin Picket, eye doctor, last visit 2020, goes 3-4 times a year, monitoring glaucoma/IOP 3. Dr. Dimas Millin, dentist, last visit 2020, q24m  Patient Care Team: Lucky Cowboy, MD as PCP - General (Internal Medicine) Sharrell Ku, MD as Consulting Physician (Gastroenterology)  Surgical: He  has a past surgical  history that includes LEFT HEART CATH AND CORONARY ANGIOGRAPHY (N/A, 04/11/2017). Family His family history includes Healthy in his son; Heart disease in his brother, father, and paternal grandmother; Hypertension in his brother; Liver cancer in his mother; Stroke in his paternal grandfather and paternal grandmother. Social history  He reports that he quit smoking about 20 years ago. He has a 37.50 pack-year smoking history. He has never used smokeless tobacco. He reports current alcohol use of about 7.0 standard drinks of alcohol per week. He reports that he does not use drugs.  MEDICARE WELLNESS OBJECTIVES: Physical activity: Current Exercise Habits: Home exercise routine, Type of exercise: walking, Time (Minutes): 15, Frequency (Times/Week): 2, Weekly Exercise (Minutes/Week): 30, Intensity: Mild, Exercise limited by: orthopedic condition(s) Cardiac risk factors: Cardiac Risk Factors include: advanced age (>41men, >32 women);dyslipidemia;hypertension;male gender;sedentary lifestyle;smoking/ tobacco exposure Depression/mood screen:   Depression screen [facility] 2/9 04/02/2019  Decreased Interest 0  Down, Depressed, Hopeless 0  PHQ - 2 Score 0    ADLs:  In your present state of health, do you have any difficulty performing the following activities: 04/02/2019 12/17/2018  Hearing? N N  Vision? N N  Difficulty concentrating or making decisions? N N  Walking or climbing stairs? N N  Dressing or bathing? N N  Doing errands, shopping? N N  Some recent data might be hidden     Cognitive Testing  Alert? Yes  Normal Appearance?Yes  Oriented to person? Yes  Place? Yes   Time? Yes  Recall of three objects?  Yes  Can perform simple calculations? Yes  Displays appropriate judgment?Yes  Can read the correct time from a watch face?Yes  EOL planning: Does Patient Have a Medical Advance Directive?: No Would patient like information on creating a medical advance directive?: Yes (MAU/Ambulatory/Procedural  Areas - Information given)   Objective:   Today's Vitals   04/02/19 1355  BP: 110/70  Pulse: (!) 52  Temp: 97.7 F (36.5 C)  SpO2: 98%  Weight: 204 lb 12.8 oz (92.9 kg)  Height: 6' 1.5" (1.867 m)   Body mass index is 26.65 kg/m.  General appearance: alert, no distress, WD/WN, male HEENT: normocephalic, sclerae anicteric, TMs pearly, nares patent, no discharge or erythema, pharynx normal Oral cavity: MMM, no lesions Neck: supple, no lymphadenopathy, no thyromegaly, no masses Heart: RRR, normal S1, S2, no murmurs Lungs: CTA bilaterally, no wheezes, rhonchi, or rales Abdomen: +bs, soft, non tender, non distended, no masses, no hepatomegaly, no splenomegaly Musculoskeletal: nontender, no swelling, no obvious deformity Extremities: no edema, no cyanosis, no clubbing Pulses: 2+ symmetric, upper and lower extremities, normal cap refill Neurological: alert, oriented x 3, CN2-12 intact, strength normal upper extremities and lower extremities, sensation normal throughout, DTRs 2+ throughout, no cerebellar signs, gait normal Psychiatric: normal affect, behavior normal, pleasant   Medicare Attestation I have personally reviewed: The patient's medical and social history Their use of alcohol, tobacco or illicit drugs Their current medications and supplements The patient's functional ability  including ADLs,fall risks, home safety risks, cognitive, and hearing and visual impairment Diet and physical activities Evidence for depression or mood disorders  The patient's weight, height, BMI, and visual acuity have been recorded in the chart.  I have made referrals, counseling, and provided education to the patient based on review of the above and I have provided the patient with a written personalized care plan for preventive services.     Izora Ribas, NP   04/02/2019

## 2019-04-02 ENCOUNTER — Ambulatory Visit (INDEPENDENT_AMBULATORY_CARE_PROVIDER_SITE_OTHER): Payer: PRIVATE HEALTH INSURANCE | Admitting: Adult Health

## 2019-04-02 ENCOUNTER — Other Ambulatory Visit: Payer: Self-pay

## 2019-04-02 ENCOUNTER — Encounter: Payer: Self-pay | Admitting: Adult Health

## 2019-04-02 VITALS — BP 110/70 | HR 52 | Temp 97.7°F | Ht 73.5 in | Wt 204.8 lb

## 2019-04-02 DIAGNOSIS — R7309 Other abnormal glucose: Secondary | ICD-10-CM

## 2019-04-02 DIAGNOSIS — E663 Overweight: Secondary | ICD-10-CM

## 2019-04-02 DIAGNOSIS — I2510 Atherosclerotic heart disease of native coronary artery without angina pectoris: Secondary | ICD-10-CM | POA: Diagnosis not present

## 2019-04-02 DIAGNOSIS — M1000 Idiopathic gout, unspecified site: Secondary | ICD-10-CM

## 2019-04-02 DIAGNOSIS — Z0001 Encounter for general adult medical examination with abnormal findings: Secondary | ICD-10-CM

## 2019-04-02 DIAGNOSIS — E559 Vitamin D deficiency, unspecified: Secondary | ICD-10-CM

## 2019-04-02 DIAGNOSIS — E291 Testicular hypofunction: Secondary | ICD-10-CM | POA: Diagnosis not present

## 2019-04-02 DIAGNOSIS — E782 Mixed hyperlipidemia: Secondary | ICD-10-CM

## 2019-04-02 DIAGNOSIS — Z87891 Personal history of nicotine dependence: Secondary | ICD-10-CM

## 2019-04-02 DIAGNOSIS — R6889 Other general symptoms and signs: Secondary | ICD-10-CM

## 2019-04-02 DIAGNOSIS — I10 Essential (primary) hypertension: Secondary | ICD-10-CM

## 2019-04-02 DIAGNOSIS — K219 Gastro-esophageal reflux disease without esophagitis: Secondary | ICD-10-CM

## 2019-04-02 DIAGNOSIS — Z0000 Encounter for general adult medical examination without abnormal findings: Secondary | ICD-10-CM

## 2019-04-02 DIAGNOSIS — Z79899 Other long term (current) drug therapy: Secondary | ICD-10-CM

## 2019-04-02 DIAGNOSIS — H4010X0 Unspecified open-angle glaucoma, stage unspecified: Secondary | ICD-10-CM

## 2019-04-02 NOTE — Patient Instructions (Addendum)
  Mr. Cory Carrillo , Thank you for taking time to come for your Medicare Wellness Visit. I appreciate your ongoing commitment to your health goals. Please review the following plan we discussed and let me know if I can assist you in the future.   These are the goals we discussed: Goals    . Blood Pressure < 130/80    . Exercise 150 min/wk Moderate Activity    . LDL CALC < 100       This is a list of the screening recommended for you and due dates:  Health Maintenance  Topic Date Due  . Flu Shot  05/01/2019*  . Pneumonia vaccines (2 of 2 - PPSV23) 12/18/2019  . Cologuard (Stool DNA test)  03/22/2021  . Tetanus Vaccine  08/19/2022  .  Hepatitis C: One time screening is recommended by Center for Disease Control  (CDC) for  adults born from [birth year] through 1963.   Completed  *Topic was postponed. The date shown is not the original due date.    Know what a healthy weight is for you (roughly BMI <25) and aim to maintain this  Aim for 7+ servings of fruits and vegetables daily  65-80+ fluid ounces of water or unsweet tea for healthy kidneys  Limit to max 1 drink of alcohol per day; avoid smoking/tobacco  Limit animal fats in diet for cholesterol and heart health - choose grass fed whenever available  Avoid highly processed foods, and foods high in saturated/trans fats  Aim for low stress - take time to unwind and care for your mental health  Aim for 150 min of moderate intensity exercise weekly for heart health, and weights twice weekly for bone health  Aim for 7-9 hours of sleep daily

## 2019-04-03 LAB — COMPLETE METABOLIC PANEL WITH GFR
AG Ratio: 2.2 (calc) (ref 1.0–2.5)
ALT: 18 U/L (ref 9–46)
AST: 21 U/L (ref 10–35)
Albumin: 4.4 g/dL (ref 3.6–5.1)
Alkaline phosphatase (APISO): 33 U/L — ABNORMAL LOW (ref 35–144)
BUN: 12 mg/dL (ref 7–25)
CO2: 28 mmol/L (ref 20–32)
Calcium: 9.4 mg/dL (ref 8.6–10.3)
Chloride: 102 mmol/L (ref 98–110)
Creat: 0.87 mg/dL (ref 0.70–1.25)
GFR, Est African American: 104 mL/min/{1.73_m2} (ref >=60)
GFR, Est Non African American: 90 mL/min/{1.73_m2} (ref >=60)
Globulin: 2 g/dL (calc) (ref 1.9–3.7)
Glucose, Bld: 90 mg/dL (ref 65–99)
Potassium: 4.8 mmol/L (ref 3.5–5.3)
Sodium: 137 mmol/L (ref 135–146)
Total Bilirubin: 0.9 mg/dL (ref 0.2–1.2)
Total Protein: 6.4 g/dL (ref 6.1–8.1)

## 2019-04-03 LAB — CBC WITH DIFFERENTIAL/PLATELET
Absolute Monocytes: 486 cells/uL (ref 200–950)
Basophils Absolute: 52 cells/uL (ref 0–200)
Basophils Relative: 1.2 %
Eosinophils Absolute: 69 cells/uL (ref 15–500)
Eosinophils Relative: 1.6 %
HCT: 50.4 % — ABNORMAL HIGH (ref 38.5–50.0)
Hemoglobin: 16.9 g/dL (ref 13.2–17.1)
Lymphs Abs: 1286 cells/uL (ref 850–3900)
MCH: 31.2 pg (ref 27.0–33.0)
MCHC: 33.5 g/dL (ref 32.0–36.0)
MCV: 93 fL (ref 80.0–100.0)
MPV: 13 fL — ABNORMAL HIGH (ref 7.5–12.5)
Monocytes Relative: 11.3 %
Neutro Abs: 2408 cells/uL (ref 1500–7800)
Neutrophils Relative %: 56 %
Platelets: 128 10*3/uL — ABNORMAL LOW (ref 140–400)
RBC: 5.42 10*6/uL (ref 4.20–5.80)
RDW: 13 % (ref 11.0–15.0)
Total Lymphocyte: 29.9 %
WBC: 4.3 10*3/uL (ref 3.8–10.8)

## 2019-04-03 LAB — LIPID PANEL
Cholesterol: 146 mg/dL (ref <200)
HDL: 66 mg/dL (ref >=40)
LDL Cholesterol (Calc): 66 mg/dL (calc)
Non-HDL Cholesterol (Calc): 80 mg/dL (calc) (ref <130)
Total CHOL/HDL Ratio: 2.2 (calc) (ref <5.0)
Triglycerides: 64 mg/dL (ref <150)

## 2019-04-03 LAB — MAGNESIUM: Magnesium: 1.9 mg/dL (ref 1.5–2.5)

## 2019-04-03 LAB — TSH: TSH: 1.12 mIU/L (ref 0.40–4.50)

## 2019-04-24 DIAGNOSIS — R040 Epistaxis: Secondary | ICD-10-CM | POA: Diagnosis not present

## 2019-05-03 ENCOUNTER — Other Ambulatory Visit: Payer: Self-pay | Admitting: Internal Medicine

## 2019-05-08 ENCOUNTER — Other Ambulatory Visit: Payer: Self-pay | Admitting: Internal Medicine

## 2019-05-16 DIAGNOSIS — Z23 Encounter for immunization: Secondary | ICD-10-CM | POA: Diagnosis not present

## 2019-05-19 DIAGNOSIS — H401131 Primary open-angle glaucoma, bilateral, mild stage: Secondary | ICD-10-CM | POA: Diagnosis not present

## 2019-05-21 ENCOUNTER — Other Ambulatory Visit: Payer: Self-pay | Admitting: Internal Medicine

## 2019-05-21 DIAGNOSIS — E782 Mixed hyperlipidemia: Secondary | ICD-10-CM

## 2019-05-21 MED ORDER — ATORVASTATIN CALCIUM 20 MG PO TABS: ORAL_TABLET | ORAL | 3 refills | Status: DC

## 2019-06-08 ENCOUNTER — Other Ambulatory Visit: Payer: Self-pay | Admitting: Internal Medicine

## 2019-06-29 ENCOUNTER — Other Ambulatory Visit: Payer: Self-pay | Admitting: Internal Medicine

## 2019-07-01 ENCOUNTER — Encounter: Payer: Self-pay | Admitting: Internal Medicine

## 2019-07-01 NOTE — Progress Notes (Signed)
History of Present Illness:      This very nice 68 y.o. MWM  presents for 6 month follow up with HTN, HLD, Pre-Diabetes and Vitamin D Deficiency.       Patient is treated for HTN (1994) & BP has been controlled at home. Today's BP is at goal - 122/84.  Heart cath in 2018 found insignificant CAD. Patient has had no complaints of any cardiac type chest pain, palpitations, dyspnea / orthopnea / PND, dizziness, claudication, or dependent edema.      Hyperlipidemia is controlled with diet & meds. Patient denies myalgias or other med SE's. Last Lipids were at goal:  Lab Results  Component Value Date   CHOL 146 04/02/2019   HDL 66 04/02/2019   LDLCALC 66 04/02/2019   TRIG 64 04/02/2019   CHOLHDL 2.2 04/02/2019        Also, the patient has history of PreDiabetes (A1c 5.7% / 2012)  and has had no symptoms of reactive hypoglycemia, diabetic polys, paresthesias or visual blurring.  Last A1c was Normal & at goal:  Lab Results  Component Value Date   HGBA1C 5.3 12/18/2018      Patient has hx/o Testosterone Deficiency & is on parenteral replacement with improved stamina & sense of well being.         Further, the patient also has history of Vitamin D Deficiency ("14" / 2008) and supplements vitamin D without any suspected side-effects. Last vitamin D was at goal:  Lab Results  Component Value Date   VD25OH 86 12/18/2018    Current Outpatient Medications on File Prior to Visit  Medication Sig  . aspirin 81 MG tablet Take 81 mg by mouth daily.  Marland Kitchen atenolol (TENORMIN) 25 MG tablet Take 1 tablet (25 mg total) by mouth daily.  Marland Kitchen atorvastatin (LIPITOR) 20 MG tablet Take 1 tablet Daily for Cholesterol  . Cholecalciferol (VITAMIN D PO) Take 8,000 Units by mouth daily.   . citalopram (CELEXA) 40 MG tablet Take 1 tablet Daily for Mood  . dorzolamide-timolol (COSOPT) 22.3-6.8 MG/ML ophthalmic solution 22.3 mLs.  . finasteride (PROSCAR) 5 MG tablet Take 1 tablet Daily for Prostate  .  latanoprost (XALATAN) 0.005 % ophthalmic solution Place 1 drop into both eyes at bedtime.  Marland Kitchen losartan (COZAAR) 50 MG tablet Take 1 tablet (50 mg total) by mouth daily.  . Multiple Vitamins-Minerals (MULTIVITAMIN PO) Take 1 tablet by mouth daily.   . tadalafil (CIALIS) 20 MG tablet Take 1/2 to 1 tablet every 2 to 3 days as needed for XXXX  . tamsulosin (FLOMAX) 0.4 MG CAPS capsule Take 1 capsule at Bedtime for Prostate  . testosterone cypionate (DEPOTESTOSTERONE CYPIONATE) 200 MG/ML injection Inject 2 ml into Muscle every 2 weeks  . timolol (BETIMOL) 0.5 % ophthalmic solution Place 1 drop into both eyes 2 (two) times daily.    No current facility-administered medications on file prior to visit.     Allergies  Allergen Reactions  . Ace Inhibitors Cough  &Itching      . Synthetic opioids -Full Body itching    PMHx:   Past Medical History:  Diagnosis Date  . Anxiety   . Cervical stenosis of spine   . Depression   . GERD (gastroesophageal reflux disease)   . Glaucoma   . Gout   . Hyperlipidemia   . Hypertension   . Hypogonadism male   . Prediabetes   . Vitamin D deficiency    Immunization History  Administered  Date(s) Administered  . Influenza, High Dose Seasonal PF 09/02/2018  . Influenza-Unspecified 05/15/2019  . PPD Test 01/02/2014, 01/21/2015  . Pneumococcal Conjugate-13 12/18/2018  . Pneumococcal-Unspecified 08/23/1992  . Tdap 08/19/2012   Past Surgical History:  Procedure Laterality Date  . LEFT HEART CATH AND CORONARY ANGIOGRAPHY N/A 04/11/2017   Procedure: LEFT HEART CATH AND CORONARY ANGIOGRAPHY;  Surgeon: Belva Crome, MD;  Location: [facility];  Service: Cardiovascular;  Laterality: N/A;    FHx:    Reviewed / unchanged  SHx:    Reviewed / unchanged   Systems Review:  Constitutional: Denies fever, chills, wt changes, headaches, insomnia, fatigue, night sweats, change in appetite. Eyes: Denies redness, blurred vision, diplopia, discharge, itchy,  watery eyes.  ENT: Denies discharge, congestion, post nasal drip, epistaxis, sore throat, earache, hearing loss, dental pain, tinnitus, vertigo, sinus pain, snoring.  CV: Denies chest pain, palpitations, irregular heartbeat, syncope, dyspnea, diaphoresis, orthopnea, PND, claudication or edema. Respiratory: denies cough, dyspnea, DOE, pleurisy, hoarseness, laryngitis, wheezing.  Gastrointestinal: Denies dysphagia, odynophagia, heartburn, reflux, water brash, abdominal pain or cramps, nausea, vomiting, bloating, diarrhea, constipation, hematemesis, melena, hematochezia  or hemorrhoids. Genitourinary: Denies dysuria, frequency, urgency, nocturia, hesitancy, discharge, hematuria or flank pain. Musculoskeletal: Denies arthralgias, myalgias, stiffness, jt. swelling, pain, limping or strain/sprain.  Skin: Denies pruritus, rash, hives, warts, acne, eczema or change in skin lesion(s). Neuro: No weakness, tremor, incoordination, spasms, paresthesia or pain. Psychiatric: Denies confusion, memory loss or sensory loss. Endo: Denies change in weight, skin or hair change.  Heme/Lymph: No excessive bleeding, bruising or enlarged lymph nodes.  Physical Exam  BP 122/84   Pulse 71   Temp 97.6 F (36.4 C)   Wt 206 lb (93.4 kg)   SpO2 97%   BMI 26.81 kg/m   Appears  well nourished, well groomed  and in no distress.  Eyes: PERRLA, EOMs, conjunctiva no swelling or erythema. Sinuses: No frontal/maxillary tenderness ENT/Mouth: EAC's clear, TM's nl w/o erythema, bulging. Nares clear w/o erythema, swelling, exudates. Oropharynx clear without erythema or exudates. Oral hygiene is good. Tongue normal, non obstructing. Hearing intact.  Neck: Supple. Thyroid not palpable. Car 2+/2+ without bruits, nodes or JVD. Chest: Respirations nl with BS clear & equal w/o rales, rhonchi, wheezing or stridor.  Cor: Heart sounds normal w/ regular rate and rhythm without sig. murmurs, gallops, clicks or rubs. Peripheral pulses  normal and equal  without edema.  Abdomen: Soft & bowel sounds normal. Non-tender w/o guarding, rebound, hernias, masses or organomegaly.  Lymphatics: Unremarkable.  Musculoskeletal: Full ROM all peripheral extremities, joint stability, 5/5 strength and normal gait.  Skin: Warm, dry without exposed rashes, lesions or ecchymosis apparent.  Neuro: Cranial nerves intact, reflexes equal bilaterally. Sensory-motor testing grossly intact. Tendon reflexes grossly intact.  Pysch: Alert & oriented x 3.  Insight and judgement nl & appropriate. No ideations.  Assessment and Plan:  1. Essential hypertension  - Continue medication, monitor blood pressure at home.  - Continue DASH diet.  Reminder to go to the ER if any CP,  SOB, nausea, dizziness, severe HA, changes vision/speech.  - CBC with Diff - COMPLETE METABOLIC PANEL WITH GFR - Magnesium - TSH  2. Hyperlipidemia, mixed  - Continue diet/meds, exercise,& lifestyle modifications.  - Continue monitor periodic cholesterol/liver & renal functions   - Lipid Profile - TSH  3. Abnormal glucose  - Continue diet, exercise  - Lifestyle modifications.  - Monitor appropriate labs.   - Hemoglobin A1c ([facility]) - Insulin, random  4. Vitamin D  deficiency  - Continue supplementation.  - Vitamin D (25 hydroxy)  5. Testosterone Deficiency  - Testosterone, Total  6. Medication management  - CBC with Diff - COMPLETE METABOLIC PANEL WITH GFR - Magnesium - Lipid Profile - TSH - Hemoglobin A1c ([facility]) - Insulin, random - Vitamin D (25 hydroxy) - Testosterone, Total        Discussed  regular exercise, BP monitoring, weight control to achieve/maintain BMI less than 25 and discussed med and SE's. Recommended labs to assess and monitor clinical status with further disposition pending results of labs.  I discussed the assessment and treatment plan with the patient. The patient was provided an opportunity to ask questions and all were  answered. The patient agreed with the plan and demonstrated an understanding of the instructions.  I provided over 30 minutes of exam, counseling, chart review and  complex critical decision making.  Marinus MawWilliam D Eddye Broxterman, MD

## 2019-07-01 NOTE — Patient Instructions (Signed)

## 2019-07-02 ENCOUNTER — Encounter: Payer: Self-pay | Admitting: Internal Medicine

## 2019-07-02 ENCOUNTER — Other Ambulatory Visit: Payer: Self-pay

## 2019-07-02 ENCOUNTER — Ambulatory Visit (INDEPENDENT_AMBULATORY_CARE_PROVIDER_SITE_OTHER): Payer: Medicare Other | Admitting: Internal Medicine

## 2019-07-02 VITALS — BP 122/84 | HR 71 | Temp 97.6°F | Resp 12 | Ht 73.5 in | Wt 206.0 lb

## 2019-07-02 DIAGNOSIS — E559 Vitamin D deficiency, unspecified: Secondary | ICD-10-CM

## 2019-07-02 DIAGNOSIS — E291 Testicular hypofunction: Secondary | ICD-10-CM | POA: Diagnosis not present

## 2019-07-02 DIAGNOSIS — I2510 Atherosclerotic heart disease of native coronary artery without angina pectoris: Secondary | ICD-10-CM | POA: Diagnosis not present

## 2019-07-02 DIAGNOSIS — R7309 Other abnormal glucose: Secondary | ICD-10-CM | POA: Diagnosis not present

## 2019-07-02 DIAGNOSIS — I10 Essential (primary) hypertension: Secondary | ICD-10-CM | POA: Diagnosis not present

## 2019-07-02 DIAGNOSIS — E782 Mixed hyperlipidemia: Secondary | ICD-10-CM

## 2019-07-02 DIAGNOSIS — Z79899 Other long term (current) drug therapy: Secondary | ICD-10-CM | POA: Diagnosis not present

## 2019-07-03 LAB — INSULIN, RANDOM: Insulin: 4.1 u[IU]/mL

## 2019-07-03 LAB — CBC WITH DIFFERENTIAL/PLATELET
Absolute Monocytes: 556 cells/uL (ref 200–950)
Basophils Absolute: 51 cells/uL (ref 0–200)
Basophils Relative: 1 %
Eosinophils Absolute: 71 cells/uL (ref 15–500)
Eosinophils Relative: 1.4 %
HCT: 52.8 % — ABNORMAL HIGH (ref 38.5–50.0)
Hemoglobin: 17.9 g/dL — ABNORMAL HIGH (ref 13.2–17.1)
Lymphs Abs: 1464 cells/uL (ref 850–3900)
MCH: 31.2 pg (ref 27.0–33.0)
MCHC: 33.9 g/dL (ref 32.0–36.0)
MCV: 92.1 fL (ref 80.0–100.0)
MPV: 13.3 fL — ABNORMAL HIGH (ref 7.5–12.5)
Monocytes Relative: 10.9 %
Neutro Abs: 2958 cells/uL (ref 1500–7800)
Neutrophils Relative %: 58 %
Platelets: 142 10*3/uL (ref 140–400)
RBC: 5.73 10*6/uL (ref 4.20–5.80)
RDW: 13.5 % (ref 11.0–15.0)
Total Lymphocyte: 28.7 %
WBC: 5.1 10*3/uL (ref 3.8–10.8)

## 2019-07-03 LAB — LIPID PANEL
Cholesterol: 149 mg/dL (ref <200)
HDL: 67 mg/dL (ref >=40)
LDL Cholesterol (Calc): 62 mg/dL (calc)
Non-HDL Cholesterol (Calc): 82 mg/dL (calc) (ref <130)
Total CHOL/HDL Ratio: 2.2 (calc) (ref <5.0)
Triglycerides: 113 mg/dL (ref <150)

## 2019-07-03 LAB — COMPLETE METABOLIC PANEL WITH GFR
AG Ratio: 2 (calc) (ref 1.0–2.5)
ALT: 25 U/L (ref 9–46)
AST: 21 U/L (ref 10–35)
Albumin: 4.5 g/dL (ref 3.6–5.1)
Alkaline phosphatase (APISO): 39 U/L (ref 35–144)
BUN: 15 mg/dL (ref 7–25)
CO2: 28 mmol/L (ref 20–32)
Calcium: 9.6 mg/dL (ref 8.6–10.3)
Chloride: 101 mmol/L (ref 98–110)
Creat: 1.05 mg/dL (ref 0.70–1.25)
GFR, Est African American: 85 mL/min/{1.73_m2} (ref >=60)
GFR, Est Non African American: 74 mL/min/{1.73_m2} (ref >=60)
Globulin: 2.2 g/dL (calc) (ref 1.9–3.7)
Glucose, Bld: 90 mg/dL (ref 65–99)
Potassium: 4.6 mmol/L (ref 3.5–5.3)
Sodium: 137 mmol/L (ref 135–146)
Total Bilirubin: 0.8 mg/dL (ref 0.2–1.2)
Total Protein: 6.7 g/dL (ref 6.1–8.1)

## 2019-07-03 LAB — HEMOGLOBIN A1C
Hgb A1c MFr Bld: 4.9 % of total Hgb (ref <5.7)
Mean Plasma Glucose: 94 (calc)
eAG (mmol/L): 5.2 (calc)

## 2019-07-03 LAB — TESTOSTERONE: Testosterone: 1185 ng/dL — ABNORMAL HIGH (ref 250–827)

## 2019-07-03 LAB — MAGNESIUM: Magnesium: 2 mg/dL (ref 1.5–2.5)

## 2019-07-03 LAB — TSH: TSH: 1.2 mIU/L (ref 0.40–4.50)

## 2019-07-03 LAB — VITAMIN D 25 HYDROXY (VIT D DEFICIENCY, FRACTURES): Vit D, 25-Hydroxy: 91 ng/mL (ref 30–100)

## 2019-07-06 ENCOUNTER — Encounter: Payer: Self-pay | Admitting: Internal Medicine

## 2019-07-12 ENCOUNTER — Other Ambulatory Visit: Payer: Self-pay | Admitting: Adult Health

## 2019-08-14 ENCOUNTER — Other Ambulatory Visit: Payer: Self-pay | Admitting: Internal Medicine

## 2019-09-01 ENCOUNTER — Other Ambulatory Visit: Payer: Self-pay | Admitting: Internal Medicine

## 2019-09-13 ENCOUNTER — Ambulatory Visit: Payer: Medicare Other | Attending: Internal Medicine

## 2019-09-13 DIAGNOSIS — Z23 Encounter for immunization: Secondary | ICD-10-CM | POA: Insufficient documentation

## 2019-09-13 NOTE — Progress Notes (Signed)
   Covid-19 Vaccination Clinic  Name:  Cory Carrillo    MRN: [id number] DOB: 10-03-1950  09/13/2019  Cory Carrillo was observed post Covid-19 immunization for 15 minutes without incidence. He was provided with Vaccine Information Sheet and instruction to access the V-Safe system.   Cory Carrillo was instructed to call 911 with any severe reactions post vaccine: Marland Kitchen Difficulty breathing  . Swelling of your face and throat  . A fast heartbeat  . A bad rash all over your body  . Dizziness and weakness    Immunizations Administered    Name Date Dose VIS Date Route   Pfizer COVID-19 Vaccine 09/13/2019  1:06 PM 0.3 mL 07/11/2019 Intramuscular   Manufacturer: [employer], [employer]   Lot: [id number]   NDC: [address]-5

## 2019-09-22 DIAGNOSIS — H401131 Primary open-angle glaucoma, bilateral, mild stage: Secondary | ICD-10-CM | POA: Diagnosis not present

## 2019-10-06 ENCOUNTER — Ambulatory Visit: Payer: Medicare Other | Attending: Internal Medicine

## 2019-10-06 DIAGNOSIS — Z23 Encounter for immunization: Secondary | ICD-10-CM | POA: Insufficient documentation

## 2019-10-06 NOTE — Progress Notes (Signed)
   Covid-19 Vaccination Clinic  Name:  Cory Carrillo    MRN: [id number] DOB: 1951-05-26  10/06/2019  Mr. Cory Carrillo was observed post Covid-19 immunization for 15 minutes without incident. He was provided with Vaccine Information Sheet and instruction to access the V-Safe system.   Mr. Cory Carrillo was instructed to call 911 with any severe reactions post vaccine: Marland Kitchen Difficulty breathing  . Swelling of face and throat  . A fast heartbeat  . A bad rash all over body  . Dizziness and weakness   Immunizations Administered    Name Date Dose VIS Date Route   Pfizer COVID-19 Vaccine 10/06/2019 11:28 AM 0.3 mL 07/11/2019 Intramuscular   Manufacturer: [employer], [employer]   Lot: [id number]   NDC: [address]-1

## 2019-10-06 NOTE — Progress Notes (Signed)
3 MONTH FOLLOW UP   Assessment / Plan   Cory Carrillo was seen today for follow-up.  Diagnoses and all orders for this visit:  Essential hypertension Continue current medications:Atenolol 25mg , Losartan 50mg . Monitor blood pressure at home; call if consistently over 130/80 Continue DASH diet.   Reminder to go to the ER if any CP, SOB, nausea, dizziness, severe HA, changes vision/speech, left arm numbness and tingling and jaw pain. -     CBC with Differential/Platelet -     COMPLETE METABOLIC PANEL WITH GFR  Hyperlipidemia, mixed Continue medications: Atorvastatin 20mg  Discussed dietary and exercise modifications Low fat diet -     Lipid panel  Gastroesophageal reflux disease, unspecified whether esophagitis present Doing well at this time, no medications Diet discussed Monitor for triggers Avoid food with high acid content Avoid excessive cafeine Increase water intake  Abnormal glucose Discussed dietary and exercise modifications  Vitamin D deficiency Continue supplementation Taking Vitamin D 8,000 IU daily  Idiopathic gout, unspecified chronicity, unspecified site No medications at this time No recent flares Discussed dietary modifications Continue to monitor  Testosterone Deficiency Taking testosterone injections, 31ml Q2weeks No administration or injection site complications Has increased stamina, energy and overall well being  Open-angle glaucoma of right eye, unspecified glaucoma stage, unspecified open-angle glaucoma type Continue eye examinations Continue drops as prescribed  BPH with obstruction/lower urinary tract symptoms Doing well at this time Continue medications: Tamsulosin 0.4mg  Will continue to monitor Defer PSA this check  CAD in native artery Control blood pressure, lipids and glucose Disscused lifestyle modifications, diet & exercise Continue to monitor  Unstable angina [facility]) Doing well on current regiment Asymptomatic Continue follow up  with Cardiology as needed, Dr  Overweight (BMI 25.0-29.9) Discussed dietary and exercise modifications  Medication management Continued.    Over 30 minutes of face to face interview, exam, counseling, chart review, and critical decision making was performed  Future Appointments  Date Time Provider Department Center  01/27/2020 10:00 AM Rennis Golden, MD [facility] None  04/28/2020  2:00 PM 01/29/2020, NP [facility] None     Subjective:  Cory Carrillo is a 69 y.o. male who presents for  3 month follow up for HTN, HLD, abnormal glucose management, and vitamin D Def.   Patient had a heart cath in Sept 2018 by Dr Cory Carrillo after c/o angia which demonstrated findings of minimal CAD, and recommended medical treatment.   He has some chronic left hip/knee pain intermittently thought to be r/t lower back, has had injection in the past that worked well by Dr. 71 at [facility]. He reports significantly improved in the interim. Pain is typical after long periods of standing. He declines interventions at this time.   BMI is Body mass index is 27.98 kg/m., he has not been working on diet, walks 15 min twice a week without problems. He is trying to "eat better" - choosing better.  Wt Readings from Last 3 Encounters:  10/07/19 215 lb (97.5 kg)  07/02/19 206 lb (93.4 kg)  04/02/19 204 lb 12.8 oz (92.9 kg)   His blood pressure has been controlled at home, today their BP is BP: 126/78 He does not workout. He denies chest pain, shortness of breath, dizziness.   He is on cholesterol medication (atorvastatin 20 mg daily) and denies myalgias. His cholesterol is at goal. The cholesterol last visit was:   Lab Results  Component Value Date   CHOL 149 07/02/2019   HDL 67 07/02/2019   LDLCALC 62 07/02/2019  TRIG 113 07/02/2019   CHOLHDL 2.2 07/02/2019   He has not been working on diet and exercise for glucose management, and denies foot ulcerations, increased appetite, nausea,  paresthesia of the feet, polydipsia, polyuria, visual disturbances, vomiting and weight loss. Last A1C in the office was:  Lab Results  Component Value Date   HGBA1C 4.9 07/02/2019   Last GFR Lab Results  Component Value Date   GFRNONAA 74 07/02/2019   Patient is on Vitamin D supplement and at goal last ov, defer lab today. Lab Results  Component Value Date   VD25OH 56 07/02/2019     He has a history of testosterone deficiency and is on testosterone replacement, last injection was 1 week ago. He states that the testosterone helps with his energy, libido, muscle mass. Lab Results  Component Value Date   TESTOSTERONE 1,185 (H) 07/02/2019   He is also on cialis 20 mg PRN and feels this works well for him.   Patient has diagnosis of gout, hasn't been on a daily agent, but denies recent flares.  Lab Results  Component Value Date   LABURIC 7.1 03/13/2017     Medication Review:  Current Outpatient Medications (Endocrine & Metabolic):  .  testosterone cypionate (DEPOTESTOSTERONE CYPIONATE) 200 MG/ML injection, INJECT 2 ML INTO MUSCLE EVERY 2 WEEKS  Current Outpatient Medications (Cardiovascular):  .  atenolol (TENORMIN) 25 MG tablet, Take 1 tablet Daily for BP .  atorvastatin (LIPITOR) 20 MG tablet, Take 1 tablet Daily for Cholesterol .  losartan (COZAAR) 50 MG tablet, Take 1 tablet (50 mg total) by mouth daily. .  tadalafil (CIALIS) 20 MG tablet, Take 1/2 to 1 tablet every 2 to 3 days as Needed for XXXX   Current Outpatient Medications (Analgesics):  .  aspirin 81 MG tablet, Take 81 mg by mouth daily.   Current Outpatient Medications (Other):  Marland Kitchen  Ascorbic Acid (VITAMIN C) 1000 MG tablet, Take 1,000 mg by mouth daily. .  Cholecalciferol (VITAMIN D PO), Take 8,000 Units by mouth daily.  .  citalopram (CELEXA) 40 MG tablet, Take 1 tablet Daily for Mood .  finasteride (PROSCAR) 5 MG tablet, Take 1 tablet Daily for Prostate .  latanoprost (XALATAN) 0.005 % ophthalmic solution,  Place 1 drop into both eyes at bedtime. .  Multiple Vitamins-Minerals (MULTIVITAMIN PO), Take 1 tablet by mouth daily.  .  tamsulosin (FLOMAX) 0.4 MG CAPS capsule, Take 1 capsule at Bedtime for Prostate .  timolol (BETIMOL) 0.5 % ophthalmic solution, Place 1 drop into both eyes in the morning, at noon, and at bedtime.   Allergies: Allergies  Allergen Reactions  . Ace Inhibitors     cough  . Other Itching    Synthetic opiods  Full Body itching    Current Problems (verified) has Essential hypertension; Mixed hyperlipidemia; GERD ; Other abnormal glucose; Gout; Testosterone Deficiency; Vitamin D deficiency; Medication management; Open-angle glaucoma; CAD in native artery; Overweight (BMI 25.0-29.9); and Former smoker on their problem list.  Screening Tests Immunization History  Administered Date(s) Administered  . Influenza, High Dose Seasonal PF 09/02/2018  . Influenza-Unspecified 05/15/2019  . PFIZER SARS-COV-2 Vaccination 09/13/2019, 10/06/2019  . PPD Test 01/02/2014, 01/21/2015  . Pneumococcal Conjugate-13 12/18/2018  . Pneumococcal-Unspecified 08/23/1992  . Tdap 08/19/2012   Preventative care: Last colonoscopy: had in 2004, declines further, willing to do cologuard Cologuard: 02/2018  CXR: 2018, declines today  Former smoker - 36 pack year history quit in 2000  Prior vaccinations: TD or Tdap: 2014  Influenza: 08/2018,  Pneumococcal: 1994, due 11/2019 Prevnar13: 11/2018 Shingles/Zostavax: declines   Coid SARS2, two does completed 10/05/19   Names of Other Physician/Practitioners you currently use: 1. [facility] here for primary care 2. [facility], Dr. Nicki Reaper, eye doctor, last visit 2021, goes 3-4 times a year, monitoring glaucoma/IOP 3. Dr. Apolonio Schneiders, dentist, last visit 2021, q16m  Patient Care Team: Unk Pinto, MD as PCP - General (Internal Medicine) Richmond Campbell, MD as Consulting Physician  (Gastroenterology)  Surgical: He  has a past surgical history that includes LEFT HEART CATH AND CORONARY ANGIOGRAPHY (N/A, 04/11/2017). Family His family history includes Healthy in his son; Heart disease in his brother, father, and paternal grandmother; Hypertension in his brother; Liver cancer in his mother; Stroke in his paternal grandfather and paternal grandmother. Social history  He reports that he quit smoking about 21 years ago. He has a 37.50 pack-year smoking history. He has never used smokeless tobacco. He reports current alcohol use of about 7.0 standard drinks of alcohol per week. He reports that he does not use drugs.     Objective:   Today's Vitals   10/07/19 1433  BP: 126/78  Pulse: 60  Resp: 16  Temp: (!) 97.4 F (36.3 C)  Weight: 215 lb (97.5 kg)  Height: 6' 1.5" (1.867 m)   Body mass index is 27.98 kg/m.  General appearance: alert, no distress, WD/WN, male HEENT: normocephalic, sclerae anicteric, TMs pearly, nares patent, no discharge or erythema, pharynx normal Oral cavity: MMM, no lesions Neck: supple, no lymphadenopathy, no thyromegaly, no masses Heart: RRR, normal S1, S2, no murmurs Lungs: CTA bilaterally, no wheezes, rhonchi, or rales Abdomen: +bs, soft, non tender, non distended, no masses, no hepatomegaly, no splenomegaly Musculoskeletal: nontender, no swelling, no obvious deformity Extremities: no edema, no cyanosis, no clubbing Pulses: 2+ symmetric, upper and lower extremities, normal cap refill Neurological: alert, oriented x 3, CN2-12 intact, strength normal upper extremities and lower extremities, sensation normal throughout, DTRs 2+ throughout, no cerebellar signs, gait normal Psychiatric: normal affect, behavior normal, pleasant     Garnet Sierras, NP [facility] 10/07/2019  3:030 PM

## 2019-10-07 ENCOUNTER — Other Ambulatory Visit: Payer: Self-pay

## 2019-10-07 ENCOUNTER — Ambulatory Visit (INDEPENDENT_AMBULATORY_CARE_PROVIDER_SITE_OTHER): Payer: Medicare Other | Admitting: Adult Health Nurse Practitioner

## 2019-10-07 ENCOUNTER — Encounter: Payer: Self-pay | Admitting: Adult Health Nurse Practitioner

## 2019-10-07 VITALS — BP 126/78 | HR 60 | Temp 97.4°F | Resp 16 | Ht 73.5 in | Wt 215.0 lb

## 2019-10-07 DIAGNOSIS — R7309 Other abnormal glucose: Secondary | ICD-10-CM

## 2019-10-07 DIAGNOSIS — I2510 Atherosclerotic heart disease of native coronary artery without angina pectoris: Secondary | ICD-10-CM

## 2019-10-07 DIAGNOSIS — E782 Mixed hyperlipidemia: Secondary | ICD-10-CM | POA: Diagnosis not present

## 2019-10-07 DIAGNOSIS — E559 Vitamin D deficiency, unspecified: Secondary | ICD-10-CM

## 2019-10-07 DIAGNOSIS — H4010X0 Unspecified open-angle glaucoma, stage unspecified: Secondary | ICD-10-CM

## 2019-10-07 DIAGNOSIS — Z79899 Other long term (current) drug therapy: Secondary | ICD-10-CM | POA: Diagnosis not present

## 2019-10-07 DIAGNOSIS — E663 Overweight: Secondary | ICD-10-CM | POA: Diagnosis not present

## 2019-10-07 DIAGNOSIS — N138 Other obstructive and reflux uropathy: Secondary | ICD-10-CM

## 2019-10-07 DIAGNOSIS — M1000 Idiopathic gout, unspecified site: Secondary | ICD-10-CM

## 2019-10-07 DIAGNOSIS — E291 Testicular hypofunction: Secondary | ICD-10-CM

## 2019-10-07 DIAGNOSIS — K219 Gastro-esophageal reflux disease without esophagitis: Secondary | ICD-10-CM

## 2019-10-07 DIAGNOSIS — I10 Essential (primary) hypertension: Secondary | ICD-10-CM | POA: Diagnosis not present

## 2019-10-07 DIAGNOSIS — I200 Unstable angina: Secondary | ICD-10-CM | POA: Diagnosis not present

## 2019-10-07 DIAGNOSIS — N401 Enlarged prostate with lower urinary tract symptoms: Secondary | ICD-10-CM | POA: Diagnosis not present

## 2019-10-07 NOTE — Patient Instructions (Signed)
[facility] Sinus Rinse / Neti Pot Use warm bottled or distilled water DO NOT use tap water! Use twice a day as needed This will help to sooth irritated sinuses and clear nasal congestion If using nasal sprays, do so after completing this.     We will contact you in 1-3 days with your lab results via MyChart.   Preventive Care for Adults A healthy lifestyle and preventive care can promote health and wellness. Preventive health guidelines for men include the following key practices:  A routine yearly physical is a good way to check with your health care provider about your health and preventative screening. It is a chance to share any concerns and updates on your health and to receive a thorough exam.  Visit your dentist for a routine exam and preventative care every 6 months. Brush your teeth twice a day and floss once a day. Good oral hygiene prevents tooth decay and gum disease.  The frequency of eye exams is based on your age, health, family medical history, use of contact lenses, and other factors. Follow your health care provider's recommendations for frequency of eye exams.  Eat a healthy diet. Foods such as vegetables, fruits, whole grains, low-fat dairy products, and lean protein foods contain the nutrients you need without too many calories. Decrease your intake of foods high in solid fats, added sugars, and salt. Eat the right amount of calories for you.Get information about a proper diet from your health care provider, if necessary.  Regular physical exercise is one of the most important things you can do for your health. Most adults should get at least 150 minutes of moderate-intensity exercise (any activity that increases your heart rate and causes you to sweat) each week. In addition, most adults need muscle-strengthening exercises on 2 or more days a week.  Maintain a healthy weight. The body mass index (BMI) is a screening tool to identify possible weight problems. It  provides an estimate of body fat based on height and weight. Your health care provider can find your BMI and can help you achieve or maintain a healthy weight.For adults 20 years and older:  A BMI below 18.5 is considered underweight.  A BMI of 18.5 to 24.9 is normal.  A BMI of 25 to 29.9 is considered overweight.  A BMI of 30 and above is considered obese.  Maintain normal blood lipids and cholesterol levels by exercising and minimizing your intake of saturated fat. Eat a balanced diet with plenty of fruit and vegetables. Blood tests for lipids and cholesterol should begin at age 56 and be repeated every 5 years. If your lipid or cholesterol levels are high, you are over 50, or you are at high risk for heart disease, you may need your cholesterol levels checked more frequently.Ongoing high lipid and cholesterol levels should be treated with medicines if diet and exercise are not working.  If you smoke, find out from your health care provider how to quit. If you do not use tobacco, do not start.  Lung cancer screening is recommended for adults aged 55-80 years who are at high risk for developing lung cancer because of a history of smoking. A yearly low-dose CT scan of the lungs is recommended for people who have at least a 30-pack-year history of smoking and are a current smoker or have quit within the past 15 years. A pack year of smoking is smoking an average of 1 pack of cigarettes a day for 1 year (for  example: 1 pack a day for 30 years or 2 packs a day for 15 years). Yearly screening should continue until the smoker has stopped smoking for at least 15 years. Yearly screening should be stopped for people who develop a health problem that would prevent them from having lung cancer treatment.  If you choose to drink alcohol, do not have more than 2 drinks per day. One drink is considered to be 12 ounces (355 mL) of beer, 5 ounces (148 mL) of wine, or 1.5 ounces (44 mL) of liquor.  Avoid use of  street drugs. Do not share needles with anyone. Ask for help if you need support or instructions about stopping the use of drugs.  High blood pressure causes heart disease and increases the risk of stroke. Your blood pressure should be checked at least every 1-2 years. Ongoing high blood pressure should be treated with medicines, if weight loss and exercise are not effective.  If you are 35-74 years old, ask your health care provider if you should take aspirin to prevent heart disease.  Diabetes screening involves taking a blood sample to check your fasting blood sugar level. Testing should be considered at a younger age or be carried out more frequently if you are overweight and have at least 1 risk factor for diabetes.  Colorectal cancer can be detected and often prevented. Most routine colorectal cancer screening begins at the age of 66 and continues through age 49. However, your health care provider may recommend screening at an earlier age if you have risk factors for colon cancer. On a yearly basis, your health care provider may provide home test kits to check for hidden blood in the stool. Use of a small camera at the end of a tube to directly examine the colon (sigmoidoscopy or colonoscopy) can detect the earliest forms of colorectal cancer. Talk to your health care provider about this at age 39, when routine screening begins. Direct exam of the colon should be repeated every 5-10 years through age 73, unless early forms of precancerous polyps or small growths are found.  Hepatitis C blood testing is recommended for all people born from [birth year] through 1963 and any individual with known risks for hepatitis C.  New guidelines recommend a once time screening for HIV.   Screening for abdominal aortic aneurysm (AAA)  by ultrasound is recommended for people who have history of high blood pressure or who are current or former smokers.  Healthy men should  receive prostate-specific antigen (PSA) blood  tests as part of routine cancer screening. Talk with your health care provider about prostate cancer screening.  Testicular cancer screening is  recommended for adult males. Screening includes self-exam, a health care provider exam, and other screening tests. Consult with your health care provider about any symptoms you have or any concerns you have about testicular cancer.  Use sunscreen. Apply sunscreen liberally and repeatedly throughout the day. You should seek shade when your shadow is shorter than you. Protect yourself by wearing long sleeves, pants, a wide-brimmed hat, and sunglasses year round, whenever you are outdoors.  Once a month, do a whole-body skin exam, using a mirror to look at the skin on your back. Tell your health care provider about new moles, moles that have irregular borders, moles that are larger than a pencil eraser, or moles that have changed in shape or color.  Stay current with required vaccines (immunizations).  Influenza vaccine. All adults should be immunized every year.  Tetanus, diphtheria,  and acellular pertussis (Td, Tdap) vaccine. An adult who has not previously received Tdap or who does not know his vaccine status should receive 1 dose of Tdap. This initial dose should be followed by tetanus and diphtheria toxoids (Td) booster doses every 10 years. Adults with an unknown or incomplete history of completing a 3-dose immunization series with Td-containing vaccines should begin or complete a primary immunization series including a Tdap dose. Adults should receive a Td booster every 10 years.  Zoster vaccine. One dose is recommended for adults aged 64 years or older unless certain conditions are present.    PREVNAR - Pneumococcal 13-valent conjugate (PCV13) vaccine. When indicated, a person who is uncertain of his immunization history and has no record of immunization should receive the PCV13 vaccine. An adult aged 33 years or older who has certain medical  conditions and has not been previously immunized should receive 1 dose of PCV13 vaccine. This PCV13 should be followed with a dose of pneumococcal polysaccharide (PPSV23) vaccine. The PPSV23 vaccine dose should be obtained at least 8 weeks after the dose of PCV13 vaccine. An adult aged 39 years or older who has certain medical conditions and previously received 1 or more doses of PPSV23 vaccine should receive 1 dose of PCV13. The PCV13 vaccine dose should be obtained 1 or more years after the last PPSV23 vaccine dose.    PNEUMOVAX - Pneumococcal polysaccharide (PPSV23) vaccine. When PCV13 is also indicated, PCV13 should be obtained first. All adults aged 72 years and older should be immunized. An adult younger than age 55 years who has certain medical conditions should be immunized. Any person who resides in a nursing home or long-term care facility should be immunized. An adult smoker should be immunized. People with an immunocompromised condition and certain other conditions should receive both PCV13 and PPSV23 vaccines. People with human immunodeficiency virus (HIV) infection should be immunized as soon as possible after diagnosis. Immunization during chemotherapy or radiation therapy should be avoided. Routine use of PPSV23 vaccine is not recommended for American Indians, [address], or people younger than 65 years unless there are medical conditions that require PPSV23 vaccine. When indicated, people who have unknown immunization and have no record of immunization should receive PPSV23 vaccine. One-time revaccination 5 years after the first dose of PPSV23 is recommended for people aged 19-64 years who have chronic kidney failure, nephrotic syndrome, asplenia, or immunocompromised conditions. People who received 1-2 doses of PPSV23 before age 65 years should receive another dose of PPSV23 vaccine at age 61 years or later if at least 5 years have passed since the previous dose. Doses of PPSV23 are not  needed for people immunized with PPSV23 at or after age 8 years.    Hepatitis A vaccine. Adults who wish to be protected from this disease, have certain high-risk conditions, work with hepatitis A-infected animals, work in hepatitis A research labs, or travel to or work in countries with a high rate of hepatitis A should be immunized. Adults who were previously unvaccinated and who anticipate close contact with an international adoptee during the first 60 days after arrival in the [place] from a country with a high rate of hepatitis A should be immunized.    Hepatitis B vaccine. Adults should be immunized if they wish to be protected from this disease, have certain high-risk conditions, may be exposed to blood or other infectious body fluids, are household contacts or sex partners of hepatitis B positive people, are clients or workers in  certain care facilities, or travel to or work in countries with a high rate of hepatitis B.   Preventive Service / Frequency   Ages 54 and over  Blood pressure check.  Lipid and cholesterol check.  Lung cancer screening. / Every year if you are aged 55-80 years and have a 30-pack-year history of smoking and currently smoke or have quit within the past 15 years. Yearly screening is stopped once you have quit smoking for at least 15 years or develop a health problem that would prevent you from having lung cancer treatment.  Fecal occult blood test (FOBT) of stool. You may not have to do this test if you get a colonoscopy every 10 years.  Flexible sigmoidoscopy** or colonoscopy.** / Every 5 years for a flexible sigmoidoscopy or every 10 years for a colonoscopy beginning at age 76 and continuing until age 63.  Hepatitis C blood test.** / For all people born from [birth year] through 1963 and any individual with known risks for hepatitis C.  Abdominal aortic aneurysm (AAA) screening./ Screening current or former smokers or have Hypertension.  Skin self-exam.  / Monthly.  Influenza vaccine. / Every year.  Tetanus, diphtheria, and acellular pertussis (Tdap/Td) vaccine.** / 1 dose of Td every 10 years.   Zoster vaccine.** / 1 dose for adults aged 64 years or older.         Pneumococcal 13-valent conjugate (PCV13) vaccine.    Pneumococcal polysaccharide (PPSV23) vaccine.     Hepatitis A vaccine.** / Consult your health care provider.  Hepatitis B vaccine.** / Consult your health care provider. Screening for abdominal aortic aneurysm (AAA)  by ultrasound is recommended for people who have history of high blood pressure or who are current or former smokers.

## 2019-10-08 LAB — COMPLETE METABOLIC PANEL WITH GFR
AG Ratio: 2.2 (calc) (ref 1.0–2.5)
ALT: 27 U/L (ref 9–46)
AST: 20 U/L (ref 10–35)
Albumin: 4.3 g/dL (ref 3.6–5.1)
Alkaline phosphatase (APISO): 35 U/L (ref 35–144)
BUN: 11 mg/dL (ref 7–25)
CO2: 29 mmol/L (ref 20–32)
Calcium: 9.2 mg/dL (ref 8.6–10.3)
Chloride: 104 mmol/L (ref 98–110)
Creat: 0.89 mg/dL (ref 0.70–1.25)
GFR, Est African American: 103 mL/min/{1.73_m2} (ref >=60)
GFR, Est Non African American: 89 mL/min/{1.73_m2} (ref >=60)
Globulin: 2 g/dL (calc) (ref 1.9–3.7)
Glucose, Bld: 80 mg/dL (ref 65–99)
Potassium: 4.6 mmol/L (ref 3.5–5.3)
Sodium: 138 mmol/L (ref 135–146)
Total Bilirubin: 0.5 mg/dL (ref 0.2–1.2)
Total Protein: 6.3 g/dL (ref 6.1–8.1)

## 2019-10-08 LAB — LIPID PANEL
Cholesterol: 141 mg/dL (ref <200)
HDL: 70 mg/dL (ref >=40)
LDL Cholesterol (Calc): 55 mg/dL (calc)
Non-HDL Cholesterol (Calc): 71 mg/dL (calc) (ref <130)
Total CHOL/HDL Ratio: 2 (calc) (ref <5.0)
Triglycerides: 77 mg/dL (ref <150)

## 2019-10-08 LAB — CBC WITH DIFFERENTIAL/PLATELET
Absolute Monocytes: 496 cells/uL (ref 200–950)
Basophils Absolute: 28 cells/uL (ref 0–200)
Basophils Relative: 0.7 %
Eosinophils Absolute: 40 cells/uL (ref 15–500)
Eosinophils Relative: 1 %
HCT: 44.2 % (ref 38.5–50.0)
Hemoglobin: 14.9 g/dL (ref 13.2–17.1)
Lymphs Abs: 564 cells/uL — ABNORMAL LOW (ref 850–3900)
MCH: 31.2 pg (ref 27.0–33.0)
MCHC: 33.7 g/dL (ref 32.0–36.0)
MCV: 92.7 fL (ref 80.0–100.0)
MPV: 12.6 fL — ABNORMAL HIGH (ref 7.5–12.5)
Monocytes Relative: 12.4 %
Neutro Abs: 2872 cells/uL (ref 1500–7800)
Neutrophils Relative %: 71.8 %
Platelets: 120 10*3/uL — ABNORMAL LOW (ref 140–400)
RBC: 4.77 10*6/uL (ref 4.20–5.80)
RDW: 13.4 % (ref 11.0–15.0)
Total Lymphocyte: 14.1 %
WBC: 4 10*3/uL (ref 3.8–10.8)

## 2019-10-29 ENCOUNTER — Other Ambulatory Visit: Payer: Self-pay | Admitting: Internal Medicine

## 2019-11-12 ENCOUNTER — Other Ambulatory Visit: Payer: Self-pay | Admitting: Internal Medicine

## 2019-11-12 DIAGNOSIS — N138 Other obstructive and reflux uropathy: Secondary | ICD-10-CM

## 2019-11-27 ENCOUNTER — Other Ambulatory Visit: Payer: Self-pay | Admitting: Internal Medicine

## 2019-11-27 DIAGNOSIS — I10 Essential (primary) hypertension: Secondary | ICD-10-CM

## 2019-12-04 ENCOUNTER — Other Ambulatory Visit: Payer: Self-pay | Admitting: Internal Medicine

## 2020-01-19 DIAGNOSIS — H401121 Primary open-angle glaucoma, left eye, mild stage: Secondary | ICD-10-CM | POA: Diagnosis not present

## 2020-01-26 ENCOUNTER — Encounter: Payer: Self-pay | Admitting: Internal Medicine

## 2020-01-26 NOTE — Patient Instructions (Signed)

## 2020-01-26 NOTE — Progress Notes (Signed)
Comprehensive Evaluation & Examination     This very nice 69 y.o. MWM  presents for a  comprehensive evaluation and management of multiple medical co-morbidities.  Patient has been followed for HTN, HLD, Prediabetes and Vitamin D Deficiency.     HTN predates since 5. Patient's BP has been controlled at home.  In 2018, head a heart cath with insignificant CAD.  Today's BP is at goal - 116/78. Patient denies any cardiac symptoms as chest pain, palpitations, shortness of breath, dizziness or ankle swelling.     Patient's hyperlipidemia is controlled with diet and Atorvastatin. Patient denies myalgias or other medication SE's. Last lipids were at goal:  Lab Results  Component Value Date   CHOL 141 10/07/2019   HDL 70 10/07/2019   LDLCALC 55 10/07/2019   TRIG 77 10/07/2019   CHOLHDL 2.0 10/07/2019       Patient has hx/o prediabetes (A1c 5.7% / 2012)   and patient denies reactive hypoglycemic symptoms, visual blurring, diabetic polys or paresthesias. Last A1c was Normal & at goal:  Lab Results  Component Value Date   HGBA1C 4.9 07/02/2019                                        Patient has been on parenteral Testosterone Replacement since May 2015  with improved stamina & sense of well being.     Finally, patient has history of Vitamin D Deficiency ("14" / 2008)  and last vitamin D was at goal:  Lab Results  Component Value Date   VD25OH 61 07/02/2019    Current Outpatient Medications on File Prior to Visit  Medication Sig  . Ascorbic Acid (VITAMIN C) 1000 MG tablet Take 1,000 mg by mouth daily.  Marland Kitchen aspirin 81 MG tablet Take 81 mg by mouth daily.  Marland Kitchen atenolol (TENORMIN) 25 MG tablet Take 1 tablet Daily for BP  . atorvastatin (LIPITOR) 20 MG tablet Take 1 tablet Daily for Cholesterol  . Cholecalciferol (VITAMIN D PO) Take 8,000 Units by mouth daily.   . citalopram (CELEXA) 40 MG tablet Take 1 tablet Daily for Mood  . finasteride (PROSCAR) 5 MG tablet Take 1 tablet Daily for  Prostate  . latanoprost (XALATAN) 0.005 % ophthalmic solution Place 1 drop into both eyes at bedtime.  Marland Kitchen losartan (COZAAR) 100 MG tablet TAKE 1 TABLET DAILY FOR BLOOD PRESSURE  . Multiple Vitamins-Minerals (MULTIVITAMIN PO) Take 1 tablet by mouth daily.   . tadalafil (CIALIS) 20 MG tablet TAKE 1/2 TO 1 TABLET BY MOUTH EVERY 2 TO 3 DAYS AS NEEDED FOR SEX  . tamsulosin (FLOMAX) 0.4 MG CAPS capsule TAKE 1 CAPSULE AT BEDTIME FOR PROSTATE  . testosterone cypionate (DEPOTESTOSTERONE CYPIONATE) 200 MG/ML injection INJECT 2 ML INTO MUSCLE EVERY 2 WEEKS  . timolol (BETIMOL) 0.5 % ophthalmic solution Place 1 drop into both eyes in the morning, at noon, and at bedtime.    No current facility-administered medications on file prior to visit.   Allergies  Allergen Reactions  . Ace Inhibitors     cough  . Other Itching    Synthetic opiods  Full Body itching   Past Medical History:  Diagnosis Date  . Anxiety   . Cervical stenosis of spine   . Depression   . GERD (gastroesophageal reflux disease)   . Glaucoma   . Gout   . Hyperlipidemia   . Hypertension   .  Hypogonadism male   . Prediabetes   . Vitamin D deficiency    Health Maintenance  Topic Date Due  . PNA vac Low Risk Adult (2 of 2 - PPSV23) 12/18/2019  . INFLUENZA VACCINE  02/29/2020  . Fecal DNA (Cologuard)  03/22/2021  . TETANUS/TDAP  08/19/2022  . COVID-19 Vaccine  Completed  . Hepatitis C Screening  Completed   Immunization History  Administered Date(s) Administered  . Influenza, High Dose Seasonal PF 09/02/2018  . Influenza-Unspecified 05/15/2019  . PFIZER SARS-COV-2 Vaccination 09/13/2019, 10/06/2019  . PPD Test 01/02/2014, 01/21/2015  . Pneumococcal Conjugate-13 12/18/2018  . Pneumococcal-Unspecified 08/23/1992  . Tdap 08/19/2012   03/22/2018  - Cologard - Negative - 3 year f/u due Aug/Sept 2022  Past Surgical History:  Procedure Laterality Date  . LEFT HEART CATH AND CORONARY ANGIOGRAPHY N/A 04/11/2017    Procedure: LEFT HEART CATH AND CORONARY ANGIOGRAPHY;  Surgeon: Lyn Records, MD;  Location: [facility];  Service: Cardiovascular;  Laterality: N/A;   Family History  Problem Relation Age of Onset  . Liver cancer Mother   . Heart disease Father   . Heart disease Brother   . Hypertension Brother   . Heart disease Paternal Grandmother   . Stroke Paternal Grandmother   . Stroke Paternal Grandfather   . Healthy Son    Social History   Socioeconomic History  . Marital status: Married    Spouse name: Vickie  . Number of children: 1 son & 1 daughter  Occupational History  . Retired   Tobacco Use  . Smoking status: Former Smoker    Packs/day: 1.50    Years: 25.00    Pack years: 82.50    Quit date: 07/31/1998    Years since quitting: 21.5  . Smokeless tobacco: Never Used  Substance and Sexual Activity  . Alcohol use: Yes    Alcohol/week: 7.0 standard drinks    Types: 7 Standard drinks or equivalent per week  . Drug use: No  . Sexual activity: Not on file     ROS Constitutional: Denies fever, chills, weight loss/gain, headaches, insomnia,  night sweats or change in appetite. Does c/o fatigue. Eyes: Denies redness, blurred vision, diplopia, discharge, itchy or watery eyes.  ENT: Denies discharge, congestion, post nasal drip, epistaxis, sore throat, earache, hearing loss, dental pain, Tinnitus, Vertigo, Sinus pain or snoring.  Cardio: Denies chest pain, palpitations, irregular heartbeat, syncope, dyspnea, diaphoresis, orthopnea, PND, claudication or edema Respiratory: denies cough, dyspnea, DOE, pleurisy, hoarseness, laryngitis or wheezing.  Gastrointestinal: Denies dysphagia, heartburn, reflux, water brash, pain, cramps, nausea, vomiting, bloating, diarrhea, constipation, hematemesis, melena, hematochezia, jaundice or hemorrhoids Genitourinary: Denies dysuria, frequency, urgency, nocturia, hesitancy, discharge, hematuria or flank pain Musculoskeletal: Denies arthralgia,  myalgia, stiffness, Jt. Swelling, pain, limp or strain/sprain. Denies Falls. Skin: Denies puritis, rash, hives, warts, acne, eczema or change in skin lesion Neuro: No weakness, tremor, incoordination, spasms, paresthesia or pain Psychiatric: Denies confusion, memory loss or sensory loss. Denies Depression. Endocrine: Denies change in weight, skin, hair change, nocturia, and paresthesia, diabetic polys, visual blurring or hyper / hypo glycemic episodes.  Heme/Lymph: No excessive bleeding, bruising or enlarged lymph nodes.  Physical Exam  BP 116/78   Pulse 60   Temp 97.8 F (36.6 C)   Resp 16   Ht 6\' 1"  (1.854 m)   Wt 208 lb 6.4 oz (94.5 kg)   BMI 27.50 kg/m   General Appearance: Well nourished and well groomed and in no apparent distress.  Eyes: PERRLA,  EOMs, conjunctiva no swelling or erythema, normal fundi and vessels. Sinuses: No frontal/maxillary tenderness ENT/Mouth: EACs patent / TMs  nl. Nares clear without erythema, swelling, mucoid exudates. Oral hygiene is good. No erythema, swelling, or exudate. Tongue normal, non-obstructing. Tonsils not swollen or erythematous. Hearing normal.  Neck: Supple, thyroid not palpable. No bruits, nodes or JVD. Respiratory: Respiratory effort normal.  BS equal and clear bilateral without rales, rhonci, wheezing or stridor. Cardio: Heart sounds are normal with regular rate and rhythm and no murmurs, rubs or gallops. Peripheral pulses are normal and equal bilaterally without edema. No aortic or femoral bruits. Chest: symmetric with normal excursions and percussion.  Abdomen: Soft, with Nl bowel sounds. Nontender, no guarding, rebound, hernias, masses, or organomegaly.  Lymphatics: Non tender without lymphadenopathy.  Musculoskeletal: Full ROM all peripheral extremities, joint stability, 5/5 strength, and normal gait. Skin: Warm and dry without rashes, lesions, cyanosis, clubbing or  ecchymosis.  Neuro: Cranial nerves intact, reflexes equal  bilaterally. Normal muscle tone, no cerebellar symptoms. Sensation intact.  Pysch: Alert and oriented X 3 with normal affect, insight and judgment appropriate.   Assessment and Plan  1. Essential hypertension  - EKG 12-Lead - [place], RETROPERITNL ABD,  LTD - Urinalysis, Routine w reflex microscopic - POC Hemoccult Bld/Stl (3-Cd Home Screen); Future - CBC with Differential/Platelet - COMPLETE METABOLIC PANEL WITH GFR - Magnesium  2. Hyperlipidemia, mixed  - EKG 12-Lead - [place], RETROPERITNL ABD,  LTD - Lipid panel - TSH  3. Abnormal glucose  - EKG 12-Lead - [place], RETROPERITNL ABD,  LTD - Hemoglobin A1c - Insulin, random  4. Vitamin D deficiency  - VITAMIN D 25 Hydroxy (Vit-D Deficiency, Fractures)  5. Idiopathic gout, unspecified chronicity, unspecified site  - Uric acid  6. Testosterone Deficiency  - Testosterone  7. BPH with obstruction/lower urinary tract symptoms  - PSA  8. Prostate cancer screening  - PSA  9. Screening for colorectal cancer  - POC Hemoccult Bld/Stl   10. Screening for ischemic heart disease  - EKG 12-Lead  11. FHx: heart disease  - EKG 12-Lead - [place], RETROPERITNL ABD,  LTD  12. Former smoker  - EKG 12-Lead - [place], RETROPERITNL ABD,  LTD  13. Screening for AAA (aortic abdominal aneurysm)  - [place], RETROPERITNL ABD,  LTD  14. Medication management  - Urinalysis, Routine w reflex microscopic - POC Hemoccult Bld/Stl  - Testosterone - CBC with Differential/Platelet - COMPLETE METABOLIC PANEL WITH GFR - Magnesium - Lipid panel - TSH - Hemoglobin A1c - Insulin, random - VITAMIN D 25 Hydroxyractures)          Patient was counseled in prudent diet, weight control to achieve/maintain BMI less than 25, BP monitoring, regular exercise and medications as discussed.  Discussed med effects and SE's. Routine screening labs and tests as requested with regular follow-up as recommended. Over 40 minutes of exam, counseling, chart review and high  complex critical decision making was performed   Marinus Maw, MD

## 2020-01-27 ENCOUNTER — Other Ambulatory Visit: Payer: Self-pay

## 2020-01-27 ENCOUNTER — Ambulatory Visit: Payer: Medicare Other | Admitting: Internal Medicine

## 2020-01-27 VITALS — BP 116/78 | HR 60 | Temp 97.8°F | Resp 16 | Ht 73.0 in | Wt 208.4 lb

## 2020-01-27 DIAGNOSIS — N401 Enlarged prostate with lower urinary tract symptoms: Secondary | ICD-10-CM | POA: Diagnosis not present

## 2020-01-27 DIAGNOSIS — Z136 Encounter for screening for cardiovascular disorders: Secondary | ICD-10-CM

## 2020-01-27 DIAGNOSIS — E291 Testicular hypofunction: Secondary | ICD-10-CM | POA: Diagnosis not present

## 2020-01-27 DIAGNOSIS — Z125 Encounter for screening for malignant neoplasm of prostate: Secondary | ICD-10-CM

## 2020-01-27 DIAGNOSIS — I10 Essential (primary) hypertension: Secondary | ICD-10-CM | POA: Diagnosis not present

## 2020-01-27 DIAGNOSIS — Z79899 Other long term (current) drug therapy: Secondary | ICD-10-CM

## 2020-01-27 DIAGNOSIS — E559 Vitamin D deficiency, unspecified: Secondary | ICD-10-CM | POA: Diagnosis not present

## 2020-01-27 DIAGNOSIS — E782 Mixed hyperlipidemia: Secondary | ICD-10-CM

## 2020-01-27 DIAGNOSIS — Z23 Encounter for immunization: Secondary | ICD-10-CM

## 2020-01-27 DIAGNOSIS — R7309 Other abnormal glucose: Secondary | ICD-10-CM | POA: Diagnosis not present

## 2020-01-27 DIAGNOSIS — M1000 Idiopathic gout, unspecified site: Secondary | ICD-10-CM | POA: Diagnosis not present

## 2020-01-27 DIAGNOSIS — N138 Other obstructive and reflux uropathy: Secondary | ICD-10-CM | POA: Diagnosis not present

## 2020-01-27 DIAGNOSIS — Z87891 Personal history of nicotine dependence: Secondary | ICD-10-CM

## 2020-01-27 DIAGNOSIS — Z1211 Encounter for screening for malignant neoplasm of colon: Secondary | ICD-10-CM

## 2020-01-27 DIAGNOSIS — Z8249 Family history of ischemic heart disease and other diseases of the circulatory system: Secondary | ICD-10-CM

## 2020-01-28 LAB — URINALYSIS, ROUTINE W REFLEX MICROSCOPIC
Bacteria, UA: NONE SEEN /HPF
Bilirubin Urine: NEGATIVE
Glucose, UA: NEGATIVE
Hgb urine dipstick: NEGATIVE
Hyaline Cast: NONE SEEN /LPF
Ketones, ur: NEGATIVE
Nitrite: NEGATIVE
Protein, ur: NEGATIVE
RBC / HPF: NONE SEEN /HPF (ref 0–2)
Specific Gravity, Urine: 1.018 (ref 1.001–1.03)
Squamous Epithelial / HPF: NONE SEEN /HPF (ref <=5)
WBC, UA: NONE SEEN /HPF (ref 0–5)
pH: 7.5 (ref 5.0–8.0)

## 2020-01-28 LAB — TESTOSTERONE: Testosterone: 1784 ng/dL — ABNORMAL HIGH (ref 250–827)

## 2020-01-28 LAB — MICROALBUMIN / CREATININE URINE RATIO
Creatinine, Urine: 144 mg/dL (ref 20–320)
Microalb, Ur: <0.2 mg/dL

## 2020-01-28 LAB — COMPLETE METABOLIC PANEL WITH GFR
AG Ratio: 2.3 (calc) (ref 1.0–2.5)
ALT: 29 U/L (ref 9–46)
AST: 24 U/L (ref 10–35)
Albumin: 4.5 g/dL (ref 3.6–5.1)
Alkaline phosphatase (APISO): 35 U/L (ref 35–144)
BUN: 11 mg/dL (ref 7–25)
CO2: 30 mmol/L (ref 20–32)
Calcium: 9.5 mg/dL (ref 8.6–10.3)
Chloride: 101 mmol/L (ref 98–110)
Creat: 0.97 mg/dL (ref 0.70–1.25)
GFR, Est African American: 94 mL/min/{1.73_m2} (ref >=60)
GFR, Est Non African American: 81 mL/min/{1.73_m2} (ref >=60)
Globulin: 2 g/dL (calc) (ref 1.9–3.7)
Glucose, Bld: 106 mg/dL — ABNORMAL HIGH (ref 65–99)
Potassium: 4.8 mmol/L (ref 3.5–5.3)
Sodium: 137 mmol/L (ref 135–146)
Total Bilirubin: 0.9 mg/dL (ref 0.2–1.2)
Total Protein: 6.5 g/dL (ref 6.1–8.1)

## 2020-01-28 LAB — CBC WITH DIFFERENTIAL/PLATELET
Absolute Monocytes: 563 cells/uL (ref 200–950)
Basophils Absolute: 50 cells/uL (ref 0–200)
Basophils Relative: 1.1 %
Eosinophils Absolute: 149 cells/uL (ref 15–500)
Eosinophils Relative: 3.3 %
HCT: 53.1 % — ABNORMAL HIGH (ref 38.5–50.0)
Hemoglobin: 18 g/dL — ABNORMAL HIGH (ref 13.2–17.1)
Lymphs Abs: 1413 cells/uL (ref 850–3900)
MCH: 31.7 pg (ref 27.0–33.0)
MCHC: 33.9 g/dL (ref 32.0–36.0)
MCV: 93.7 fL (ref 80.0–100.0)
MPV: 13.1 fL — ABNORMAL HIGH (ref 7.5–12.5)
Monocytes Relative: 12.5 %
Neutro Abs: 2327 cells/uL (ref 1500–7800)
Neutrophils Relative %: 51.7 %
Platelets: 137 10*3/uL — ABNORMAL LOW (ref 140–400)
RBC: 5.67 10*6/uL (ref 4.20–5.80)
RDW: 12.7 % (ref 11.0–15.0)
Total Lymphocyte: 31.4 %
WBC: 4.5 10*3/uL (ref 3.8–10.8)

## 2020-01-28 LAB — LIPID PANEL
Cholesterol: 142 mg/dL (ref <200)
HDL: 69 mg/dL (ref >=40)
LDL Cholesterol (Calc): 57 mg/dL (calc)
Non-HDL Cholesterol (Calc): 73 mg/dL (calc) (ref <130)
Total CHOL/HDL Ratio: 2.1 (calc) (ref <5.0)
Triglycerides: 84 mg/dL (ref <150)

## 2020-01-28 LAB — INSULIN, RANDOM: Insulin: 5.8 u[IU]/mL

## 2020-01-28 LAB — HEMOGLOBIN A1C
Hgb A1c MFr Bld: 4.9 % of total Hgb (ref <5.7)
Mean Plasma Glucose: 94 (calc)
eAG (mmol/L): 5.2 (calc)

## 2020-01-28 LAB — PSA: PSA: 0.7 ng/mL (ref <=4.0)

## 2020-01-28 LAB — URIC ACID: Uric Acid, Serum: 5.8 mg/dL (ref 4.0–8.0)

## 2020-01-28 LAB — VITAMIN D 25 HYDROXY (VIT D DEFICIENCY, FRACTURES): Vit D, 25-Hydroxy: 80 ng/mL (ref 30–100)

## 2020-01-28 LAB — MAGNESIUM: Magnesium: 2.2 mg/dL (ref 1.5–2.5)

## 2020-01-28 LAB — TSH: TSH: 1.86 mIU/L (ref 0.40–4.50)

## 2020-01-28 NOTE — Progress Notes (Signed)
=========================================================  -    PSA - Great  =========================================================  -  Testosterone elevated from recent shot  =========================================================  -  Uric Acid  / Gout test - OK  =========================================================  -  Total Chol =  142   and LDL Chol =  57  - Both Excellent   - Very low risk for Heart Attack  / Stroke ====================================================  -  A1c - Normal - Great - No Diabetes =========================================================  -  Vitamin D = 80 - Excellent  =========================================================  -  All Else - CBC - Kidneys - Electrolytes -  Liver - Magnesium & Thyroid  - all  Normal / OK =========================================================

## 2020-02-15 ENCOUNTER — Other Ambulatory Visit: Payer: Self-pay | Admitting: Internal Medicine

## 2020-02-15 DIAGNOSIS — N401 Enlarged prostate with lower urinary tract symptoms: Secondary | ICD-10-CM

## 2020-02-15 DIAGNOSIS — N138 Other obstructive and reflux uropathy: Secondary | ICD-10-CM

## 2020-02-15 MED ORDER — TAMSULOSIN HCL 0.4 MG PO CAPS: ORAL_CAPSULE | ORAL | 0 refills | Status: DC

## 2020-02-18 ENCOUNTER — Other Ambulatory Visit: Payer: Self-pay | Admitting: Internal Medicine

## 2020-03-14 ENCOUNTER — Other Ambulatory Visit: Payer: Self-pay | Admitting: Adult Health

## 2020-04-26 ENCOUNTER — Other Ambulatory Visit: Payer: Self-pay | Admitting: Internal Medicine

## 2020-04-28 ENCOUNTER — Ambulatory Visit: Payer: Medicare Other | Admitting: Adult Health

## 2020-04-30 DIAGNOSIS — Z23 Encounter for immunization: Secondary | ICD-10-CM | POA: Diagnosis not present

## 2020-05-05 ENCOUNTER — Other Ambulatory Visit: Payer: Self-pay | Admitting: Internal Medicine

## 2020-05-05 DIAGNOSIS — N138 Other obstructive and reflux uropathy: Secondary | ICD-10-CM

## 2020-05-05 DIAGNOSIS — N529 Male erectile dysfunction, unspecified: Secondary | ICD-10-CM | POA: Insufficient documentation

## 2020-05-05 DIAGNOSIS — N401 Enlarged prostate with lower urinary tract symptoms: Secondary | ICD-10-CM

## 2020-05-05 DIAGNOSIS — I700 Atherosclerosis of aorta: Secondary | ICD-10-CM | POA: Insufficient documentation

## 2020-05-05 DIAGNOSIS — E782 Mixed hyperlipidemia: Secondary | ICD-10-CM

## 2020-05-05 NOTE — Progress Notes (Signed)
MEDICARE ANNUAL WELLNESS VISIT AND FOLLOW UP Assessment:   Diagnoses and all orders for this visit:  Annual Medicare preventive visit  Atherosclerosis of aorta Control blood pressure, cholesterol, glucose, increase exercise.   CAD in native artery  Hasn't had chest pain since cath/workup by Dr. Rennis Golden Control blood pressure, cholesterol, glucose, increase exercise.  Continue ASA  Essential hypertension Continue medication Monitor blood pressure at home; call if consistently over 130/80 Continue DASH diet.   Reminder to go to the ER if any CP, SOB, nausea, dizziness, severe HA, changes vision/speech, left arm numbness and tingling and jaw pain.  Gastroesophageal reflux disease, esophagitis presence not specified Well managed on current medications Discussed diet, avoiding triggers and other lifestyle changes  Testosterone Deficiency - continue to monitor, states medication is helping with symptoms of low T.   Vitamin D deficiency At goal at recent check; continue to recommend supplementation for goal of 60-100 Defer vitamin D level  Other abnormal glucose Recent A1Cs at goal Discussed diet/exercise, weight management  Defer A1C; check CMP  Open-angle glaucoma of right eye, unspecified glaucoma stage, unspecified open-angle glaucoma type Continue drops, followed by ophthalmology  Mixed hyperlipidemia Continue medications, LDL goal <70 Continue low cholesterol diet and exercise.  Check lipid panel.   Medication management CBC, CMP/GFR  Idiopathic gout, unspecified chronicity, unspecified site Not on allopurinol, no recent flares, monitor, discuss lifestyle  Overweight (BMI 25.0-29.9) Long discussion about weight loss, diet, and exercise Recommended diet heavy in fruits and veggies and low in animal meats, cheeses, and dairy products, appropriate calorie intake Discussed appropriate weight for height  Follow up at next visit  Former smoker Normal lungs on CXR  2018, not candidate for CT screening Declines CXR today  Erectile dysfunction Cialis PRN working well   BPH  Continue finasteride, tamsulosin  Need for influenza vaccine High dose quadrivalent administered today without complication  Over 30 minutes of exam, counseling, chart review, and critical decision making was performed  Future Appointments  Date Time Provider Department Center  08/02/2020  2:30 PM Lucky Cowboy, MD [facility] None  01/27/2021 10:00 AM Lucky Cowboy, MD [facility] None     Plan:   During the course of the visit the patient was educated and counseled about appropriate screening and preventive services including:    Pneumococcal vaccine   Influenza vaccine  Prevnar 13  Td vaccine  Screening electrocardiogram  Colorectal cancer screening  Diabetes screening  Glaucoma screening  Nutrition counseling    Subjective:  Cory Carrillo is a 69 y.o. male who presents for Medicare Annual Wellness Visit and 3 month follow up for HTN, hyperlipidemia, glucose management, and vitamin D Def.   He has some chronic left hip/knee pain intermittently thought to be r/t lower back, has had injection in the past that worked well by Dr. Ethelene Hal at [facility]. He reports significantly improved in the interim. Pain is typical after long periods of standing. He declines interventions at this time. Takes advil  PRN, occasionally.   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   BMI is Body mass index is 27.44 kg/m., he has not been working on diet, walks 15 min twice a week without problems, does a lot of yardwork. He is trying to "eat better" - choosing better.  Wt Readings from Last 3 Encounters:  05/06/20 208 lb (94.3 kg)  01/27/20 208 lb 6.4 oz (94.5 kg)  10/07/19 215 lb (97.5 kg)   Patient had  a heart cath in Sept 2018 by Dr Rennis Golden after c/o [place] which demonstrated findings of minimal CAD, and was recommended medical  treatment.  He does have aortic atherosclerosis per CXR 2018 His blood pressure has been controlled at home, today their BP is BP: 124/72 He does not workout. He denies chest pain, shortness of breath, dizziness.   He is on cholesterol medication (atorvastatin 20 mg daily) and denies myalgias. His cholesterol is at goal. The cholesterol last visit was:   Lab Results  Component Value Date   CHOL 142 01/27/2020   HDL 69 01/27/2020   LDLCALC 57 01/27/2020   TRIG 84 01/27/2020   CHOLHDL 2.1 01/27/2020   He has not been working on diet and exercise for glucose management, and denies foot ulcerations, increased appetite, nausea, paresthesia of the feet, polydipsia, polyuria, visual disturbances, vomiting and weight loss. Last A1C in the office was:  Lab Results  Component Value Date   HGBA1C 4.9 01/27/2020   Last GFR Lab Results  Component Value Date   GFRNONAA 81 01/27/2020   Patient is on Vitamin D supplement.   Lab Results  Component Value Date   VD25OH 32 01/27/2020     He has a history of testosterone deficiency and is on testosterone replacement, taking 200 mg q2 weeks, last injection was 11 days ago.  He states that the testosterone helps with his energy, libido, muscle mass. Lab Results  Component Value Date   TESTOSTERONE 1,784 (H) 01/27/2020   He is also on cialis 20 mg PRN and feels this works well for him.   Patient has diagnosis of gout, hasn't been on a daily agent, but denies recent flares.  Lab Results  Component Value Date   LABURIC 5.8 01/27/2020   On finasteride and tamsulosin for BPH with nocturia  Lab Results  Component Value Date   PSA 0.7 01/27/2020   PSA 0.5 12/18/2018   PSA 0.7 03/13/2017     Medication Review:  Current Outpatient Medications (Endocrine & Metabolic):  .  testosterone cypionate (DEPOTESTOSTERONE CYPIONATE) 200 MG/ML injection, INJECT 2 ML INTO MUSCLE EVERY 2 WEEKS  Current Outpatient Medications (Cardiovascular):  .  atenolol  (TENORMIN) 25 MG tablet, Take 1 tablet Daily for BP .  atorvastatin (LIPITOR) 20 MG tablet, Take     1 tablet      Daily     for Cholesterol .  losartan (COZAAR) 50 MG tablet, TAKE 1 TABLET DAILY FOR BLOOD PRESSURE .  tadalafil (CIALIS) 20 MG tablet, Take     1/2 to 1 tablet       every 2  to3 days       if needed for XXXX   Current Outpatient Medications (Analgesics):  .  aspirin 81 MG tablet, Take 81 mg by mouth daily.   Current Outpatient Medications (Other):  Marland Kitchen  Ascorbic Acid (VITAMIN C) 1000 MG tablet, Take 1,000 mg by mouth daily. .  Cholecalciferol (VITAMIN D PO), Take 8,000 Units by mouth daily.  .  citalopram (CELEXA) 20 MG tablet, Take 1 tablet Daily for Mood .  finasteride (PROSCAR) 5 MG tablet, Take 1 tablet Daily for Prostate .  latanoprost (XALATAN) 0.005 % ophthalmic solution, Place 1 drop into both eyes at bedtime. .  Multiple Vitamins-Minerals (MULTIVITAMIN PO), Take 1 tablet by mouth daily.  .  tamsulosin (FLOMAX) 0.4 MG CAPS capsule, Take    2 capsules   at Bedtime      for Prostate .  timolol (BETIMOL) 0.5 %  ophthalmic solution, Place 1 drop into both eyes in the morning, at noon, and at bedtime.   Allergies: Allergies  Allergen Reactions  . Ace Inhibitors     cough  . Other Itching    Synthetic opiods  Full Body itching    Current Problems (verified) has Essential hypertension; Mixed hyperlipidemia; GERD ; Other abnormal glucose; Gout; Testosterone Deficiency; Vitamin D deficiency; Medication management; Open-angle glaucoma; CAD in native artery; Overweight (BMI 25.0-29.9); Former smoker; Erectile dysfunction; Aortic atherosclerosis (HCC); and Benign prostatic hyperplasia with nocturia on their problem list.  Screening Tests Immunization History  Administered Date(s) Administered  . Influenza, High Dose Seasonal PF 09/02/2018  . Influenza-Unspecified 05/15/2019  . PFIZER SARS-COV-2 Vaccination 09/13/2019, 10/06/2019  . PPD Test 01/02/2014, 01/21/2015  .  Pneumococcal Conjugate-13 12/18/2018  . Pneumococcal Polysaccharide-23 01/27/2020  . Pneumococcal-Unspecified 08/23/1992  . Tdap 08/19/2012   Preventative care: Last colonoscopy: had in 2004, declines further Cologuard: 02/2018, due 2022  CXR: 2018, declines today  Former smoker - 36 pack year history quit in 2000  Prior vaccinations: TD or Tdap: 2014  Influenza: 05/2019, TODAY  Pneumococcal: 1994, 2021 Prevnar13: 11/2018 Shingles/Zostavax: declines, reports had zostavax Covid 19: 2/2, 2021, had 3rd shot  Names of Other Physician/Practitioners you currently use: 1. [facility] here for primary care 2. [facility], Dr. Lorin PicketScott, eye doctor, last visit 2021, goes 3-4 times a year, monitoring glaucoma/IOP 3. Dr. Dimas Millinajadimi, dentist, last visit 2021, [id number]  Patient Care Team: Lucky CowboyMcKeown, William, MD as PCP - General (Internal Medicine) Sharrell KuMedoff, Jeffrey, MD as Consulting Physician (Gastroenterology)  Surgical: He  has a past surgical history that includes LEFT HEART CATH AND CORONARY ANGIOGRAPHY (N/A, 04/11/2017). Family His family history includes Healthy in his son; Heart disease in his brother, father, and paternal grandmother; Hypertension in his brother; Liver cancer in his mother; Stroke in his paternal grandfather and paternal grandmother. Social history  He reports that he quit smoking about 21 years ago. He has a 37.50 pack-year smoking history. He has never used smokeless tobacco. He reports current alcohol use of about 7.0 standard drinks of alcohol per week. He reports that he does not use drugs.  MEDICARE WELLNESS OBJECTIVES: Physical activity: Current Exercise Habits: Home exercise routine, Type of exercise: walking, Time (Minutes): 15, Frequency (Times/Week): 2, Weekly Exercise (Minutes/Week): 30, Intensity: Mild, Exercise limited by: orthopedic condition(s) Cardiac risk factors: Cardiac Risk Factors include: advanced age (>7555men, 13>65  women);dyslipidemia;hypertension;sedentary lifestyle;smoking/ tobacco exposure Depression/mood screen:   [facility]/06/2020  Decreased Interest 0  Down, Depressed, Hopeless 0  PHQ - 2 Score 0  Altered sleeping 0  Tired, decreased energy 0  Change in appetite 0  Feeling bad or failure about yourself  0  Trouble concentrating 0  Moving slowly or fidgety/restless 0  Suicidal thoughts 0  PHQ-9 Score 0  Difficult doing work/chores Not difficult at all    ADLs:  In your present state of health, do you have any difficulty performing the following activities: 05/06/2020 01/26/2020  Hearing? N N  Vision? N N  Difficulty concentrating or making decisions? N N  Walking or climbing stairs? N N  Dressing or bathing? N N  Doing errands, shopping? N N  Some recent data might be hidden     Cognitive Testing  Alert? Yes  Normal Appearance?Yes  Oriented to person? Yes  Place? Yes   Time? Yes  Recall of three objects?  Yes  Can perform simple calculations? Yes  Displays appropriate  judgment?Yes  Can read the correct time from a watch face?Yes  EOL planning: Does Patient Have a Medical Advance Directive?: No Would patient like information on creating a medical advance directive?: No - Patient declined   Objective:   Today's Vitals   05/06/20 1451  BP: 124/72  Pulse: (!) 57  Temp: 97.7 F (36.5 C)  SpO2: 97%  Weight: 208 lb (94.3 kg)   Body mass index is 27.44 kg/m.  General appearance: alert, no distress, WD/WN, male HEENT: normocephalic, sclerae anicteric, TMs pearly, nares patent, no discharge or erythema, pharynx normal Oral cavity: MMM, no lesions Neck: supple, no lymphadenopathy, no thyromegaly, no masses Heart: RRR, normal S1, S2, no murmurs Lungs: CTA bilaterally, no wheezes, rhonchi, or rales Abdomen: +bs, soft, non tender, non distended, no masses, no hepatomegaly, no splenomegaly Musculoskeletal: nontender, no swelling, no obvious  deformity Extremities: no edema, no cyanosis, no clubbing Pulses: 2+ symmetric, upper and lower extremities, normal cap refill Neurological: alert, oriented x 3, CN2-12 intact, strength normal upper extremities and lower extremities, sensation normal throughout, DTRs 2+ throughout, no cerebellar signs, gait normal Psychiatric: normal affect, behavior normal, pleasant   Medicare Attestation I have personally reviewed: The patient's medical and social history Their use of alcohol, tobacco or illicit drugs Their current medications and supplements The patient's functional ability including ADLs,fall risks, home safety risks, cognitive, and hearing and visual impairment Diet and physical activities Evidence for depression or mood disorders  The patient's weight, height, BMI, and visual acuity have been recorded in the chart.  I have made referrals, counseling, and provided education to the patient based on review of the above and I have provided the patient with a written personalized care plan for preventive services.     Dan Maker, NP   05/06/2020

## 2020-05-06 ENCOUNTER — Other Ambulatory Visit: Payer: Self-pay

## 2020-05-06 ENCOUNTER — Encounter: Payer: Self-pay | Admitting: Adult Health

## 2020-05-06 ENCOUNTER — Ambulatory Visit (INDEPENDENT_AMBULATORY_CARE_PROVIDER_SITE_OTHER): Payer: Medicare Other | Admitting: Adult Health

## 2020-05-06 VITALS — BP 124/72 | HR 57 | Temp 97.7°F | Wt 208.0 lb

## 2020-05-06 DIAGNOSIS — K219 Gastro-esophageal reflux disease without esophagitis: Secondary | ICD-10-CM

## 2020-05-06 DIAGNOSIS — N401 Enlarged prostate with lower urinary tract symptoms: Secondary | ICD-10-CM | POA: Insufficient documentation

## 2020-05-06 DIAGNOSIS — I10 Essential (primary) hypertension: Secondary | ICD-10-CM

## 2020-05-06 DIAGNOSIS — Z23 Encounter for immunization: Secondary | ICD-10-CM

## 2020-05-06 DIAGNOSIS — I700 Atherosclerosis of aorta: Secondary | ICD-10-CM | POA: Diagnosis not present

## 2020-05-06 DIAGNOSIS — E782 Mixed hyperlipidemia: Secondary | ICD-10-CM | POA: Diagnosis not present

## 2020-05-06 DIAGNOSIS — N529 Male erectile dysfunction, unspecified: Secondary | ICD-10-CM

## 2020-05-06 DIAGNOSIS — H4010X0 Unspecified open-angle glaucoma, stage unspecified: Secondary | ICD-10-CM

## 2020-05-06 DIAGNOSIS — M1000 Idiopathic gout, unspecified site: Secondary | ICD-10-CM

## 2020-05-06 DIAGNOSIS — I2510 Atherosclerotic heart disease of native coronary artery without angina pectoris: Secondary | ICD-10-CM

## 2020-05-06 DIAGNOSIS — Z79899 Other long term (current) drug therapy: Secondary | ICD-10-CM | POA: Diagnosis not present

## 2020-05-06 DIAGNOSIS — R6889 Other general symptoms and signs: Secondary | ICD-10-CM

## 2020-05-06 DIAGNOSIS — Z87891 Personal history of nicotine dependence: Secondary | ICD-10-CM

## 2020-05-06 DIAGNOSIS — I200 Unstable angina: Secondary | ICD-10-CM | POA: Diagnosis not present

## 2020-05-06 DIAGNOSIS — R7309 Other abnormal glucose: Secondary | ICD-10-CM | POA: Diagnosis not present

## 2020-05-06 DIAGNOSIS — E559 Vitamin D deficiency, unspecified: Secondary | ICD-10-CM

## 2020-05-06 DIAGNOSIS — E291 Testicular hypofunction: Secondary | ICD-10-CM

## 2020-05-06 DIAGNOSIS — Z0000 Encounter for general adult medical examination without abnormal findings: Secondary | ICD-10-CM

## 2020-05-06 DIAGNOSIS — E663 Overweight: Secondary | ICD-10-CM

## 2020-05-06 DIAGNOSIS — Z0001 Encounter for general adult medical examination with abnormal findings: Secondary | ICD-10-CM | POA: Diagnosis not present

## 2020-05-06 DIAGNOSIS — R351 Nocturia: Secondary | ICD-10-CM | POA: Insufficient documentation

## 2020-05-06 DIAGNOSIS — F325 Major depressive disorder, single episode, in full remission: Secondary | ICD-10-CM

## 2020-05-06 MED ORDER — LOSARTAN POTASSIUM 50 MG PO TABS: ORAL_TABLET | ORAL | 3 refills | Status: DC

## 2020-05-06 MED ORDER — CITALOPRAM HYDROBROMIDE 20 MG PO TABS: ORAL_TABLET | ORAL | 3 refills | Status: DC

## 2020-05-06 NOTE — Patient Instructions (Addendum)
  Cory Carrillo , Thank you for taking time to come for your Medicare Wellness Visit. I appreciate your ongoing commitment to your health goals. Please review the following plan we discussed and let me know if I can assist you in the future.   These are the goals we discussed: Goals    . Blood Pressure < 130/80    . Exercise 150 min/wk Moderate Activity    . LDL CALC < 100       This is a list of the screening recommended for you and due dates:  Health Maintenance  Topic Date Due  . Flu Shot  02/29/2020  . Cologuard (Stool DNA test)  03/22/2021  . Tetanus Vaccine  08/19/2022  . COVID-19 Vaccine  Completed  .  Hepatitis C: One time screening is recommended by Center for Disease Control  (CDC) for  adults born from [birth year] through 1963.   Completed  . Pneumonia vaccines  Completed

## 2020-05-06 NOTE — Addendum Note (Signed)
Addended by: Dionicio Stall on: 05/06/2020 03:26 PM   Modules accepted: Orders

## 2020-05-07 LAB — LIPID PANEL
Cholesterol: 147 mg/dL (ref <200)
HDL: 69 mg/dL (ref >=40)
LDL Cholesterol (Calc): 54 mg/dL (calc)
Non-HDL Cholesterol (Calc): 78 mg/dL (calc) (ref <130)
Total CHOL/HDL Ratio: 2.1 (calc) (ref <5.0)
Triglycerides: 162 mg/dL — ABNORMAL HIGH (ref <150)

## 2020-05-07 LAB — CBC WITH DIFFERENTIAL/PLATELET
Absolute Monocytes: 697 cells/uL (ref 200–950)
Basophils Absolute: 59 cells/uL (ref 0–200)
Basophils Relative: 1.1 %
Eosinophils Absolute: 92 cells/uL (ref 15–500)
Eosinophils Relative: 1.7 %
HCT: 50.5 % — ABNORMAL HIGH (ref 38.5–50.0)
Hemoglobin: 17.3 g/dL — ABNORMAL HIGH (ref 13.2–17.1)
Lymphs Abs: 1609 cells/uL (ref 850–3900)
MCH: 31.9 pg (ref 27.0–33.0)
MCHC: 34.3 g/dL (ref 32.0–36.0)
MCV: 93.2 fL (ref 80.0–100.0)
MPV: 12.4 fL (ref 7.5–12.5)
Monocytes Relative: 12.9 %
Neutro Abs: 2943 cells/uL (ref 1500–7800)
Neutrophils Relative %: 54.5 %
Platelets: 141 10*3/uL (ref 140–400)
RBC: 5.42 10*6/uL (ref 4.20–5.80)
RDW: 13.9 % (ref 11.0–15.0)
Total Lymphocyte: 29.8 %
WBC: 5.4 10*3/uL (ref 3.8–10.8)

## 2020-05-07 LAB — COMPLETE METABOLIC PANEL WITH GFR
AG Ratio: 2.4 (calc) (ref 1.0–2.5)
ALT: 23 U/L (ref 9–46)
AST: 20 U/L (ref 10–35)
Albumin: 4.6 g/dL (ref 3.6–5.1)
Alkaline phosphatase (APISO): 41 U/L (ref 35–144)
BUN: 9 mg/dL (ref 7–25)
CO2: 31 mmol/L (ref 20–32)
Calcium: 9.7 mg/dL (ref 8.6–10.3)
Chloride: 101 mmol/L (ref 98–110)
Creat: 0.87 mg/dL (ref 0.70–1.25)
GFR, Est African American: 104 mL/min/{1.73_m2} (ref >=60)
GFR, Est Non African American: 89 mL/min/{1.73_m2} (ref >=60)
Globulin: 1.9 g/dL (calc) (ref 1.9–3.7)
Glucose, Bld: 79 mg/dL (ref 65–99)
Potassium: 4.4 mmol/L (ref 3.5–5.3)
Sodium: 140 mmol/L (ref 135–146)
Total Bilirubin: 0.7 mg/dL (ref 0.2–1.2)
Total Protein: 6.5 g/dL (ref 6.1–8.1)

## 2020-05-07 LAB — MAGNESIUM: Magnesium: 2.1 mg/dL (ref 1.5–2.5)

## 2020-05-07 LAB — TSH: TSH: 1.4 mIU/L (ref 0.40–4.50)

## 2020-05-19 DIAGNOSIS — H401121 Primary open-angle glaucoma, left eye, mild stage: Secondary | ICD-10-CM | POA: Diagnosis not present

## 2020-06-06 ENCOUNTER — Other Ambulatory Visit: Payer: Self-pay | Admitting: Internal Medicine

## 2020-07-12 ENCOUNTER — Other Ambulatory Visit: Payer: Self-pay | Admitting: Internal Medicine

## 2020-08-01 ENCOUNTER — Other Ambulatory Visit: Payer: Self-pay | Admitting: Internal Medicine

## 2020-08-01 DIAGNOSIS — E782 Mixed hyperlipidemia: Secondary | ICD-10-CM

## 2020-08-02 ENCOUNTER — Ambulatory Visit: Payer: Medicare Other | Admitting: Internal Medicine

## 2020-08-09 ENCOUNTER — Encounter: Payer: Self-pay | Admitting: Internal Medicine

## 2020-08-09 NOTE — Patient Instructions (Signed)

## 2020-08-09 NOTE — Progress Notes (Signed)
History of Present Illness:       This very nice 70 y.o. MWM  presents for 6 month follow up with HTN, HLD, Pre-Diabetes and Vitamin D Deficiency. Patient has prior hx/o Gout currently quiescent off meds.       Patient is treated for HTN  (1994) & BP has been controlled at home. Today's BP is at goal - 116/74.   Heart cath in 2018 found insignificant CAD. Patient has had no complaints of any cardiac type chest pain, palpitations, dyspnea / orthopnea / PND, dizziness, claudication, or dependent edema.      Hyperlipidemia is controlled with diet & meds. Patient denies myalgias or other med SE's. Last Lipids were at goal:  Lab Results  Component Value Date   CHOL 147 05/06/2020   HDL 69 05/06/2020   LDLCALC 54 05/06/2020   TRIG 162 (H) 05/06/2020   CHOLHDL 2.1 05/06/2020                                        Patient has hx/o Testosterone Deficiency & is on parenteral replacement with improved stamina & sense of well being.    Also, the patient has history of PreDiabetes   (A1c 5.7% /2012) and has had no symptoms of reactive hypoglycemia, diabetic polys, paresthesias or visual blurring.  Last A1c was normal & at goal:  Lab Results  Component Value Date   HGBA1C 4.9 01/27/2020           Further, the patient also has history of Vitamin D Deficiency and supplements vitamin D without any suspected side-effects. Last vitamin D was at goal:  Lab Results  Component Value Date   VD25OH 9 01/27/2020    Current Outpatient Medications on File Prior to Visit  Medication Sig  . VITAMIN C 1000 MG  Take daily.  Marland Kitchen aspirin 81 MG  Take  daily.  Marland Kitchen atenolol  25 MG  TAKE 1 TABLET DAILY   . atorvastatin 20 MG  Take     1 tablet      Daily    . VITAMIN D  Take 8,000 Units daily.   . citalopram  20 MG t Take 1 tablet Daily   . finasteride  5 MG TAKE 1 TABLET DAILY  . XALATAN 0.005 % ophth soln Place 1 drop into both eyes at bedtime.  Marland Kitchen losartan  50 MG  TAKE 1 TABLET DAILY   . Multiple  Vitamins-Minerals  Take 1 tablet  daily.   . tadalafil  20 MG  Take 1/2 to 1 tab every 2 to 3 days as needed   . tamsulosin 0.4 MG  Take    2 capsules   at Bedtime      for Prostate  . testosterone cypio 200 MG/ML injec INJECT 2 ML IM EVERY 2 WKS  . timolol (BETIMOL) 0.5 % ophth soln Place 1 drop into both eyes 3 x/day     Allergies  Allergen Reactions  . Ace Inhibitors cough  . Synthetic opiods Itching    PMHx:   Past Medical History:  Diagnosis Date  . Anxiety   . Cervical stenosis of spine   . Depression   . GERD (gastroesophageal reflux disease)   . Glaucoma   . Gout   . Hyperlipidemia   . Hypertension   . Hypogonadism male   . Prediabetes   .  Vitamin D deficiency     Immunization History  Administered Date(s) Administered  . Influenza, High Dose Seasonal PF 09/02/2018, 05/06/2020  . Influenza-Unspecified 05/15/2019  . PFIZER SARS-COV-2 Vaccination 09/13/2019, 10/06/2019, 05/09/2020  . PPD Test 01/02/2014, 01/21/2015  . Pneumococcal Conjugate-13 12/18/2018  . Pneumococcal Polysaccharide-23 01/27/2020  . Pneumococcal-Unspecified 08/23/1992  . Tdap 08/19/2012    Past Surgical History:  Procedure Laterality Date  . LEFT HEART CATH AND CORONARY ANGIOGRAPHY N/A 04/11/2017   Procedure: LEFT HEART CATH AND CORONARY ANGIOGRAPHY;  Surgeon: Lyn Records, MD;  Location: [facility];  Service: Cardiovascular;  Laterality: N/A;    FHx:    Reviewed / unchanged  SHx:    Reviewed / unchanged   Systems Review:  Constitutional: Denies fever, chills, wt changes, headaches, insomnia, fatigue, night sweats, change in appetite. Eyes: Denies redness, blurred vision, diplopia, discharge, itchy, watery eyes.  ENT: Denies discharge, congestion, post nasal drip, epistaxis, sore throat, earache, hearing loss, dental pain, tinnitus, vertigo, sinus pain, snoring.  CV: Denies chest pain, palpitations, irregular heartbeat, syncope, dyspnea, diaphoresis, orthopnea, PND,  claudication or edema. Respiratory: denies cough, dyspnea, DOE, pleurisy, hoarseness, laryngitis, wheezing.  Gastrointestinal: Denies dysphagia, odynophagia, heartburn, reflux, water brash, abdominal pain or cramps, nausea, vomiting, bloating, diarrhea, constipation, hematemesis, melena, hematochezia  or hemorrhoids. Genitourinary: Denies dysuria, frequency, urgency, nocturia, hesitancy, discharge, hematuria or flank pain. Musculoskeletal: Denies arthralgias, myalgias, stiffness, jt. swelling, pain, limping or strain/sprain.  Skin: Denies pruritus, rash, hives, warts, acne, eczema or change in skin lesion(s). Neuro: No weakness, tremor, incoordination, spasms, paresthesia or pain. Psychiatric: Denies confusion, memory loss or sensory loss. Endo: Denies change in weight, skin or hair change.  Heme/Lymph: No excessive bleeding, bruising or enlarged lymph nodes.  Physical Exam  BP 116/74   Pulse (!) 58   Temp 97.6 F (36.4 C)   Resp 16   Ht 6\' 1"  (1.854 m)   Wt 207 lb 9.6 oz (94.2 kg)   SpO2 97%   BMI 27.39 kg/m   Appears  well nourished, well groomed  and in no distress.  Eyes: PERRLA, EOMs, conjunctiva no swelling or erythema. Sinuses: No frontal/maxillary tenderness ENT/Mouth: EAC's clear, TM's nl w/o erythema, bulging. Nares clear w/o erythema, swelling, exudates. Oropharynx clear without erythema or exudates. Oral hygiene is good. Tongue normal, non obstructing. Hearing intact.  Neck: Supple. Thyroid not palpable. Car 2+/2+ without bruits, nodes or JVD. Chest: Respirations nl with BS clear & equal w/o rales, rhonchi, wheezing or stridor.  Cor: Heart sounds normal w/ regular rate and rhythm without sig. murmurs, gallops, clicks or rubs. Peripheral pulses normal and equal  without edema.  Abdomen: Soft & bowel sounds normal. Non-tender w/o guarding, rebound, hernias, masses or organomegaly.  Lymphatics: Unremarkable.  Musculoskeletal: Full ROM all peripheral extremities, joint  stability, 5/5 strength and normal gait.  Skin: Warm, dry without exposed rashes, lesions or ecchymosis apparent.  Neuro: Cranial nerves intact, reflexes equal bilaterally. Sensory-motor testing grossly intact. Tendon reflexes grossly intact.  Pysch: Alert & oriented x 3.  Insight and judgement nl & appropriate. No ideations.  Assessment and Plan:  1. Essential hypertension  - Continue medication, monitor blood pressure at home.  - Continue DASH diet.  Reminder to go to the ER if any CP,  SOB, nausea, dizziness, severe HA, changes vision/speech.  - CBC with Differential/Platelet - COMPLETE METABOLIC PANEL WITH GFR - Magnesium - TSH  2. Hyperlipidemia, mixed  - Continue diet/meds, exercise,& lifestyle modifications.  - Continue monitor periodic cholesterol/liver &  renal functions   - Lipid panel - TSH  3. Abnormal glucose  - Continue diet, exercise  - Lifestyle modifications.  - Monitor appropriate labs.  - Hemoglobin A1c - Insulin, random  4. Vitamin D deficiency  - Continue supplementation.  - VITAMIN D 25 Hydroxy  5. Testosterone Deficiency  - Testosterone  6. Aortic atherosclerosis (HCC) by CXR in 2018  - Lipid panel  7. Idiopathic gout  - Uric acid  8. Medication management  - CBC with Differential/Platelet - COMPLETE METABOLIC PANEL WITH GFR - Magnesium - Lipid panel - TSH - Hemoglobin A1c - Insulin, random - VITAMIN D 25 Hydroxy - Testosterone - Uric acid       Discussed  regular exercise, BP monitoring, weight control to achieve/maintain BMI less than 25 and discussed med and SE's. Recommended labs to assess and monitor clinical status with further disposition pending results of labs.  I discussed the assessment and treatment plan with the patient. The patient was provided an opportunity to ask questions and all were answered. The patient agreed with the plan and demonstrated an understanding of the instructions.  I provided over 30 minutes of  exam, counseling, chart review and  complex critical decision making.       The patient was advised to call back or seek an in-person evaluation if the symptoms worsen or if the condition fails to improve as anticipated.   Marinus Maw, MD

## 2020-08-10 ENCOUNTER — Other Ambulatory Visit: Payer: Self-pay

## 2020-08-10 ENCOUNTER — Ambulatory Visit (INDEPENDENT_AMBULATORY_CARE_PROVIDER_SITE_OTHER): Payer: Medicare Other | Admitting: Internal Medicine

## 2020-08-10 VITALS — BP 116/74 | HR 58 | Temp 97.6°F | Resp 16 | Ht 73.0 in | Wt 207.6 lb

## 2020-08-10 DIAGNOSIS — I700 Atherosclerosis of aorta: Secondary | ICD-10-CM

## 2020-08-10 DIAGNOSIS — R7309 Other abnormal glucose: Secondary | ICD-10-CM

## 2020-08-10 DIAGNOSIS — I10 Essential (primary) hypertension: Secondary | ICD-10-CM | POA: Diagnosis not present

## 2020-08-10 DIAGNOSIS — E782 Mixed hyperlipidemia: Secondary | ICD-10-CM | POA: Diagnosis not present

## 2020-08-10 DIAGNOSIS — E559 Vitamin D deficiency, unspecified: Secondary | ICD-10-CM

## 2020-08-10 DIAGNOSIS — Z79899 Other long term (current) drug therapy: Secondary | ICD-10-CM

## 2020-08-10 DIAGNOSIS — M1000 Idiopathic gout, unspecified site: Secondary | ICD-10-CM | POA: Diagnosis not present

## 2020-08-10 DIAGNOSIS — E291 Testicular hypofunction: Secondary | ICD-10-CM

## 2020-08-11 LAB — CBC WITH DIFFERENTIAL/PLATELET
Absolute Monocytes: 638 cells/uL (ref 200–950)
Basophils Absolute: 58 cells/uL (ref 0–200)
Basophils Relative: 1 %
Eosinophils Absolute: 81 cells/uL (ref 15–500)
Eosinophils Relative: 1.4 %
HCT: 54 % — ABNORMAL HIGH (ref 38.5–50.0)
Hemoglobin: 18.4 g/dL — ABNORMAL HIGH (ref 13.2–17.1)
Lymphs Abs: 1485 cells/uL (ref 850–3900)
MCH: 31.3 pg (ref 27.0–33.0)
MCHC: 34.1 g/dL (ref 32.0–36.0)
MCV: 92 fL (ref 80.0–100.0)
MPV: 13.5 fL — ABNORMAL HIGH (ref 7.5–12.5)
Monocytes Relative: 11 %
Neutro Abs: 3538 cells/uL (ref 1500–7800)
Neutrophils Relative %: 61 %
Platelets: 136 10*3/uL — ABNORMAL LOW (ref 140–400)
RBC: 5.87 10*6/uL — ABNORMAL HIGH (ref 4.20–5.80)
RDW: 13.1 % (ref 11.0–15.0)
Total Lymphocyte: 25.6 %
WBC: 5.8 10*3/uL (ref 3.8–10.8)

## 2020-08-11 LAB — COMPLETE METABOLIC PANEL WITH GFR
AG Ratio: 2.4 (calc) (ref 1.0–2.5)
ALT: 33 U/L (ref 9–46)
AST: 28 U/L (ref 10–35)
Albumin: 4.6 g/dL (ref 3.6–5.1)
Alkaline phosphatase (APISO): 42 U/L (ref 35–144)
BUN: 11 mg/dL (ref 7–25)
CO2: 31 mmol/L (ref 20–32)
Calcium: 9.4 mg/dL (ref 8.6–10.3)
Chloride: 101 mmol/L (ref 98–110)
Creat: 0.97 mg/dL (ref 0.70–1.25)
GFR, Est African American: 93 mL/min/{1.73_m2} (ref >=60)
GFR, Est Non African American: 80 mL/min/{1.73_m2} (ref >=60)
Globulin: 1.9 g/dL (calc) (ref 1.9–3.7)
Glucose, Bld: 70 mg/dL (ref 65–99)
Potassium: 4.7 mmol/L (ref 3.5–5.3)
Sodium: 137 mmol/L (ref 135–146)
Total Bilirubin: 0.9 mg/dL (ref 0.2–1.2)
Total Protein: 6.5 g/dL (ref 6.1–8.1)

## 2020-08-11 LAB — LIPID PANEL
Cholesterol: 139 mg/dL (ref <200)
HDL: 66 mg/dL (ref >=40)
LDL Cholesterol (Calc): 44 mg/dL (calc)
Non-HDL Cholesterol (Calc): 73 mg/dL (calc) (ref <130)
Total CHOL/HDL Ratio: 2.1 (calc) (ref <5.0)
Triglycerides: 232 mg/dL — ABNORMAL HIGH (ref <150)

## 2020-08-11 LAB — HEMOGLOBIN A1C
Hgb A1c MFr Bld: 5 % of total Hgb (ref <5.7)
Mean Plasma Glucose: 97 mg/dL
eAG (mmol/L): 5.4 mmol/L

## 2020-08-11 LAB — URIC ACID: Uric Acid, Serum: 6.2 mg/dL (ref 4.0–8.0)

## 2020-08-11 LAB — TSH: TSH: 1.64 mIU/L (ref 0.40–4.50)

## 2020-08-11 LAB — INSULIN, RANDOM: Insulin: 11 u[IU]/mL

## 2020-08-11 LAB — MAGNESIUM: Magnesium: 2.2 mg/dL (ref 1.5–2.5)

## 2020-08-11 LAB — TESTOSTERONE: Testosterone: 1374 ng/dL — ABNORMAL HIGH (ref 250–827)

## 2020-08-11 LAB — VITAMIN D 25 HYDROXY (VIT D DEFICIENCY, FRACTURES): Vit D, 25-Hydroxy: 64 ng/mL (ref 30–100)

## 2020-08-11 NOTE — Progress Notes (Signed)
========================================================== -   Test results slightly outside the reference range are not unusual. If there is anything important, I will review this with you,  otherwise it is considered normal test values.  If you have further questions,  please do not hesitate to contact me at the office or via My Chart.  ========================================================== ==========================================================  -  Total Chol = 139 and LDL Chol = 44 - Both  Excellent   - Very low risk for Heart Attack  / Stroke ========================================================  -  But Triglycerides (   232   ) or fats in blood are too high  (goal is less than 150)    - Recommend avoid fried & greasy foods,  sweets / candy,   - Avoid white rice  (brown or wild rice or Quinoa is OK),   - Avoid white potatoes  (sweet potatoes are OK)   - Avoid anything made from white flour  - bagels, doughnuts, rolls, buns, biscuits, white and   wheat breads, pizza crust and traditional  pasta made of white flour & egg white  - (vegetarian pasta or spinach or wheat pasta is OK).    - Multi-grain bread is OK - like multi-grain flat bread or  sandwich thins.   - Avoid alcohol in excess.   - Exercise is also important. ==========================================================  -  A1c - Normal - Great  -  No Diabetes !  ==========================================================  -  Vitamin D = 64 - Great  ==========================================================  -  Testosterone level very high , So recommend cut back on the dose to   -  1 & 1/2 ml every 2 weeks    or   -  1 ml every week  ==========================================================  -  Uric Acid / Gout test - OK  ==========================================================  -  All Else - CBC - Kidneys - Electrolytes - Liver - Magnesium & Thyroid    - all  Normal /  OK ===========================================================  ===========================================================

## 2020-09-21 DIAGNOSIS — H401131 Primary open-angle glaucoma, bilateral, mild stage: Secondary | ICD-10-CM | POA: Diagnosis not present

## 2020-10-24 ENCOUNTER — Other Ambulatory Visit: Payer: Self-pay | Admitting: Internal Medicine

## 2020-10-24 DIAGNOSIS — E782 Mixed hyperlipidemia: Secondary | ICD-10-CM

## 2020-11-03 ENCOUNTER — Ambulatory Visit: Payer: Medicare Other | Attending: Internal Medicine

## 2020-11-03 ENCOUNTER — Other Ambulatory Visit: Payer: Self-pay

## 2020-11-03 DIAGNOSIS — Z23 Encounter for immunization: Secondary | ICD-10-CM

## 2020-11-03 NOTE — Progress Notes (Signed)
   Covid-19 Vaccination Clinic  Name:  KORREY SCHLEICHER    MRN: [id number] DOB: 02-22-1951  11/03/2020  Mr. Raby was observed post Covid-19 immunization for 15 minutes without incident. He was provided with Vaccine Information Sheet and instruction to access the V-Safe system.   Mr. Phillis was instructed to call 911 with any severe reactions post vaccine: Marland Kitchen Difficulty breathing  . Swelling of face and throat  . A fast heartbeat  . A bad rash all over body  . Dizziness and weakness   Immunizations Administered    Name Date Dose VIS Date Route   PFIZER Comrnaty(Gray TOP) Covid-19 Vaccine 11/03/2020  2:26 PM 0.3 mL 07/08/2020 Intramuscular   Manufacturer: [employer], [employer]   Lot: [id number]   NDC: [phone]

## 2020-11-11 ENCOUNTER — Other Ambulatory Visit (HOSPITAL_BASED_OUTPATIENT_CLINIC_OR_DEPARTMENT_OTHER): Payer: Self-pay

## 2020-11-11 ENCOUNTER — Ambulatory Visit: Payer: Medicare Other | Admitting: Adult Health

## 2020-11-11 MED ORDER — COVID-19 MRNA VACCINE (PFIZER) 30 MCG/0.3ML IM SUSP
INTRAMUSCULAR | 0 refills | Status: DC
  Filled 2020-11-11: qty 0.3, 1d supply, fill #0

## 2020-11-23 ENCOUNTER — Other Ambulatory Visit (HOSPITAL_COMMUNITY): Payer: Self-pay

## 2020-12-07 ENCOUNTER — Ambulatory Visit: Payer: Medicare Other | Admitting: Adult Health

## 2020-12-09 ENCOUNTER — Ambulatory Visit: Payer: Medicare Other | Admitting: Adult Health

## 2021-01-12 ENCOUNTER — Other Ambulatory Visit: Payer: Self-pay | Admitting: Internal Medicine

## 2021-01-12 DIAGNOSIS — N4830 Priapism, unspecified: Secondary | ICD-10-CM

## 2021-01-18 DIAGNOSIS — H401131 Primary open-angle glaucoma, bilateral, mild stage: Secondary | ICD-10-CM | POA: Diagnosis not present

## 2021-01-26 ENCOUNTER — Encounter (HOSPITAL_COMMUNITY): Payer: Self-pay

## 2021-01-26 ENCOUNTER — Emergency Department (HOSPITAL_COMMUNITY): Payer: Medicare Other

## 2021-01-26 ENCOUNTER — Emergency Department (HOSPITAL_COMMUNITY)
Admission: EM | Admit: 2021-01-26 | Discharge: 2021-01-26 | Disposition: A | Discharge status: Home / Self Care | Payer: Medicare Other | Attending: Emergency Medicine | Admitting: Emergency Medicine

## 2021-01-26 DIAGNOSIS — M79675 Pain in left toe(s): Secondary | ICD-10-CM | POA: Diagnosis not present

## 2021-01-26 DIAGNOSIS — I2510 Atherosclerotic heart disease of native coronary artery without angina pectoris: Secondary | ICD-10-CM | POA: Insufficient documentation

## 2021-01-26 DIAGNOSIS — Y990 Civilian activity done for income or pay: Secondary | ICD-10-CM | POA: Insufficient documentation

## 2021-01-26 DIAGNOSIS — Z87891 Personal history of nicotine dependence: Secondary | ICD-10-CM | POA: Diagnosis not present

## 2021-01-26 DIAGNOSIS — S99922A Unspecified injury of left foot, initial encounter: Secondary | ICD-10-CM | POA: Diagnosis not present

## 2021-01-26 DIAGNOSIS — Z79899 Other long term (current) drug therapy: Secondary | ICD-10-CM | POA: Diagnosis not present

## 2021-01-26 DIAGNOSIS — Z7982 Long term (current) use of aspirin: Secondary | ICD-10-CM | POA: Insufficient documentation

## 2021-01-26 DIAGNOSIS — I10 Essential (primary) hypertension: Secondary | ICD-10-CM | POA: Diagnosis not present

## 2021-01-26 DIAGNOSIS — X501XXA Overexertion from prolonged static or awkward postures, initial encounter: Secondary | ICD-10-CM | POA: Diagnosis not present

## 2021-01-26 DIAGNOSIS — W19XXXA Unspecified fall, initial encounter: Secondary | ICD-10-CM

## 2021-01-26 DIAGNOSIS — M79672 Pain in left foot: Secondary | ICD-10-CM | POA: Diagnosis not present

## 2021-01-26 MED ORDER — MELOXICAM 7.5 MG PO TABS: 7.5000 mg | ORAL_TABLET | Freq: Every day | ORAL | 0 refills | Status: DC

## 2021-01-26 MED ORDER — LIDOCAINE 5 % EX PTCH: 1.0000 | MEDICATED_PATCH | CUTANEOUS | 0 refills | Status: DC

## 2021-01-26 NOTE — ED Provider Notes (Signed)
[facility]-EMERGENCY DEPT Provider Note   CSN: [id number] Arrival date & time: 01/26/21  1841    History Chief Complaint  Patient presents with   Foot Injury    Cory Carrillo is a 70 y.o. male with past medical history significant for gout, hypertension, hyperlipidemia who presents for evaluation of left great toe pain.  Began earlier today. States he was working on his deck when twisted his left foot at the first metatarsal. Did not initially feel pain. Now has pain to left great toe.  Is not extended to proximal foot, ankle.  He has been able to ambulate without difficulty.  No redness, swelling or warmth.  No pain prior to injury.  No fever, chills, rashes, lesions, ulcerations, numbness, tingling, swelling.  Denies additional aggravating or alleviating factors.    History obtained from patient and past medical records.  No interpreter used  HPI     Past Medical History:  Diagnosis Date   Anxiety    Cervical stenosis of spine    Depression    GERD (gastroesophageal reflux disease)    Glaucoma    Gout    Hyperlipidemia    Hypertension    Hypogonadism male    Prediabetes    Vitamin D deficiency     Patient Active Problem List   Diagnosis Date Noted   Benign prostatic hyperplasia with nocturia 05/06/2020   Erectile dysfunction 05/05/2020   Aortic atherosclerosis (HCC) by CXR in 2018 05/05/2020   Former smoker 03/31/2019   Overweight (BMI 25.0-29.9) 02/10/2018   CAD in native artery 04/09/2017   Open-angle glaucoma 02/10/2016   Medication management 08/23/2013   Essential hypertension    Mixed hyperlipidemia    GERD     Other abnormal glucose    Gout    Testosterone Deficiency    Vitamin D deficiency     Past Surgical History:  Procedure Laterality Date   LEFT HEART CATH AND CORONARY ANGIOGRAPHY N/A 04/11/2017   Procedure: LEFT HEART CATH AND CORONARY ANGIOGRAPHY;  Surgeon: Lyn Records, MD;  Location: [facility];  Service:  Cardiovascular;  Laterality: N/A;       Family History  Problem Relation Age of Onset   Liver cancer Mother    Heart disease Father    Heart disease Brother    Hypertension Brother    Heart disease Paternal Grandmother    Stroke Paternal Grandmother    Stroke Paternal Grandfather    Healthy Son     Social History   Tobacco Use   Smoking status: Former    Packs/day: 1.50    Years: 25.00    Pack years: 37.50    Types: Cigarettes    Quit date: 07/31/1998    Years since quitting: 22.5   Smokeless tobacco: Never  Substance Use Topics   Alcohol use: Yes    Alcohol/week: 7.0 standard drinks    Types: 7 Standard drinks or equivalent per week   Drug use: No    Home Medications Prior to Admission medications   Medication Sig Start Date End Date Taking? Authorizing Provider  lidocaine (LIDODERM) 5 % Place 1 patch onto the skin daily. Remove & Discard patch within 12 hours or as directed by MD 01/26/21  Yes Chiffon Kittleson A, PA-C  meloxicam (MOBIC) 7.5 MG tablet Take 1 tablet (7.5 mg total) by mouth daily. 01/26/21  Yes Darin Redmann A, PA-C  Ascorbic Acid (VITAMIN C) 1000 MG tablet Take 1,000 mg by mouth daily.  [provider]  aspirin 81 MG tablet Take 81 mg by mouth daily.    [provider]  atenolol (TENORMIN) 25 MG tablet TAKE 1 TABLET DAILY FOR BLOOD PRESSURE 08/01/20   Lucky CowboyMcKeown, William, MD  atorvastatin (LIPITOR) 20 MG tablet TAKE 1 TABLET BY MOUTH EVERY DAY FOR CHOLESTEROL 10/24/20   Elder NegusMcClanahan, Kyra, NP  Cholecalciferol (VITAMIN D PO) Take 8,000 Units by mouth daily.     [provider]  citalopram (CELEXA) 20 MG tablet Take 1 tablet Daily for Mood 05/06/20   Judd Gaudierorbett, Ashley, NP  COVID-19 mRNA vaccine, Pfizer, 30 MCG/0.3ML injection Inject into the muscle. 11/03/20   Judyann MunsonSnider, Cynthia, MD  finasteride (PROSCAR) 5 MG tablet TAKE 1 TABLET DAILY FOR PROSTATE 06/06/20   Lucky CowboyMcKeown, William, MD  latanoprost (XALATAN) 0.005 % ophthalmic solution Place 1 drop  into both eyes at bedtime.    [provider]  losartan (COZAAR) 50 MG tablet TAKE 1 TABLET DAILY FOR BLOOD PRESSURE 05/06/20   Judd Gaudierorbett, Ashley, NP  Multiple Vitamins-Minerals (MULTIVITAMIN PO) Take 1 tablet by mouth daily.     [provider]  tadalafil (CIALIS) 20 MG tablet Take 1/2 to 1 tablet every 2 to 3 days as needed for XXXX 07/12/20   Lucky CowboyMcKeown, William, MD  tamsulosin [facility](FLOMAX) 0.4 MG CAPS capsule Take    2 capsules   at Bedtime      for Prostate 05/06/20   Lucky CowboyMcKeown, William, MD  testosterone cypionate (DEPOTESTOSTERONE CYPIONATE) 200 MG/ML injection INJECT 2 ML INTO MUSCLE EVERY 2 WEEKS 02/18/20   Lucky CowboyMcKeown, William, MD  timolol (BETIMOL) 0.5 % ophthalmic solution Place 1 drop into both eyes in the morning, at noon, and at bedtime.     [provider]    Allergies    Ace inhibitors and Other  Review of Systems   Review of Systems  Constitutional: Negative.   HENT: Negative.    Respiratory: Negative.    Cardiovascular: Negative.   Gastrointestinal: Negative.   Genitourinary: Negative.   Musculoskeletal:        Left foot pain at 1st metatarsal  Skin: Negative.   Neurological: Negative.   All other systems reviewed and are negative.  Physical Exam Updated Vital Signs BP 122/75   Pulse 65   Temp 98.2 F (36.8 C) (Oral)   Resp 18   SpO2 99%   Physical Exam Vitals and nursing note reviewed.  Constitutional:      General: He is not in acute distress.    Appearance: He is well-developed. He is not ill-appearing, toxic-appearing or diaphoretic.  HENT:     Head: Normocephalic and atraumatic.     Nose: Nose normal.     Mouth/Throat:     Mouth: Mucous membranes are moist.  Eyes:     Pupils: Pupils are equal, round, and reactive to light.  Cardiovascular:     Rate and Rhythm: Normal rate and regular rhythm.     Pulses: Normal pulses.          Dorsalis pedis pulses are 2+ on the right side and 2+ on the left side.     Heart sounds: Normal heart  sounds.  Pulmonary:     Effort: Pulmonary effort is normal. No respiratory distress.     Breath sounds: Normal breath sounds.  Abdominal:     General: Bowel sounds are normal. There is no distension.     Palpations: Abdomen is soft.  Musculoskeletal:        General: Normal range of motion.  Cervical back: Normal range of motion and neck supple.       Feet:     Comments: Tenderness to left great toe.  No bony tenderness to midfoot, proximal foot.  Full range of motion to ankle.  Able to flex at digits.  Compartments soft.  Feet:     Right foot:     Skin integrity: Skin integrity normal.     Toenail Condition: Right toenails are normal.     Left foot:     Skin integrity: Skin integrity normal.     Toenail Condition: Left toenails are normal.     Comments: Diffuse tenderness to the left great toe.  No edema, erythema or warmth.  No fluctuance or induration.  No macerated skin.  No ulcerations. Skin:    General: Skin is warm and dry.     Capillary Refill: Capillary refill takes less than 2 seconds.     Comments: No overlying skin changes  Neurological:     General: No focal deficit present.     Mental Status: He is alert and oriented to person, place, and time.     Sensory: Sensation is intact.     Motor: Motor function is intact.     Gait: Gait is intact.     Comments: Intact sensation.   Ambulatory without difficulty.    ED Results / Procedures / Treatments   Labs (all labs ordered are listed, but only abnormal results are displayed) Labs Reviewed - No data to display  EKG None  Radiology DG Foot Complete Left  Result Date: 01/26/2021 CLINICAL DATA:  Left foot/great toe injury, pain EXAM: LEFT FOOT - COMPLETE 3+ VIEW COMPARISON:  None. FINDINGS: There is no evidence of fracture or dislocation. There is no evidence of arthropathy or other focal bone abnormality. Soft tissues are unremarkable. IMPRESSION: Negative. Electronically Signed   By: Charlett Nose M.D.   On:  01/26/2021 19:43    Procedures Procedures   Medications Ordered in ED Medications - No data to display  ED Course  I have reviewed the triage vital signs and the nursing notes.  Pertinent labs & imaging results that were available during my care of the patient were reviewed by me and considered in my medical decision making (see chart for details).  Here for evaluation of left great toe pain after stepping off of a piece of wood.  Afebrile, nonseptic, non-ill-appearing. Does have history of gout however no overlying erythema, warmth, swelling.  No evidence of skin breakdown.  He has normal musculoskeletal exam.  He is neurovascularly intact.  He is ambulatory without difficulty.  Xray here personally reviewed and interpreted does not show any evidence of fracture, dislocation.  Low suspicion for septic joint, hemarthrosis, occult fracture, VTE, ischemic limb.  DC home with symptomatic management.  He will follow-up with his PCP for reevaluation and return for new or worsening symptoms.  The patient has been appropriately medically screened and/or stabilized in the ED. I have low suspicion for any other emergent medical condition which would require further screening, evaluation or treatment in the ED or require inpatient management.  Patient is hemodynamically stable and in no acute distress.  Patient able to ambulate in department prior to ED.  Evaluation does not show acute pathology that would require ongoing or additional emergent interventions while in the emergency department or further inpatient treatment.  I have discussed the diagnosis with the patient and answered all questions.  Pain is been managed while in the emergency  department and patient has no further complaints prior to discharge.  Patient is comfortable with plan discussed in room and is stable for discharge at this time.  I have discussed strict return precautions for returning to the emergency department.  Patient was  encouraged to follow-up with PCP/specialist refer to at discharge.     MDM Rules/Calculators/A&P                           Final Clinical Impression(s) / ED Diagnoses Final diagnoses:  Fall  Pain of toe of left foot    Rx / DC Orders ED Discharge Orders          Ordered    meloxicam (MOBIC) 7.5 MG tablet  Daily        01/26/21 2029    lidocaine (LIDODERM) 5 %  Every 24 hours        01/26/21 2029             Brayten Komar A, PA-C 01/27/21 0011    Benjiman Core, MD 01/27/21 1458

## 2021-01-26 NOTE — ED Triage Notes (Signed)
Patient reports working on his deck this afternoon replacing boards and step on a 2 by 4 and slipped off it.   C/o left pain near big toe and feels like it is broke. It is not broken.   7/10 pain

## 2021-01-26 NOTE — ED Provider Notes (Signed)
Emergency Medicine Provider Triage Evaluation Note  KHANI PAINO , a 70 y.o. male  was evaluated in triage.  Pt complains of left foot pain.  Was working on his deck today and stepped off of a 2x4.  Has pain to the right big toe.  No skin breakage, no numbness or tingling.  Thinks it may be broken.  Review of Systems  Positive: As above Negative: As above  Physical Exam  BP 131/78 (BP Location: Left Arm)   Pulse 77   Temp 98.3 F (36.8 C) (Oral)   Resp 18   SpO2 97%  Gen:   Awake, no distress   Resp:  Normal effort  MSK:   Moves extremities without difficulty  Other:    Medical Decision Making  Medically screening exam initiated at 7:02 PM.  Appropriate orders placed.  Candelaria Celeste was informed that the remainder of the evaluation will be completed by another provider, this initial triage assessment does not replace that evaluation, and the importance of remaining in the ED until their evaluation is complete.     Leone Brand 01/26/21 1903    Cheryll Cockayne, MD 02/04/21 [phone]

## 2021-01-26 NOTE — Discharge Instructions (Addendum)
Take the medication as prescribed.  Return for new or worsening symptoms. 

## 2021-01-27 ENCOUNTER — Encounter: Payer: Medicare Other | Admitting: Internal Medicine

## 2021-02-07 ENCOUNTER — Other Ambulatory Visit (HOSPITAL_COMMUNITY): Payer: Self-pay

## 2021-02-22 DIAGNOSIS — R351 Nocturia: Secondary | ICD-10-CM | POA: Diagnosis not present

## 2021-02-22 DIAGNOSIS — N486 Induration penis plastica: Secondary | ICD-10-CM | POA: Diagnosis not present

## 2021-02-22 DIAGNOSIS — N5201 Erectile dysfunction due to arterial insufficiency: Secondary | ICD-10-CM | POA: Diagnosis not present

## 2021-02-22 DIAGNOSIS — R3912 Poor urinary stream: Secondary | ICD-10-CM | POA: Diagnosis not present

## 2021-02-22 DIAGNOSIS — E291 Testicular hypofunction: Secondary | ICD-10-CM | POA: Diagnosis not present

## 2021-02-22 DIAGNOSIS — N401 Enlarged prostate with lower urinary tract symptoms: Secondary | ICD-10-CM | POA: Diagnosis not present

## 2021-03-15 ENCOUNTER — Encounter: Payer: Self-pay | Admitting: Internal Medicine

## 2021-03-15 NOTE — Progress Notes (Signed)
Comprehensive Evaluation & Examination  Future Appointments  Date Time Provider Department Center  03/16/2021 11:00 AM Lucky Cowboy, MD [facility] None  05/11/2021  - Wellness  2:30 PM Judd Gaudier, NP [facility] None  03/16/2022  - CPE 11:00 AM Lucky Cowboy, MD [facility] None            This very nice 70 y.o. MWM presents for a  comprehensive evaluation and management of multiple medical co-morbidities.  Patient has been followed for HTN, HLD, Prediabetes and Vitamin D Deficiency. CXR in 2018 showed Aortic Atherosclerosis. Patient also has hx of gout (off meds).       HTN predates since 63. Patient's BP has been controlled at home.  Today's BP is at goal - 124/76.   Patient had a heart cath in 2018 with insignificant CAD.   Patient denies any cardiac symptoms as chest pain, palpitations, shortness of breath, dizziness or ankle swelling.       Patient's hyperlipidemia is controlled with diet and medications. Patient denies myalgias or other medication SE's. Last lipids were at goal except elevated Trig's:             Lab Results  Component Value Date   CHOL 139 08/10/2020   HDL 66 08/10/2020   LDLCALC 44 08/10/2020   TRIG 232 (H) 08/10/2020   CHOLHDL 2.1 08/10/2020                                    Patient has hx/o Testosterone Deficiency & has been on Testosterone inj  since 2015  with improved stamina & sense of well being.  Patient ALSO has hx/o prediabetes (A1c 5.7% /2012) and patient denies reactive hypoglycemic symptoms, visual blurring, diabetic polys or paresthesias. Last A1c was normal & at goal:    Lab Results  Component Value Date   HGBA1C 5.0 08/10/2020          Finally, patient has history of Vitamin D Deficiency ("14" /2008) and last vitamin D was at goal:   Lab Results  Component Value Date   VD25OH 64 08/10/2020     Current Outpatient Medications on File Prior to Visit  Medication Sig   VITAMIN C  1000 MG tablet Take  daily.   aspirin  81 MG tablet Take daily.   atenolol (TENORMIN) 25 MG tablet TAKE 1 TABLET DAILY    atorvastatin (LIPITOR) 20 MG tablet TAKE 1 TABLET EVERY DAY    Cholecalciferol (VITAMIN D PO) Take 8,000 Units by mouth daily.    citalopram (CELEXA) 20 MG tablet Take 1 tablet Daily for Mood   XALATAN 0.005 % ophth soln Place 1 drop into both eyes at bedtime.   losartan (COZAAR) 50 MG tablet TAKE 1 TABLET DAILY    Multiple Vitamins-Minerals  Take 1 tablet by mouth daily.    tadalafil (CIALIS) 20 MG tablet Take 1/2 to 1 tablet every 2 to 3 days as needed    tamsulosin (FLOMAX) 0.4 MG CAPS  Take    2 capsules   at Bedtime      for Prostate   testosterone cypio 200 MG/ML injection INJECT 1/2 ML    IM   EVERY   WEEK   BETIMOL 0.5 % ophth soln Place 1 drop into both eyes in the morning, at noon, and at bedtime.     Allergies  Allergen Reactions   Ace Inhibitors     cough  Synthetic opioids Itching     Full Body itching     Past Medical History:  Diagnosis Date   Anxiety    Cervical stenosis of spine    Depression    GERD (gastroesophageal reflux disease)    Glaucoma    Gout    Hyperlipidemia    Hypertension    Hypogonadism male    Prediabetes    Vitamin D deficiency      Health Maintenance  Topic Date Due   Zoster Vaccines- Shingrix (1 of 2) Never done   COVID-19 Vaccine (5 - Booster for Pfizer series) 03/05/2021   INFLUENZA VACCINE  02/28/2021   Fecal DNA (Cologuard)  03/22/2021   TETANUS/TDAP  08/19/2022   Hepatitis C Screening  Completed   PNA vac Low Risk Adult  Completed   HPV VACCINES  Aged Out     Immunization History  Administered Date(s) Administered   Influenza, High Dose Seasonal  09/02/2018, 05/06/2020   Influenza 05/15/2019   PFIZER  Covid-19 Tri-Sucrose Vacc 11/03/2020   PFIZER SARS-COV-2 Vacc 09/13/2019, 10/06/2019, 05/09/2020   PPD Test 01/02/2014, 01/21/2015   Pneumococcal -13 12/18/2018   Pneumococcal -23 01/27/2020   Pneumococcal -23 08/23/1992   Tdap  08/19/2012    03/22/2018  - Cologard - Negative - 3 year f/u due now.  Past Surgical History:  Procedure Laterality Date   LEFT HEART CATH AND CORONARY ANGIOGRAPHY - Lyn Records, MD; N/A 04/11/2017     Family History  Problem Relation Age of Onset   Liver cancer Mother    Heart disease Father    Heart disease Brother    Hypertension Brother    Heart disease Paternal Grandmother    Stroke Paternal Grandmother    Stroke Paternal Grandfather    Healthy Son     Social History   Socioeconomic History   Marital status: Married    Spouse name: Vickie   Number of children: 1 son & 1 daughter   Occupational History   Retired  Tobacco Use   Smoking status: Former    Packs/day: 1.50    Years: 25.00    Pack years: 37.50    Types: Cigarettes    Quit date: 07/31/1998    Years since quitting: 22.6   Smokeless tobacco: Never  Substance and Sexual Activity   Alcohol use: Yes    Alcohol/week: 7.0 standard drinks    Types: 7 Standard drinks or equivalent per week   Drug use: No   Sexual activity: Not on file      ROS Constitutional: Denies fever, chills, weight loss/gain, headaches, insomnia,  night sweats or change in appetite. Does c/o fatigue. Eyes: Denies redness, blurred vision, diplopia, discharge, itchy or watery eyes.  ENT: Denies discharge, congestion, post nasal drip, epistaxis, sore throat, earache, hearing loss, dental pain, Tinnitus, Vertigo, Sinus pain or snoring.  Cardio: Denies chest pain, palpitations, irregular heartbeat, syncope, dyspnea, diaphoresis, orthopnea, PND, claudication or edema Respiratory: denies cough, dyspnea, DOE, pleurisy, hoarseness, laryngitis or wheezing.  Gastrointestinal: Denies dysphagia, heartburn, reflux, water brash, pain, cramps, nausea, vomiting, bloating, diarrhea, constipation, hematemesis, melena, hematochezia, jaundice or hemorrhoids Genitourinary: Denies dysuria, frequency, urgency, nocturia, hesitancy, discharge, hematuria or  flank pain Musculoskeletal: Denies arthralgia, myalgia, stiffness, Jt. Swelling, pain, limp or strain/sprain. Denies Falls. Skin: Denies puritis, rash, hives, warts, acne, eczema or change in skin lesion Neuro: No weakness, tremor, incoordination, spasms, paresthesia or pain Psychiatric: Denies confusion, memory loss or sensory loss. Denies Depression. Endocrine: Denies change in weight, skin, hair  change, nocturia, and paresthesia, diabetic polys, visual blurring or hyper / hypo glycemic episodes.  Heme/Lymph: No excessive bleeding, bruising or enlarged lymph nodes.   Physical Exam  BP 124/76   Pulse (!) 47   Temp (!) 97.5 F (36.4 C)   Resp 16   Ht 6\' 1"  (1.854 m)   Wt 201 lb 9.6 oz (91.4 kg)   SpO2 98%   BMI 26.60 kg/m   General Appearance: Well nourished and well groomed and in no apparent distress.  Eyes: PERRLA, EOMs, conjunctiva no swelling or erythema, normal fundi and vessels. Sinuses: No frontal/maxillary tenderness ENT/Mouth: EACs patent / TMs  nl. Nares clear without erythema, swelling, mucoid exudates. Oral hygiene is good. No erythema, swelling, or exudate. Tongue normal, non-obstructing. Tonsils not swollen or erythematous. Hearing normal.  Neck: Supple, thyroid not palpable. No bruits, nodes or JVD. Respiratory: Respiratory effort normal.  BS equal and clear bilateral without rales, rhonci, wheezing or stridor. Cardio: Heart sounds are normal with regular rate and rhythm and no murmurs, rubs or gallops. Peripheral pulses are normal and equal bilaterally without edema. No aortic or femoral bruits. Chest: symmetric with normal excursions and percussion.  Abdomen: Soft, with Nl bowel sounds. Nontender, no guarding, rebound, hernias, masses, or organomegaly.  Lymphatics: Non tender without lymphadenopathy.  Musculoskeletal: Full ROM all peripheral extremities, joint stability, 5/5 strength, and normal gait. Skin: Warm and dry without rashes, lesions, cyanosis, clubbing  or  ecchymosis.  Neuro: Cranial nerves intact, reflexes equal bilaterally. Normal muscle tone, no cerebellar symptoms. Sensation intact.  Pysch: Alert and oriented X 3 with normal affect, insight and judgment appropriate.   Assessment and Plan  1. Essential hypertension  - EKG 12-Lead - COMPLETE METABOLIC PANEL WITH GFR - Magnesium - TSH  2. Hyperlipidemia, mixed  - EKG 12-Lead - Lipid panel - TSH  3. Abnormal glucose  - Hemoglobin A1c - Insulin, random  4. Vitamin D deficiency  - VITAMIN D 25 Hydroxy  5. Testosterone Deficiency   6. Aortic atherosclerosis (HCC)  - EKG 12-Lead  7. BPH with obstruction/lower urinary tract symptoms  - PSA  8. Idiopathic gout  - Uric acid  9. Prostate cancer screening  - PSA  10. Screening for colorectal cancer  - Cologuard  11. Screening for ischemic heart disease  - EKG 12-Lead  12. FHx: heart disease  - EKG 12-Lead  13. Former smoker  - EKG 12-Lead  14. Screening for AAA (aortic abdominal aneurysm)   15. Medication management  - COMPLETE METABOLIC PANEL WITH GFR - Magnesium - Lipid panel - TSH - Hemoglobin A1c - Insulin, random - VITAMIN D 25 Hydroxy  - Uric acid          Patient was counseled in prudent diet, weight control to achieve/maintain BMI less than 25, BP monitoring, regular exercise and medications as discussed.  Discussed med effects and SE's. Routine screening labs and tests as requested with regular follow-up as recommended. Over 40 minutes of exam, counseling, chart review and high complex critical decision making was performed   , MD

## 2021-03-15 NOTE — Patient Instructions (Signed)

## 2021-03-16 ENCOUNTER — Other Ambulatory Visit: Payer: Self-pay

## 2021-03-16 ENCOUNTER — Encounter: Payer: Self-pay | Admitting: Internal Medicine

## 2021-03-16 ENCOUNTER — Ambulatory Visit (INDEPENDENT_AMBULATORY_CARE_PROVIDER_SITE_OTHER): Payer: Medicare Other | Admitting: Internal Medicine

## 2021-03-16 VITALS — BP 124/76 | HR 47 | Temp 97.5°F | Resp 16 | Ht 73.0 in | Wt 201.6 lb

## 2021-03-16 DIAGNOSIS — I10 Essential (primary) hypertension: Secondary | ICD-10-CM | POA: Diagnosis not present

## 2021-03-16 DIAGNOSIS — Z8249 Family history of ischemic heart disease and other diseases of the circulatory system: Secondary | ICD-10-CM | POA: Diagnosis not present

## 2021-03-16 DIAGNOSIS — M1000 Idiopathic gout, unspecified site: Secondary | ICD-10-CM

## 2021-03-16 DIAGNOSIS — Z125 Encounter for screening for malignant neoplasm of prostate: Secondary | ICD-10-CM

## 2021-03-16 DIAGNOSIS — R7309 Other abnormal glucose: Secondary | ICD-10-CM

## 2021-03-16 DIAGNOSIS — E782 Mixed hyperlipidemia: Secondary | ICD-10-CM | POA: Diagnosis not present

## 2021-03-16 DIAGNOSIS — N401 Enlarged prostate with lower urinary tract symptoms: Secondary | ICD-10-CM | POA: Diagnosis not present

## 2021-03-16 DIAGNOSIS — E291 Testicular hypofunction: Secondary | ICD-10-CM

## 2021-03-16 DIAGNOSIS — Z79899 Other long term (current) drug therapy: Secondary | ICD-10-CM

## 2021-03-16 DIAGNOSIS — I700 Atherosclerosis of aorta: Secondary | ICD-10-CM

## 2021-03-16 DIAGNOSIS — E559 Vitamin D deficiency, unspecified: Secondary | ICD-10-CM

## 2021-03-16 DIAGNOSIS — Z87891 Personal history of nicotine dependence: Secondary | ICD-10-CM | POA: Diagnosis not present

## 2021-03-16 DIAGNOSIS — N138 Other obstructive and reflux uropathy: Secondary | ICD-10-CM

## 2021-03-16 DIAGNOSIS — Z1211 Encounter for screening for malignant neoplasm of colon: Secondary | ICD-10-CM

## 2021-03-16 DIAGNOSIS — Z136 Encounter for screening for cardiovascular disorders: Secondary | ICD-10-CM

## 2021-03-16 MED ORDER — TESTOSTERONE CYPIONATE 200 MG/ML IM SOLN: INTRAMUSCULAR | 1 refills | Status: DC

## 2021-03-16 NOTE — Progress Notes (Signed)
Aorta Scan < 3 cm 

## 2021-03-17 LAB — COMPLETE METABOLIC PANEL WITH GFR
AG Ratio: 2.3 (calc) (ref 1.0–2.5)
ALT: 26 U/L (ref 9–46)
AST: 19 U/L (ref 10–35)
Albumin: 4.6 g/dL (ref 3.6–5.1)
Alkaline phosphatase (APISO): 48 U/L (ref 35–144)
BUN: 11 mg/dL (ref 7–25)
CO2: 29 mmol/L (ref 20–32)
Calcium: 9.5 mg/dL (ref 8.6–10.3)
Chloride: 102 mmol/L (ref 98–110)
Creat: 0.82 mg/dL (ref 0.70–1.35)
Globulin: 2 g/dL (calc) (ref 1.9–3.7)
Glucose, Bld: 85 mg/dL (ref 65–99)
Potassium: 4.7 mmol/L (ref 3.5–5.3)
Sodium: 138 mmol/L (ref 135–146)
Total Bilirubin: 0.7 mg/dL (ref 0.2–1.2)
Total Protein: 6.6 g/dL (ref 6.1–8.1)
eGFR: 96 mL/min/{1.73_m2} (ref >=60)

## 2021-03-17 LAB — HEMOGLOBIN A1C
Hgb A1c MFr Bld: 5.1 % of total Hgb (ref <5.7)
Mean Plasma Glucose: 100 mg/dL
eAG (mmol/L): 5.5 mmol/L

## 2021-03-17 LAB — LIPID PANEL
Cholesterol: 174 mg/dL (ref <200)
HDL: 58 mg/dL (ref >=40)
LDL Cholesterol (Calc): 95 mg/dL (calc)
Non-HDL Cholesterol (Calc): 116 mg/dL (calc) (ref <130)
Total CHOL/HDL Ratio: 3 (calc) (ref <5.0)
Triglycerides: 117 mg/dL (ref <150)

## 2021-03-17 LAB — VITAMIN D 25 HYDROXY (VIT D DEFICIENCY, FRACTURES): Vit D, 25-Hydroxy: 88 ng/mL (ref 30–100)

## 2021-03-17 LAB — MAGNESIUM: Magnesium: 2 mg/dL (ref 1.5–2.5)

## 2021-03-17 LAB — INSULIN, RANDOM: Insulin: 2.7 u[IU]/mL

## 2021-03-17 LAB — URIC ACID: Uric Acid, Serum: 7 mg/dL (ref 4.0–8.0)

## 2021-03-17 LAB — TSH: TSH: 1.27 mIU/L (ref 0.40–4.50)

## 2021-03-17 NOTE — Progress Notes (Signed)
============================================================ -   Test results slightly outside the reference range are not unusual. If there is anything important, I will review this with you,  otherwise it is considered normal test values.  If you have further questions,  please do not hesitate to contact me at the office or via My Chart.  ============================================================ ============================================================  -  Total  Chol = 174     &    LDL Chol = 95   -    Both  Excellent   - Very low risk for Heart Attack  / Stroke ============================================================ ============================================================  -  A1c - Normal - No Diabetes  - Great   ! ============================================================ ============================================================  -  Vitamin D = 88 - Excellent   ! ============================================================ ============================================================  -  Uric Acid / Gout Test - Normal  & OK  ============================================================ ============================================================  -  All Else - CBC - Kidneys - Electrolytes - Liver - Magnesium & Thyroid    - all  Normal / OK ============================================================ ============================================================  -  Keep up the [facility] #Work  ! ============================================================ ============================================================

## 2021-03-25 DIAGNOSIS — Z1211 Encounter for screening for malignant neoplasm of colon: Secondary | ICD-10-CM | POA: Diagnosis not present

## 2021-03-25 DIAGNOSIS — E291 Testicular hypofunction: Secondary | ICD-10-CM | POA: Diagnosis not present

## 2021-03-25 DIAGNOSIS — Z1212 Encounter for screening for malignant neoplasm of rectum: Secondary | ICD-10-CM | POA: Diagnosis not present

## 2021-03-27 ENCOUNTER — Other Ambulatory Visit: Payer: Self-pay | Admitting: Internal Medicine

## 2021-03-27 MED ORDER — TADALAFIL 20 MG PO TABS: ORAL_TABLET | ORAL | 0 refills | Status: DC

## 2021-03-29 DIAGNOSIS — N5201 Erectile dysfunction due to arterial insufficiency: Secondary | ICD-10-CM | POA: Diagnosis not present

## 2021-03-29 DIAGNOSIS — E291 Testicular hypofunction: Secondary | ICD-10-CM | POA: Diagnosis not present

## 2021-04-01 LAB — COLOGUARD: Cologuard: NEGATIVE

## 2021-04-01 NOTE — Progress Notes (Signed)
-   Great News - Cologard - NEGATIVE  - Repeat in 3 years

## 2021-05-03 ENCOUNTER — Other Ambulatory Visit: Payer: Self-pay | Admitting: Internal Medicine

## 2021-05-05 ENCOUNTER — Ambulatory Visit: Payer: Medicare Other | Attending: Internal Medicine

## 2021-05-05 ENCOUNTER — Other Ambulatory Visit (HOSPITAL_BASED_OUTPATIENT_CLINIC_OR_DEPARTMENT_OTHER): Payer: Self-pay

## 2021-05-05 DIAGNOSIS — Z23 Encounter for immunization: Secondary | ICD-10-CM

## 2021-05-05 MED ORDER — MODERNA COVID-19 BIVAL BOOSTER 50 MCG/0.5ML IM SUSP
INTRAMUSCULAR | 0 refills | Status: DC
  Filled 2021-05-05: qty 0.5, 1d supply, fill #0

## 2021-05-05 NOTE — Progress Notes (Signed)
   Covid-[facility] Vaccination [facility]  Name:  Cory Carrillo    MRN: [id number] DOB: 02/26/1951  05/05/2021  [name] was observed post Covid-19 immunization for 15 minutes without incident. He was provided with Vaccine Information Sheet and instruction to access the V-Safe system.   [name] was instructed to call 911 with any severe reactions post vaccine: Difficulty breathing  Swelling of face and throat  A fast heartbeat  A bad rash all over body  Dizziness and weakness

## 2021-05-08 ENCOUNTER — Other Ambulatory Visit: Payer: Self-pay | Admitting: Internal Medicine

## 2021-05-08 ENCOUNTER — Other Ambulatory Visit: Payer: Self-pay | Admitting: Adult Health

## 2021-05-08 DIAGNOSIS — N138 Other obstructive and reflux uropathy: Secondary | ICD-10-CM

## 2021-05-11 ENCOUNTER — Ambulatory Visit: Payer: Medicare Other | Admitting: Adult Health

## 2021-05-21 ENCOUNTER — Other Ambulatory Visit: Payer: Self-pay | Admitting: Adult Health

## 2021-05-21 DIAGNOSIS — I10 Essential (primary) hypertension: Secondary | ICD-10-CM

## 2021-05-25 DIAGNOSIS — F325 Major depressive disorder, single episode, in full remission: Secondary | ICD-10-CM | POA: Insufficient documentation

## 2021-05-25 NOTE — Progress Notes (Deleted)
MEDICARE ANNUAL WELLNESS VISIT AND FOLLOW UP Assessment:   Diagnoses and all orders for this visit:  Annual Medicare Wellness Visit Due annually  Health maintenance reviewed ***  Atherosclerosis of aorta (HCC) - CXR in 2018 Control blood pressure, cholesterol, glucose, increase exercise.   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 Rennis Golden Control blood pressure, cholesterol, glucose, increase exercise.  Continue ASA  Essential hypertension Continue medication Monitor blood pressure at home; call if consistently over 130/80 Continue DASH diet.   Reminder to go to the ER if any CP, SOB, nausea, dizziness, severe HA, changes vision/speech, left arm numbness and tingling and jaw pain.  Gastroesophageal reflux disease, esophagitis presence not specified Well managed on current medications Discussed diet, avoiding triggers and other lifestyle changes  Testosterone Deficiency - continue to monitor, states medication is helping with symptoms of low T.   Vitamin D deficiency At goal at recent check; continue to recommend supplementation for goal of 60-100 Defer vitamin D level  Other abnormal glucose Recent A1Cs at goal Discussed diet/exercise, weight management  Defer A1C; check CMP  Open-angle glaucoma of right eye, unspecified glaucoma stage, unspecified open-angle glaucoma type Continue drops, followed by ophthalmology  Mixed hyperlipidemia Continue medications, LDL goal <70 Continue low cholesterol diet and exercise.  Check lipid panel.   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   Over 30 minutes of exam, counseling, chart review, and critical decision making was performed  Future Appointments  Date Time Provider Department Center  05/26/2021  2:30 PM Judd Gaudier, NP [facility] None  03/16/2022 11:00 AM Lucky Cowboy, MD [facility] None     Plan:   During the course of the visit the patient was educated and counseled about appropriate screening and preventive services including:   Pneumococcal vaccine  Influenza vaccine Prevnar 13 Td vaccine Screening electrocardiogram Colorectal cancer screening Diabetes screening Glaucoma screening Nutrition counseling    Subjective:  Cory Carrillo is a 70 y.o. male who presents for Medicare Annual Wellness Visit and 3 month follow up for HTN, hyperlipidemia, glucose management, and vitamin D Def.   He has some chronic left hip/knee pain intermittently thought to be r/t lower back, has had injection in the past that worked well by Dr. Ethelene Hal at [facility]. He reports significantly improved in the interim. Pain is typical after long periods of standing. He declines interventions at this time. Takes advil  PRN, occasionally.   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 ***  BMI is There is no height or weight on file to calculate BMI., he has not been working on diet, walks 15 min twice a week without problems, does a lot  of yardwork. He is trying to  "eat better" - choosing better.  Wt Readings from Last 3 Encounters:  03/16/21 201 lb 9.6 oz (91.4 kg)  08/10/20 207 lb 9.6 oz (94.2 kg)  05/06/20 208 lb (94.3 kg)   Patient had a heart cath in Sept 2018 by Dr Rennis Golden after c/o [place] which demonstrated findings of minimal CAD, and was recommended medical treatment.  He does have aortic atherosclerosis per CXR 2018 His blood pressure has been controlled at home, today their BP is   He does not workout. He denies chest pain, shortness of breath, dizziness.   He is on cholesterol medication (atorvastatin 20 mg daily) and denies myalgias. His cholesterol is *** at goal. The cholesterol last visit was:   Lab Results  Component Value Date   CHOL 174 03/16/2021   HDL 58 03/16/2021   LDLCALC 95 03/16/2021   TRIG 117 03/16/2021   CHOLHDL 3.0 03/16/2021   He has not been working on diet and exercise for glucose management, and denies foot ulcerations, increased appetite, nausea, paresthesia of the feet, polydipsia, polyuria, visual disturbances, vomiting and weight loss. Last A1C in the office was:  Lab Results  Component Value Date   HGBA1C 5.1 03/16/2021   Last GFR Lab Results  Component Value Date   GFRNONAA 80 08/10/2020   Patient is on Vitamin D supplement.   Lab Results  Component Value Date   VD25OH 65 03/16/2021     He has a history of testosterone deficiency and is on testosterone replacement, taking 200 mg q2 weeks, last injection was 11 days ago.  He states that the testosterone helps with his energy, libido, muscle mass. Lab Results  Component Value Date   TESTOSTERONE 1,374 (H) 08/10/2020   He is also on cialis 20 mg PRN for ED and feels this works well for him.   Patient has diagnosis of gout, hasn't been on a daily agent, but denies recent flares.  Lab Results  Component Value Date   LABURIC 7.0 03/16/2021   On finasteride and tamsulosin for BPH with nocturia  Lab Results  Component Value Date   PSA 0.7  01/27/2020   PSA 0.5 12/18/2018   PSA 0.7 03/13/2017     Medication Review:  Current Outpatient Medications (Endocrine & Metabolic):    testosterone cypionate (DEPOTESTOSTERONE CYPIONATE) 200 MG/ML injection, INJECT 2 ML INTO MUSCLE EVERY 2 WEEKS  Current Outpatient Medications (Cardiovascular):    atenolol (TENORMIN) 25 MG tablet, TAKE 1 TABLET DAILY FOR BLOOD PRESSURE   atorvastatin (LIPITOR) 20 MG tablet, TAKE 1 TABLET BY MOUTH EVERY DAY FOR CHOLESTEROL   tadalafil (CIALIS) 20 MG tablet, Take 1/2 to 1 tablet every 2 to 3 days as needed for XXXX   Current Outpatient Medications (Analgesics):    aspirin 81 MG tablet, Take 81 mg by mouth daily.   meloxicam (MOBIC) 7.5 MG tablet, Take 1 tablet (7.5 mg total) by mouth daily.   Current Outpatient Medications (Other):    Ascorbic Acid (VITAMIN C) 1000 MG tablet, Take 1,000 mg by mouth daily.   Cholecalciferol (VITAMIN D PO), Take 8,000 Units by mouth daily.    citalopram (CELEXA) 20 MG tablet, Take  1 tablet  Daily for Mood                  /     TAKE 1 TABLET BY MOUTH DAILY FOR MOOD   COVID-19 mRNA bivalent vaccine, Moderna, (MODERNA COVID-19 BIVAL BOOSTER) 50 MCG/0.5ML injection, Inject into  the muscle.   COVID-19 mRNA vaccine, Pfizer, 30 MCG/0.3ML injection, Inject into the muscle.   latanoprost (XALATAN) 0.005 % ophthalmic solution, Place 1 drop into both eyes at bedtime.   Multiple Vitamins-Minerals (MULTIVITAMIN PO), Take 1 tablet by mouth daily.    tamsulosin (FLOMAX) 0.4 MG CAPS capsule, Take  2 capsules  at Bedtime  for Prostate   timolol (BETIMOL) 0.5 % ophthalmic solution, Place 1 drop into both eyes in the morning, at noon, and at bedtime.   Allergies: Allergies  Allergen Reactions   Ace Inhibitors     cough   Other Itching    Synthetic opiods  Full Body itching    Current Problems (verified) has Essential hypertension; Mixed hyperlipidemia; GERD ; Other abnormal glucose; Gout; Testosterone Deficiency; Vitamin D  deficiency; Medication management; Open-angle glaucoma; CAD in native artery; Overweight (BMI 25.0-29.9); Former smoker (37.5 pack year, quit 2000); Erectile dysfunction; Aortic atherosclerosis (HCC) by CXR in 2018; and Benign prostatic hyperplasia with nocturia on their problem list.  Screening Tests Immunization History  Administered Date(s) Administered   Influenza, High Dose Seasonal PF 09/02/2018, 05/06/2020   Influenza-Unspecified 05/15/2019   Moderna Covid-19 Vaccine Bivalent Booster 24yrs & up 05/05/2021   PFIZER Comirnaty(Gray Top)Covid-19 Tri-Sucrose Vaccine 11/03/2020   PFIZER(Purple Top)SARS-COV-2 Vaccination 09/13/2019, 10/06/2019, 05/09/2020   PPD Test 01/02/2014, 01/21/2015   Pneumococcal Conjugate-13 12/18/2018   Pneumococcal Polysaccharide-23 01/27/2020   Pneumococcal-Unspecified 08/23/1992   Tdap 08/19/2012   Preventative care: Last colonoscopy: had in 2004, declines further Cologuard: 03/25/2021 negative  CXR: 2018, declines today  Former smoker - 37.5 pack year history quit in 2000  Prior vaccinations: TD or Tdap: 2014  Influenza: 04/2020, TODAY *** Pneumococcal: 1994, 2021 Prevnar13: 11/2018 Shingles/Zostavax: declines, reports had zostavax Covid 19: 2/2, 2021, booster 04/2020  Names of Other Physician/Practitioners you currently use: 1. [facility] here for primary care 2. [facility], Dr. Lorin Picket, eye doctor, last visit 2021, goes 3-4 times a year, monitoring glaucoma/IOP 3. Dr. Dimas Millin, dentist, last visit 2021, q44m  Patient Care Team: Lucky Cowboy, MD as PCP - General (Internal Medicine) Sharrell Ku, MD as Consulting Physician (Gastroenterology)  Surgical: He  has a past surgical history that includes LEFT HEART CATH AND CORONARY ANGIOGRAPHY (N/A, 04/11/2017). Family His family history includes Healthy in his son; Heart disease in his brother, father, and paternal grandmother; Hypertension in his  brother; Liver cancer in his mother; Stroke in his paternal grandfather and paternal grandmother. Social history  He reports that he quit smoking about 22 years ago. He has a 37.50 pack-year smoking history. He has never used smokeless tobacco. He reports current alcohol use of about 7.0 standard drinks per week. He reports that he does not use drugs.  MEDICARE WELLNESS OBJECTIVES: Physical activity:   Cardiac risk factors:   Depression/mood screen:   Depression screen [facility] 2/9 08/09/2020  Decreased Interest 0  Down, Depressed, Hopeless 0  PHQ - 2 Score 0  Altered sleeping -  Tired, decreased energy -  Change in appetite -  Feeling bad or failure about yourself  -  Trouble concentrating -  Moving slowly or fidgety/restless -  Suicidal thoughts -  PHQ-9 Score -  Difficult doing work/chores -    ADLs:  In your present state of health, do you have any difficulty performing the following activities: 08/09/2020  Hearing? N  Vision? N  Difficulty concentrating or making decisions? N  Walking or climbing stairs? N  Dressing or bathing? N  Doing errands,  shopping? N  Some recent data might be hidden     Cognitive Testing  Alert? Yes  Normal Appearance?Yes  Oriented to person? Yes  Place? Yes   Time? Yes  Recall of three objects?  Yes  Can perform simple calculations? Yes  Displays appropriate judgment?Yes  Can read the correct time from a watch face?Yes  EOL planning:     Objective:   There were no vitals filed for this visit.  There is no height or weight on file to calculate BMI.  General appearance: alert, no distress, WD/WN, male HEENT: normocephalic, sclerae anicteric, TMs pearly, nares patent, no discharge or erythema, pharynx normal Oral cavity: MMM, no lesions Neck: supple, no lymphadenopathy, no thyromegaly, no masses Heart: RRR, normal S1, S2, no murmurs Lungs: CTA bilaterally, no wheezes, rhonchi, or rales Abdomen: +bs, soft, non tender, non distended, no  masses, no hepatomegaly, no splenomegaly Musculoskeletal: nontender, no swelling, no obvious deformity Extremities: no edema, no cyanosis, no clubbing Pulses: 2+ symmetric, upper and lower extremities, normal cap refill Neurological: alert, oriented x 3, CN2-12 intact, strength normal upper extremities and lower extremities, sensation normal throughout, DTRs 2+ throughout, no cerebellar signs, gait normal Psychiatric: normal affect, behavior normal, pleasant   Medicare Attestation I have personally reviewed: The patient's medical and social history Their use of alcohol, tobacco or illicit drugs Their current medications and supplements The patient's functional ability including ADLs,fall risks, home safety risks, cognitive, and hearing and visual impairment Diet and physical activities Evidence for depression or mood disorders  The patient's weight, height, BMI, and visual acuity have been recorded in the chart.  I have made referrals, counseling, and provided education to the patient based on review of the above and I have provided the patient with a written personalized care plan for preventive services.     Dan Maker, NP   05/25/2021

## 2021-05-26 ENCOUNTER — Ambulatory Visit: Payer: Medicare Other | Admitting: Adult Health

## 2021-05-31 ENCOUNTER — Other Ambulatory Visit: Payer: Self-pay | Admitting: Internal Medicine

## 2021-05-31 MED ORDER — TADALAFIL 20 MG PO TABS: ORAL_TABLET | ORAL | 3 refills | Status: DC

## 2021-06-29 ENCOUNTER — Other Ambulatory Visit (HOSPITAL_COMMUNITY): Payer: Self-pay

## 2021-08-02 ENCOUNTER — Other Ambulatory Visit (HOSPITAL_COMMUNITY): Payer: Self-pay

## 2021-08-03 ENCOUNTER — Other Ambulatory Visit: Payer: Self-pay | Admitting: Internal Medicine

## 2021-08-03 ENCOUNTER — Other Ambulatory Visit: Payer: Self-pay | Admitting: Adult Health

## 2021-08-03 DIAGNOSIS — I10 Essential (primary) hypertension: Secondary | ICD-10-CM

## 2021-08-04 ENCOUNTER — Encounter: Payer: Self-pay | Admitting: Internal Medicine

## 2021-08-04 ENCOUNTER — Other Ambulatory Visit: Payer: Self-pay | Admitting: Internal Medicine

## 2021-08-04 DIAGNOSIS — I10 Essential (primary) hypertension: Secondary | ICD-10-CM

## 2021-08-04 DIAGNOSIS — Z20822 Contact with and (suspected) exposure to covid-19: Secondary | ICD-10-CM | POA: Diagnosis not present

## 2021-08-04 MED ORDER — LOSARTAN POTASSIUM 50 MG PO TABS: ORAL_TABLET | ORAL | 3 refills | Status: DC

## 2021-09-05 DIAGNOSIS — H2513 Age-related nuclear cataract, bilateral: Secondary | ICD-10-CM | POA: Diagnosis not present

## 2021-10-24 ENCOUNTER — Other Ambulatory Visit: Payer: Self-pay | Admitting: Internal Medicine

## 2021-10-26 ENCOUNTER — Other Ambulatory Visit: Payer: Self-pay | Admitting: Internal Medicine

## 2021-11-15 ENCOUNTER — Encounter: Payer: Self-pay | Admitting: Internal Medicine

## 2021-11-15 ENCOUNTER — Other Ambulatory Visit: Payer: Self-pay | Admitting: Internal Medicine

## 2021-11-15 MED ORDER — FINASTERIDE 5 MG PO TABS: ORAL_TABLET | ORAL | 3 refills | Status: DC

## 2022-02-07 DIAGNOSIS — H40053 Ocular hypertension, bilateral: Secondary | ICD-10-CM | POA: Diagnosis not present

## 2022-03-15 ENCOUNTER — Encounter: Payer: Self-pay | Admitting: Internal Medicine

## 2022-03-15 NOTE — Progress Notes (Unsigned)
Comprehensive Evaluation & Examination  Future Appointments  Date Time Provider Department  03/16/2022                        cpe 11:00 AM Lucky Cowboy, MD [facility]  03/22/2023                        cpe 11:00 AM Lucky Cowboy, MD [facility]              This very nice 71 y.o. MWM presents for a  comprehensive evaluation and management of multiple medical co-morbidities.  Patient has been followed for HTN, HLD, Prediabetes and Vitamin D Deficiency. CXR in 2018 showed Aortic Atherosclerosis. Patient also has hx of gout (off meds).       HTN predates circa  1994. Patient's BP has been controlled and today's BP is at goal - 136/72 .  Heart cath in 2018 found  insignificant CAD.   Patient denies any cardiac symptoms as chest pain, palpitations, shortness of breath, dizziness or ankle swelling.       Patient' relates that he is now off lipitor & only on a Vegan diet. Last lipids were at goal :             Lab Results  Component Value Date   CHOL 174 03/16/2021   HDL 58 03/16/2021   LDLCALC 95 03/16/2021   TRIG 117 03/16/2021   CHOLHDL 3.0 03/16/2021                                    Patient has hx/o Testosterone Deficiency & has been on Testosterone inj  since 2015  with improved stamina & sense of well being.  Patient also has hx/o prediabetes (A1c 5.7% /2012) and patient denies reactive hypoglycemic symptoms, visual blurring, diabetic polys or paresthesias. Last A1c was normal & at goal :    Lab Results  Component Value Date   HGBA1C 5.1 03/16/2021        Finally, patient has history of Vitamin D Deficiency ("14" /2008) and last vitamin D was at goal :   Lab Results  Component Value Date   VD25OH 88 03/16/2021        Current Outpatient Medications:    Ascorbic Acid (VITAMIN C) 1000 MG tablet, Take 1,000 mg by mouth daily., Disp: , Rfl:    aspirin 81 MG tablet, Take 81 mg by mouth daily., Disp: , Rfl:    atenolol (TENORMIN) 25 MG tablet, TAKE 1 TABLET EVERY  DAY , Disp: 90 tablet, Rfl: 3     Cholecalciferol (VITAMIN D PO), Take 8,000 Units   daily. , Disp: , Rfl:    citalopram (CELEXA) 20 MG tablet, Take  1 tablet  Daily   COVID-19 mRNA bivalent vaccine, Moderna, (MODERNA COVID-19 BIVAL BOOSTER) 50 MCG/0.5ML injection, Inject into the muscle., Disp: 0.5 mL, Rfl: 0   finasteride (PROSCAR) 5 MG tablet, Take  Daily  as Directed, Disp: 90 tablet, Rfl: 3   latanoprost (XALATAN) 0.005 % ophthalmic solution, Place 1 drop into both eyes at bedtime., Disp: , Rfl:    losartan (COZAAR) 50 MG tablet, Take 1 tablet Daily for BP, Disp: 90 tablet, Rfl: 3   meloxicam (MOBIC) 7.5 MG tablet, Take 1 tablet (7.5 mg total) by mouth daily., Disp: 30 tablet, Rfl: 0   Multiple Vitamins-Minerals (MULTIVITAMIN PO), Take  1 tablet by mouth daily. , Disp: , Rfl:    tadalafil (CIALIS) 20 MG tablet, Take 1/2 to 1 tablet every 2 to 3 days as needed for XXXX                               /          TAKE 1/2 TO 1 TABLET BY MOUTH, Disp: 30 tablet, Rfl: 3   tamsulosin (FLOMAX) 0.4 MG CAPS capsule, Take  2 capsules  at Bedtime  for Prostate, Disp: 180 capsule, Rfl: 3   testosterone cypionate (DEPOTESTOSTERONE CYPIONATE) 200 MG/ML injection, INJECT 2 ML INTO MUSCLE EVERY 2 WEEKS, Disp: 10 mL, Rfl: 2   timolol (BETIMOL) 0.5 % ophthalmic solution, Place 1 drop into both eyes in the morning, at noon, and at bedtime. , Disp: , Rfl:     Allergies  Allergen Reactions   Ace Inhibitors     cough   Synthetic opioids Itching     Full Body itching     Past Medical History:  Diagnosis Date   Anxiety    Cervical stenosis of spine    Depression    GERD (gastroesophageal reflux disease)    Glaucoma    Gout    Hyperlipidemia    Hypertension    Hypogonadism male    Prediabetes    Vitamin D deficiency      Health Maintenance  Topic Date Due   Zoster Vaccines- Shingrix (1 of 2) Never done   COVID-19 Vaccine (5 - Booster for Pfizer series) 03/05/2021   INFLUENZA VACCINE  02/28/2021    Fecal DNA (Cologuard)  03/22/2021   TETANUS/TDAP  08/19/2022   Hepatitis C Screening  Completed   PNA vac Low Risk Adult  Completed   HPV VACCINES  Aged Out     Immunization History  Administered Date(s) Administered   Influenza, High Dose  09/02/2018, 05/06/2020   Influenza 05/15/2019   PFIZER  Covid-19 Tri-Sucrose Vacc 11/03/2020   PFIZER SARS-COV-2 Vacc 09/13/2019, 10/06/2019, 05/09/2020   PPD Test 01/02/2014, 01/21/2015   Pneumococcal - 13 12/18/2018   Pneumococcal - 23 01/27/2020   Pneumococcal - 23 08/23/1992   Tdap 08/19/2012    03/22/2018  - Cologard - Negative - 3 year f/u due now.  Past Surgical History:  Procedure Laterality Date   LEFT HEART CATH AND CORONARY ANGIOGRAPHY - Lyn Records, MD; N/A 04/11/2017     Family History  Problem Relation   Liver cancer Mother   Heart disease Father   Heart disease Brother   Hypertension Brother   Heart disease Paternal Grandmother   Stroke Paternal Grandmother   Stroke Paternal Grandfather   Healthy Son    Social History   Socioeconomic History   Marital status: Married    Spouse name: Vickie   Number of children: 1 son & 1 daughter   Occupational History   Retired  Tobacco Use   Smoking status: Former    Packs/day: 1.50    Years: 25.00    Pack years: 37.50    Types: Cigarettes    Quit date: 07/31/1998    Years since quitting: 22.6   Smokeless tobacco: Never  Substance and Sexual Activity   Alcohol use: Yes    Alcohol/week: 7.0 standard drinks    Types: 7 Standard drinks or equivalent per week   Drug use: No   Sexual activity: Not on file  ROS Constitutional: Denies fever, chills, weight loss/gain, headaches, insomnia,  night sweats or change in appetite. Does c/o fatigue. Eyes: Denies redness, blurred vision, diplopia, discharge, itchy or watery eyes.  ENT: Denies discharge, congestion, post nasal drip, epistaxis, sore throat, earache, hearing loss, dental pain, Tinnitus, Vertigo, Sinus pain  or snoring.  Cardio: Denies chest pain, palpitations, irregular heartbeat, syncope, dyspnea, diaphoresis, orthopnea, PND, claudication or edema Respiratory: denies cough, dyspnea, DOE, pleurisy, hoarseness, laryngitis or wheezing.  Gastrointestinal: Denies dysphagia, heartburn, reflux, water brash, pain, cramps, nausea, vomiting, bloating, diarrhea, constipation, hematemesis, melena, hematochezia, jaundice or hemorrhoids Genitourinary: Denies dysuria, frequency, urgency, nocturia, hesitancy, discharge, hematuria or flank pain Musculoskeletal: Denies arthralgia, myalgia, stiffness, Jt. Swelling, pain, limp or strain/sprain. Denies Falls. Skin: Denies puritis, rash, hives, warts, acne, eczema or change in skin lesion Neuro: No weakness, tremor, incoordination, spasms, paresthesia or pain Psychiatric: Denies confusion, memory loss or sensory loss. Denies Depression. Endocrine: Denies change in weight, skin, hair change, nocturia, and paresthesia, diabetic polys, visual blurring or hyper / hypo glycemic episodes.  Heme/Lymph: No excessive bleeding, bruising or enlarged lymph nodes.   Physical Exam  BP 136/72   Pulse (!) 53   Temp 97.9 F (36.6 C)   Resp 16   Ht 6\' 1"  (1.854 m)   Wt 197 lb 3.2 oz (89.4 kg)   SpO2 97%   BMI 26.02 kg/m   General Appearance: Well nourished and well groomed and in no apparent distress.  Eyes: PERRLA, EOMs, conjunctiva no swelling or erythema, normal fundi and vessels. Sinuses: No frontal/maxillary tenderness ENT/Mouth: EACs patent / TMs  nl. Nares clear without erythema, swelling, mucoid exudates. Oral hygiene is good. No erythema, swelling, or exudate. Tongue normal, non-obstructing. Tonsils not swollen or erythematous. Hearing normal.  Neck: Supple, thyroid not palpable. No bruits, nodes or JVD. Respiratory: Respiratory effort normal.  BS equal and clear bilateral without rales, rhonci, wheezing or stridor. Cardio: Heart sounds are normal with regular rate  and rhythm and no murmurs, rubs or gallops. Peripheral pulses are normal and equal bilaterally without edema. No aortic or femoral bruits. Chest: symmetric with normal excursions and percussion.  Abdomen: Soft, with Nl bowel sounds. Nontender, no guarding, rebound, hernias, masses, or organomegaly.  Lymphatics: Non tender without lymphadenopathy.  Musculoskeletal: Full ROM all peripheral extremities, joint stability, 5/5 strength, and normal gait. Skin: Warm and dry without rashes, lesions, cyanosis, clubbing or  ecchymosis.  Neuro: Cranial nerves intact, reflexes equal bilaterally. Normal muscle tone, no cerebellar symptoms. Sensation intact.  Pysch: Alert and oriented X 3 with normal affect, insight and judgment appropriate.   Assessment and Plan                1. Essential hypertension  - EKG 12-Lead - Urinalysis, Routine w reflex microscopic - Microalbumin / creatinine urine ratio - CBC with Differential/Platelet - COMPLETE METABOLIC PANEL WITH GFR - Magnesium - TSH  2. Hyperlipidemia, mixed  - EKG 12-Lead - Lipid panel - TSH  3. Abnormal glucose  - EKG 12-Lead - Hemoglobin A1c - Insulin, random  4. Vitamin D deficiency  - VITAMIN D 25 Hydroxy   5. Testosterone Deficiency  - Testosterone - Lipid panel  6. Aortic atherosclerosis (HCC)  - EKG 12-Lead  7. BPH with obstruction/lower urinary tract symptoms  - PSA  8. Idiopathic gout  - Uric acid  9. Prostate cancer screening  - PSA  10. Screening for colorectal cancer  - POC Hemoccult Bld/Stl   11. Screening for heart disease  -  EKG 12-Lead  12. FHx: heart disease  - EKG 12-Lead  13. Former smoker  - EKG 12-Lead  14. Screening for AAA (aortic abdominal aneurysm)   15. Medication management  - Urinalysis, Routine w reflex microscopic - Microalbumin / creatinine urine ratio - Testosterone - Uric acid - CBC with Differential/Platelet - COMPLETE METABOLIC PANEL WITH GFR - Magnesium -  Lipid panel - TSH - Hemoglobin A1c - Insulin, random - VITAMIN D 25 Hydroxy         Patient was counseled in prudent diet, weight control to achieve/maintain BMI less than 25, BP monitoring, regular exercise and medications as discussed.  Discussed med effects and SE's. Routine screening labs and tests as requested with regular follow-up as recommended. Over 40 minutes of exam, counseling, chart review and high complex critical decision making was performed   Marinus Maw, MD

## 2022-03-15 NOTE — Patient Instructions (Signed)
Preventive Care for Adults A healthy lifestyle and preventive care can promote health and wellness. Preventive health guidelines for men include the following key practices:  A routine yearly physical is a good way to check with your health care provider about your health and preventative screening. It is a chance to share any concerns and updates on your health and to receive a thorough exam.  Visit your dentist for a routine exam and preventative care every 6 months. Brush your teeth twice a day and floss once a day. Good oral hygiene prevents tooth decay and gum disease.  The frequency of eye exams is based on your age, health, family medical history, use of contact lenses, and other factors. Follow your health care provider's recommendations for frequency of eye exams.  Eat a healthy diet. Foods such as vegetables, fruits, whole grains, low-fat dairy products, and lean protein foods contain the nutrients you need without too many calories. Decrease your intake of foods high in solid fats, added sugars, and salt. Eat the right amount of calories for you.Get information about a proper diet from your health care provider, if necessary.  Regular physical exercise is one of the most important things you can do for your health. Most adults should get at least 150 minutes of moderate-intensity exercise (any activity that increases your heart rate and causes you to sweat) each week. In addition, most adults need muscle-strengthening exercises on 2 or more days a week.  Maintain a healthy weight. The body mass index (BMI) is a screening tool to identify possible weight problems. It provides an estimate of body fat based on height and weight. Your health care provider can find your BMI and can help you achieve or maintain a healthy weight.For adults 20 years and older:  A BMI below 18.5 is considered underweight.  A BMI of 18.5 to 24.9 is normal.  A BMI of 25 to 29.9 is considered overweight.  A BMI  of 30 and above is considered obese.  Maintain normal blood lipids and cholesterol levels by exercising and minimizing your intake of saturated fat. Eat a balanced diet with plenty of fruit and vegetables. Blood tests for lipids and cholesterol should begin at age 20 and be repeated every 5 years. If your lipid or cholesterol levels are high, you are over 50, or you are at high risk for heart disease, you may need your cholesterol levels checked more frequently.Ongoing high lipid and cholesterol levels should be treated with medicines if diet and exercise are not working.  If you smoke, find out from your health care provider how to quit. If you do not use tobacco, do not start.  Lung cancer screening is recommended for adults aged 55-80 years who are at high risk for developing lung cancer because of a history of smoking. A yearly low-dose CT scan of the lungs is recommended for people who have at least a 30-pack-year history of smoking and are a current smoker or have quit within the past 15 years. A pack year of smoking is smoking an average of 1 pack of cigarettes a day for 1 year (for example: 1 pack a day for 30 years or 2 packs a day for 15 years). Yearly screening should continue until the smoker has stopped smoking for at least 15 years. Yearly screening should be stopped for people who develop a health problem that would prevent them from having lung cancer treatment.  If you choose to drink alcohol, do not have more than   2 drinks per day. One drink is considered to be 12 ounces (355 mL) of beer, 5 ounces (148 mL) of wine, or 1.5 ounces (44 mL) of liquor.  Avoid use of street drugs. Do not share needles with anyone. Ask for help if you need support or instructions about stopping the use of drugs.  High blood pressure causes heart disease and increases the risk of stroke. Your blood pressure should be checked at least every 1-2 years. Ongoing high blood pressure should be treated with  medicines, if weight loss and exercise are not effective.  If you are 45-79 years old, ask your health care provider if you should take aspirin to prevent heart disease.  Diabetes screening involves taking a blood sample to check your fasting blood sugar level. This should be done once every 3 years, after age 45, if you are within normal weight and without risk factors for diabetes. Testing should be considered at a younger age or be carried out more frequently if you are overweight and have at least 1 risk factor for diabetes.  Colorectal cancer can be detected and often prevented. Most routine colorectal cancer screening begins at the age of 50 and continues through age 75. However, your health care provider may recommend screening at an earlier age if you have risk factors for colon cancer. On a yearly basis, your health care provider may provide home test kits to check for hidden blood in the stool. Use of a small camera at the end of a tube to directly examine the colon (sigmoidoscopy or colonoscopy) can detect the earliest forms of colorectal cancer. Talk to your health care provider about this at age 50, when routine screening begins. Direct exam of the colon should be repeated every 5-10 years through age 75, unless early forms of precancerous polyps or small growths are found.  People who are at an increased risk for hepatitis B should be screened for this virus. You are considered at high risk for hepatitis B if:  You were born in a country where hepatitis B occurs often. Talk with your health care provider about which countries are considered high risk.  Your parents were born in a high-risk country and you have not received a shot to protect against hepatitis B (hepatitis B vaccine).  You have HIV or AIDS.  You use needles to inject street drugs.  You live with, or have sex with, someone who has hepatitis B.  You are a man who has sex with other men (MSM).  You get hemodialysis  treatment.  You take certain medicines for conditions such as cancer, organ transplantation, and autoimmune conditions.  Hepatitis C blood testing is recommended for all people born from 1945 through 1965 and any individual with known risks for hepatitis C.  Practice safe sex. Use condoms and avoid high-risk sexual practices to reduce the spread of sexually transmitted infections (STIs). STIs include gonorrhea, chlamydia, syphilis, trichomonas, herpes, HPV, and human immunodeficiency virus (HIV). Herpes, HIV, and HPV are viral illnesses that have no cure. They can result in disability, cancer, and death.  If you are at risk of being infected with HIV, it is recommended that you take a prescription medicine daily to prevent HIV infection. This is called preexposure prophylaxis (PrEP). You are considered at risk if:  You are a man who has sex with other men (MSM) and have other risk factors.  You are a heterosexual man, are sexually active, and are at increased risk for HIV infection.    You take drugs by injection.  You are sexually active with a partner who has HIV.  Talk with your health care provider about whether you are at high risk of being infected with HIV. If you choose to begin PrEP, you should first be tested for HIV. You should then be tested every 3 months for as long as you are taking PrEP.  A one-time screening for abdominal aortic aneurysm (AAA) and surgical repair of large AAAs by ultrasound are recommended for men ages 65 to 75 years who are current or former smokers.  Healthy men should no longer receive prostate-specific antigen (PSA) blood tests as part of routine cancer screening. Talk with your health care provider about prostate cancer screening.  Testicular cancer screening is not recommended for adult males who have no symptoms. Screening includes self-exam, a health care provider exam, and other screening tests. Consult with your health care provider about any symptoms  you have or any concerns you have about testicular cancer.  Use sunscreen. Apply sunscreen liberally and repeatedly throughout the day. You should seek shade when your shadow is shorter than you. Protect yourself by wearing long sleeves, pants, a wide-brimmed hat, and sunglasses year round, whenever you are outdoors.  Once a month, do a whole-body skin exam, using a mirror to look at the skin on your back. Tell your health care provider about new moles, moles that have irregular borders, moles that are larger than a pencil eraser, or moles that have changed in shape or color.  Stay current with required vaccines (immunizations).  Influenza vaccine. All adults should be immunized every year.  Tetanus, diphtheria, and acellular pertussis (Td, Tdap) vaccine. An adult who has not previously received Tdap or who does not know his vaccine status should receive 1 dose of Tdap. This initial dose should be followed by tetanus and diphtheria toxoids (Td) booster doses every 10 years. Adults with an unknown or incomplete history of completing a 3-dose immunization series with Td-containing vaccines should begin or complete a primary immunization series including a Tdap dose. Adults should receive a Td booster every 10 years.  Varicella vaccine. An adult without evidence of immunity to varicella should receive 2 doses or a second dose if he has previously received 1 dose.  Human papillomavirus (HPV) vaccine. Males aged 13-21 years who have not received the vaccine previously should receive the 3-dose series. Males aged 22-26 years may be immunized. Immunization is recommended through the age of 26 years for any male who has sex with males and did not get any or all doses earlier. Immunization is recommended for any person with an immunocompromised condition through the age of 26 years if he did not get any or all doses earlier. During the 3-dose series, the second dose should be obtained 4-8 weeks after the first  dose. The third dose should be obtained 24 weeks after the first dose and 16 weeks after the second dose.  Zoster vaccine. One dose is recommended for adults aged 60 years or older unless certain conditions are present.  Measles, mumps, and rubella (MMR) vaccine. Adults born before 1957 generally are considered immune to measles and mumps. Adults born in 1957 or later should have 1 or more doses of MMR vaccine unless there is a contraindication to the vaccine or there is laboratory evidence of immunity to each of the three diseases. A routine second dose of MMR vaccine should be obtained at least 28 days after the first dose for students attending postsecondary   schools, health care workers, or international travelers. People who received inactivated measles vaccine or an unknown type of measles vaccine during 1963-1967 should receive 2 doses of MMR vaccine. People who received inactivated mumps vaccine or an unknown type of mumps vaccine before 1979 and are at high risk for mumps infection should consider immunization with 2 doses of MMR vaccine. Unvaccinated health care workers born before 1957 who lack laboratory evidence of measles, mumps, or rubella immunity or laboratory confirmation of disease should consider measles and mumps immunization with 2 doses of MMR vaccine or rubella immunization with 1 dose of MMR vaccine.  Pneumococcal 13-valent conjugate (PCV13) vaccine. When indicated, a person who is uncertain of his immunization history and has no record of immunization should receive the PCV13 vaccine. An adult aged 19 years or older who has certain medical conditions and has not been previously immunized should receive 1 dose of PCV13 vaccine. This PCV13 should be followed with a dose of pneumococcal polysaccharide (PPSV23) vaccine. The PPSV23 vaccine dose should be obtained at least 8 weeks after the dose of PCV13 vaccine. An adult aged 19 years or older who has certain medical conditions and  previously received 1 or more doses of PPSV23 vaccine should receive 1 dose of PCV13. The PCV13 vaccine dose should be obtained 1 or more years after the last PPSV23 vaccine dose.  Pneumococcal polysaccharide (PPSV23) vaccine. When PCV13 is also indicated, PCV13 should be obtained first. All adults aged 65 years and older should be immunized. An adult younger than age 65 years who has certain medical conditions should be immunized. Any person who resides in a nursing home or long-term care facility should be immunized. An adult smoker should be immunized. People with an immunocompromised condition and certain other conditions should receive both PCV13 and PPSV23 vaccines. People with human immunodeficiency virus (HIV) infection should be immunized as soon as possible after diagnosis. Immunization during chemotherapy or radiation therapy should be avoided. Routine use of PPSV23 vaccine is not recommended for American Indians, [place] Natives, or people younger than 65 years unless there are medical conditions that require PPSV23 vaccine. When indicated, people who have unknown immunization and have no record of immunization should receive PPSV23 vaccine. One-time revaccination 5 years after the first dose of PPSV23 is recommended for people aged 19-64 years who have chronic kidney failure, nephrotic syndrome, asplenia, or immunocompromised conditions. People who received 1-2 doses of PPSV23 before age 65 years should receive another dose of PPSV23 vaccine at age 65 years or later if at least 5 years have passed since the previous dose. Doses of PPSV23 are not needed for people immunized with PPSV23 at or after age 65 years.  Meningococcal vaccine. Adults with asplenia or persistent complement component deficiencies should receive 2 doses of quadrivalent meningococcal conjugate (MenACWY-D) vaccine. The doses should be obtained at least 2 months apart. Microbiologists working with certain meningococcal bacteria,  military recruits, people at risk during an outbreak, and people who travel to or live in countries with a high rate of meningitis should be immunized. A first-year college student up through age 21 years who is living in a residence hall should receive a dose if he did not receive a dose on or after his 16th birthday. Adults who have certain high-risk conditions should receive one or more doses of vaccine.  Hepatitis A vaccine. Adults who wish to be protected from this disease, have certain high-risk conditions, work with hepatitis A-infected animals, work in hepatitis A research labs, or   travel to or work in countries with a high rate of hepatitis A should be immunized. Adults who were previously unvaccinated and who anticipate close contact with an international adoptee during the first 60 days after arrival in the [place] from a country with a high rate of hepatitis A should be immunized.  Hepatitis B vaccine. Adults should be immunized if they wish to be protected from this disease, have certain high-risk conditions, may be exposed to blood or other infectious body fluids, are household contacts or sex partners of hepatitis B positive people, are clients or workers in certain care facilities, or travel to or work in countries with a high rate of hepatitis B.  Haemophilus influenzae type b (Hib) vaccine. A previously unvaccinated person with asplenia or sickle cell disease or having a scheduled splenectomy should receive 1 dose of Hib vaccine. Regardless of previous immunization, a recipient of a hematopoietic stem cell transplant should receive a 3-dose series 6-12 months after his successful transplant. Hib vaccine is not recommended for adults with HIV infection. Preventive Service / Frequency Ages 19 to 39  Blood pressure check.** / Every 1 to 2 years.  Lipid and cholesterol check.** / Every 5 years beginning at age 20.  Hepatitis C blood test.** / For any individual with known risks for  hepatitis C.  Skin self-exam. / Monthly.  Influenza vaccine. / Every year.  Tetanus, diphtheria, and acellular pertussis (Tdap, Td) vaccine.** / Consult your health care provider. 1 dose of Td every 10 years.  Varicella vaccine.** / Consult your health care provider.  HPV vaccine. / 3 doses over 6 months, if 26 or younger.  Measles, mumps, rubella (MMR) vaccine.** / You need at least 1 dose of MMR if you were born in 1957 or later. You may also need a second dose.  Pneumococcal 13-valent conjugate (PCV13) vaccine.** / Consult your health care provider.  Pneumococcal polysaccharide (PPSV23) vaccine.** / 1 to 2 doses if you smoke cigarettes or if you have certain conditions.  Meningococcal vaccine.** / 1 dose if you are age 19 to 21 years and a first-year college student living in a residence hall, or have one of several medical conditions. You may also need additional booster doses.  Hepatitis A vaccine.** / Consult your health care provider.  Hepatitis B vaccine.** / Consult your health care provider.  Haemophilus influenzae type b (Hib) vaccine.** / Consult your health care provider. Ages 40 to 64  Blood pressure check.** / Every 1 to 2 years.  Lipid and cholesterol check.** / Every 5 years beginning at age 20.  Lung cancer screening. / Every year if you are aged 55-80 years and have a 30-pack-year history of smoking and currently smoke or have quit within the past 15 years. Yearly screening is stopped once you have quit smoking for at least 15 years or develop a health problem that would prevent you from having lung cancer treatment.  Fecal occult blood test (FOBT) of stool. / Every year beginning at age 50 and continuing until age 75. You may not have to do this test if you get a colonoscopy every 10 years.  Flexible sigmoidoscopy** or colonoscopy.** / Every 5 years for a flexible sigmoidoscopy or every 10 years for a colonoscopy beginning at age 50 and continuing until age  75.  Hepatitis C blood test.** / For all people born from 1945 through 1965 and any individual with known risks for hepatitis C.  Skin self-exam. / Monthly.  Influenza vaccine. / Every   year.  Tetanus, diphtheria, and acellular pertussis (Tdap/Td) vaccine.** / Consult your health care provider. 1 dose of Td every 10 years.  Varicella vaccine.** / Consult your health care provider.  Zoster vaccine.** / 1 dose for adults aged 60 years or older.  Measles, mumps, rubella (MMR) vaccine.** / You need at least 1 dose of MMR if you were born in 1957 or later. You may also need a second dose.  Pneumococcal 13-valent conjugate (PCV13) vaccine.** / Consult your health care provider.  Pneumococcal polysaccharide (PPSV23) vaccine.** / 1 to 2 doses if you smoke cigarettes or if you have certain conditions.  Meningococcal vaccine.** / Consult your health care provider.  Hepatitis A vaccine.** / Consult your health care provider.  Hepatitis B vaccine.** / Consult your health care provider.  Haemophilus influenzae type b (Hib) vaccine.** / Consult your health care provider. Ages 65 and over  Blood pressure check.** / Every 1 to 2 years.  Lipid and cholesterol check.**/ Every 5 years beginning at age 20.  Lung cancer screening. / Every year if you are aged 55-80 years and have a 30-pack-year history of smoking and currently smoke or have quit within the past 15 years. Yearly screening is stopped once you have quit smoking for at least 15 years or develop a health problem that would prevent you from having lung cancer treatment.  Fecal occult blood test (FOBT) of stool. / Every year beginning at age 50 and continuing until age 75. You may not have to do this test if you get a colonoscopy every 10 years.  Flexible sigmoidoscopy** or colonoscopy.** / Every 5 years for a flexible sigmoidoscopy or every 10 years for a colonoscopy beginning at age 50 and continuing until age 75.  Hepatitis C blood  test.** / For all people born from 1945 through 1965 and any individual with known risks for hepatitis C.  Abdominal aortic aneurysm (AAA) screening./ Screening current or former smokers or have Hypertension.  Skin self-exam. / Monthly.  Influenza vaccine. / Every year.  Tetanus, diphtheria, and acellular pertussis (Tdap/Td) vaccine.** / 1 dose of Td every 10 years.  Varicella vaccine.** / Consult your health care provider.  Zoster vaccine.** / 1 dose for adults aged 60 years or older.  Pneumococcal 13-valent conjugate (PCV13) vaccine.** / Consult your health care provider.  Pneumococcal polysaccharide (PPSV23) vaccine.** / 1 dose for all adults aged 65 years and older.  Meningococcal vaccine.** / Consult your health care provider.  Hepatitis A vaccine.** / Consult your health care provider.  Hepatitis B vaccine.** / Consult your health care provider.  Haemophilus influenzae type b (Hib) vaccine.** / Consult your health care provider.  Health Maintenance A healthy lifestyle and preventative care can promote health and wellness. Maintain regular health, dental, and eye exams. Eat a healthy diet. Foods like vegetables, fruits, whole grains, low-fat dairy products, and lean protein foods contain the nutrients you need and are low in calories. Decrease your intake of foods high in solid fats, added sugars, and salt. Get information about a proper diet from your health care provider, if necessary. Regular physical exercise is one of the most important things you can do for your health. Most adults should get at least 150 minutes of moderate-intensity exercise (any activity that increases your heart rate and causes you to sweat) each week. In addition, most adults need muscle-strengthening exercises on 2 or more days a week.  Maintain a healthy weight. The body mass index (BMI) is a screening tool to identify   possible weight problems. It provides an estimate of body fat based on height and  weight. Your health care provider can find your BMI and can help you achieve or maintain a healthy weight. For males 20 years and older: A BMI below 18.5 is considered underweight. A BMI of 18.5 to 24.9 is normal. A BMI of 25 to 29.9 is considered overweight. A BMI of 30 and above is considered obese. Maintain normal blood lipids and cholesterol by exercising and minimizing your intake of saturated fat. Eat a balanced diet with plenty of fruits and vegetables. Blood tests for lipids and cholesterol should begin at age 20 and be repeated every 5 years. If your lipid or cholesterol levels are high, you are over age 50, or you are at high risk for heart disease, you may need your cholesterol levels checked more frequently.Ongoing high lipid and cholesterol levels should be treated with medicines if diet and exercise are not working. If you smoke, find out from your health care provider how to quit. If you do not use tobacco, do not start. Lung cancer screening is recommended for adults aged 55-80 years who are at high risk for developing lung cancer because of a history of smoking. A yearly low-dose CT scan of the lungs is recommended for people who have at least a 30-pack-year history of smoking and are current smokers or have quit within the past 15 years. A pack year of smoking is smoking an average of 1 pack of cigarettes a day for 1 year (for example, a 30-pack-year history of smoking could mean smoking 1 pack a day for 30 years or 2 packs a day for 15 years). Yearly screening should continue until the smoker has stopped smoking for at least 15 years. Yearly screening should be stopped for people who develop a health problem that would prevent them from having lung cancer treatment. If you choose to drink alcohol, do not have more than 2 drinks per day. One drink is considered to be 12 oz (360 mL) of beer, 5 oz (150 mL) of wine, or 1.5 oz (45 mL) of liquor. Avoid the use of street drugs. Do not share  needles with anyone. Ask for help if you need support or instructions about stopping the use of drugs. High blood pressure causes heart disease and increases the risk of stroke. Blood pressure should be checked at least every 1-2 years. Ongoing high blood pressure should be treated with medicines if weight loss and exercise are not effective. If you are 45-79 years old, ask your health care provider if you should take aspirin to prevent heart disease. Diabetes screening involves taking a blood sample to check your fasting blood sugar level. This should be done once every 3 years after age 45 if you are at a normal weight and without risk factors for diabetes. Testing should be considered at a younger age or be carried out more frequently if you are overweight and have at least 1 risk factor for diabetes. Colorectal cancer can be detected and often prevented. Most routine colorectal cancer screening begins at the age of 50 and continues through age 75. However, your health care provider may recommend screening at an earlier age if you have risk factors for colon cancer. On a yearly basis, your health care provider may provide home test kits to check for hidden blood in the stool. A small camera at the end of a tube may be used to directly examine the colon (sigmoidoscopy or colonoscopy)   to detect the earliest forms of colorectal cancer. Talk to your health care provider about this at age 50 when routine screening begins. A direct exam of the colon should be repeated every 5-10 years through age 75, unless early forms of precancerous polyps or small growths are found. People who are at an increased risk for hepatitis B should be screened for this virus. You are considered at high risk for hepatitis B if: You were born in a country where hepatitis B occurs often. Talk with your health care provider about which countries are considered high risk. Your parents were born in a high-risk country and you have not  received a shot to protect against hepatitis B (hepatitis B vaccine). You have HIV or AIDS. You use needles to inject street drugs. You live with, or have sex with, someone who has hepatitis B. You are a man who has sex with other men (MSM). You get hemodialysis treatment. You take certain medicines for conditions like cancer, organ transplantation, and autoimmune conditions. Hepatitis C blood testing is recommended for all people born from 1945 through 1965 and any individual with known risk factors for hepatitis C. Healthy men should no longer receive prostate-specific antigen (PSA) blood tests as part of routine cancer screening. Talk to your health care provider about prostate cancer screening. Testicular cancer screening is not recommended for adolescents or adult males who have no symptoms. Screening includes self-exam, a health care provider exam, and other screening tests. Consult with your health care provider about any symptoms you have or any concerns you have about testicular cancer. Practice safe sex. Use condoms and avoid high-risk sexual practices to reduce the spread of sexually transmitted infections (STIs). You should be screened for STIs, including gonorrhea and chlamydia if: You are sexually active and are younger than 24 years. You are older than 24 years, and your health care provider tells you that you are at risk for this type of infection. Your sexual activity has changed since you were last screened, and you are at an increased risk for chlamydia or gonorrhea. Ask your health care provider if you are at risk. If you are at risk of being infected with HIV, it is recommended that you take a prescription medicine daily to prevent HIV infection. This is called pre-exposure prophylaxis (PrEP). You are considered at risk if: You are a man who has sex with other men (MSM). You are a heterosexual man who is sexually active with multiple partners. You take drugs by injection. You  are sexually active with a partner who has HIV. Talk with your health care provider about whether you are at high risk of being infected with HIV. If you choose to begin PrEP, you should first be tested for HIV. You should then be tested every 3 months for as long as you are taking PrEP. Use sunscreen. Apply sunscreen liberally and repeatedly throughout the day. You should seek shade when your shadow is shorter than you. Protect yourself by wearing long sleeves, pants, a wide-brimmed hat, and sunglasses year round whenever you are outdoors. Tell your health care provider of new moles or changes in moles, especially if there is a change in shape or color. Also, tell your health care provider if a mole is larger than the size of a pencil eraser. Stay current with your vaccines (immunizations).   

## 2022-03-16 ENCOUNTER — Encounter: Payer: Self-pay | Admitting: Internal Medicine

## 2022-03-16 ENCOUNTER — Ambulatory Visit (INDEPENDENT_AMBULATORY_CARE_PROVIDER_SITE_OTHER): Payer: Medicare Other | Admitting: Internal Medicine

## 2022-03-16 VITALS — BP 136/72 | HR 53 | Temp 97.9°F | Resp 16 | Ht 73.0 in | Wt 197.2 lb

## 2022-03-16 DIAGNOSIS — Z1211 Encounter for screening for malignant neoplasm of colon: Secondary | ICD-10-CM

## 2022-03-16 DIAGNOSIS — N401 Enlarged prostate with lower urinary tract symptoms: Secondary | ICD-10-CM

## 2022-03-16 DIAGNOSIS — I700 Atherosclerosis of aorta: Secondary | ICD-10-CM

## 2022-03-16 DIAGNOSIS — R7309 Other abnormal glucose: Secondary | ICD-10-CM | POA: Diagnosis not present

## 2022-03-16 DIAGNOSIS — Z79899 Other long term (current) drug therapy: Secondary | ICD-10-CM

## 2022-03-16 DIAGNOSIS — E782 Mixed hyperlipidemia: Secondary | ICD-10-CM

## 2022-03-16 DIAGNOSIS — E291 Testicular hypofunction: Secondary | ICD-10-CM | POA: Diagnosis not present

## 2022-03-16 DIAGNOSIS — N138 Other obstructive and reflux uropathy: Secondary | ICD-10-CM | POA: Diagnosis not present

## 2022-03-16 DIAGNOSIS — Z8249 Family history of ischemic heart disease and other diseases of the circulatory system: Secondary | ICD-10-CM

## 2022-03-16 DIAGNOSIS — M1000 Idiopathic gout, unspecified site: Secondary | ICD-10-CM | POA: Diagnosis not present

## 2022-03-16 DIAGNOSIS — Z87891 Personal history of nicotine dependence: Secondary | ICD-10-CM

## 2022-03-16 DIAGNOSIS — I10 Essential (primary) hypertension: Secondary | ICD-10-CM | POA: Diagnosis not present

## 2022-03-16 DIAGNOSIS — Z125 Encounter for screening for malignant neoplasm of prostate: Secondary | ICD-10-CM | POA: Diagnosis not present

## 2022-03-16 DIAGNOSIS — Z136 Encounter for screening for cardiovascular disorders: Secondary | ICD-10-CM

## 2022-03-16 DIAGNOSIS — Z1212 Encounter for screening for malignant neoplasm of rectum: Secondary | ICD-10-CM

## 2022-03-16 DIAGNOSIS — E559 Vitamin D deficiency, unspecified: Secondary | ICD-10-CM

## 2022-03-17 LAB — CBC WITH DIFFERENTIAL/PLATELET
Absolute Monocytes: 360 cells/uL (ref 200–950)
Basophils Absolute: 29 cells/uL (ref 0–200)
Basophils Relative: 0.8 %
Eosinophils Absolute: 101 cells/uL (ref 15–500)
Eosinophils Relative: 2.8 %
HCT: 41.8 % (ref 38.5–50.0)
Hemoglobin: 14.6 g/dL (ref 13.2–17.1)
Lymphs Abs: 961 cells/uL (ref 850–3900)
MCH: 31.6 pg (ref 27.0–33.0)
MCHC: 34.9 g/dL (ref 32.0–36.0)
MCV: 90.5 fL (ref 80.0–100.0)
MPV: 13.3 fL — ABNORMAL HIGH (ref 7.5–12.5)
Monocytes Relative: 10 %
Neutro Abs: 2149 cells/uL (ref 1500–7800)
Neutrophils Relative %: 59.7 %
Platelets: 118 10*3/uL — ABNORMAL LOW (ref 140–400)
RBC: 4.62 10*6/uL (ref 4.20–5.80)
RDW: 12.7 % (ref 11.0–15.0)
Total Lymphocyte: 26.7 %
WBC: 3.6 10*3/uL — ABNORMAL LOW (ref 3.8–10.8)

## 2022-03-17 LAB — URINALYSIS, ROUTINE W REFLEX MICROSCOPIC
Bilirubin Urine: NEGATIVE
Glucose, UA: NEGATIVE
Hgb urine dipstick: NEGATIVE
Ketones, ur: NEGATIVE
Leukocytes,Ua: NEGATIVE
Nitrite: NEGATIVE
Protein, ur: NEGATIVE
Specific Gravity, Urine: 1.007 (ref 1.001–1.035)
pH: 6 (ref 5.0–8.0)

## 2022-03-17 LAB — COMPLETE METABOLIC PANEL WITH GFR
AG Ratio: 2.4 (calc) (ref 1.0–2.5)
ALT: 15 U/L (ref 9–46)
AST: 14 U/L (ref 10–35)
Albumin: 4.4 g/dL (ref 3.6–5.1)
Alkaline phosphatase (APISO): 47 U/L (ref 35–144)
BUN: 13 mg/dL (ref 7–25)
CO2: 26 mmol/L (ref 20–32)
Calcium: 9.1 mg/dL (ref 8.6–10.3)
Chloride: 104 mmol/L (ref 98–110)
Creat: 0.79 mg/dL (ref 0.70–1.35)
Globulin: 1.8 g/dL (calc) — ABNORMAL LOW (ref 1.9–3.7)
Glucose, Bld: 93 mg/dL (ref 65–99)
Potassium: 4.5 mmol/L (ref 3.5–5.3)
Sodium: 137 mmol/L (ref 135–146)
Total Bilirubin: 0.7 mg/dL (ref 0.2–1.2)
Total Protein: 6.2 g/dL (ref 6.1–8.1)
eGFR: 96 mL/min/{1.73_m2} (ref >=60)

## 2022-03-17 LAB — MICROALBUMIN / CREATININE URINE RATIO
Creatinine, Urine: 34 mg/dL (ref 20–320)
Microalb, Ur: <0.2 mg/dL

## 2022-03-17 LAB — LIPID PANEL
Cholesterol: 190 mg/dL (ref <200)
HDL: 69 mg/dL (ref >=40)
LDL Cholesterol (Calc): 104 mg/dL (calc) — ABNORMAL HIGH
Non-HDL Cholesterol (Calc): 121 mg/dL (calc) (ref <130)
Total CHOL/HDL Ratio: 2.8 (calc) (ref <5.0)
Triglycerides: 80 mg/dL (ref <150)

## 2022-03-17 LAB — URIC ACID: Uric Acid, Serum: 6.2 mg/dL (ref 4.0–8.0)

## 2022-03-17 LAB — TESTOSTERONE: Testosterone: 83 ng/dL — ABNORMAL LOW (ref 250–827)

## 2022-03-17 LAB — HEMOGLOBIN A1C
Hgb A1c MFr Bld: 5 % of total Hgb (ref <5.7)
Mean Plasma Glucose: 97 mg/dL
eAG (mmol/L): 5.4 mmol/L

## 2022-03-17 LAB — INSULIN, RANDOM: Insulin: 7.1 u[IU]/mL

## 2022-03-17 LAB — TSH: TSH: 1.33 mIU/L (ref 0.40–4.50)

## 2022-03-17 LAB — VITAMIN D 25 HYDROXY (VIT D DEFICIENCY, FRACTURES): Vit D, 25-Hydroxy: 87 ng/mL (ref 30–100)

## 2022-03-17 LAB — MAGNESIUM: Magnesium: 2 mg/dL (ref 1.5–2.5)

## 2022-03-17 LAB — PSA: PSA: 0.18 ng/mL (ref <=4.00)

## 2022-03-19 ENCOUNTER — Other Ambulatory Visit: Payer: Self-pay | Admitting: Internal Medicine

## 2022-03-19 NOTE — Progress Notes (Signed)
<><><><><><><><><><><><><><><><><><><><><><><><><><><><><><><><><> <><><><><><><><><><><><><><><><><><><><><><><><><><><><><><><><><> - Test results slightly outside the reference range are not unusual. If there is anything important, I will review this with you,  otherwise it is considered normal test values.  If you have further questions,  please do not hesitate to contact me at the office or via My Chart.  <><><><><><><><><><><><><><><><><><><><><><><><><><><><><><><><><> <><><><><><><><><><><><><><><><><><><><><><><><><><><><><><><><><>  -  Testosterone level low - apparently not taking since last refill was over 6 months ago   & OK to stay of because generally not recommended to prescribe after age 86 <><><><><><><><><><><><><><><><><><><><><><><><><><><><><><><><><> <><><><><><><><><><><><><><><><><><><><><><><><><><><><><><><><><>   -  Total  Chol =   190     - Elevated             (  Ideal  or  Goal is less than 180  !  )  & -  Bad / Dangerous LDL  Chol =  104   - also Elevated              (  Ideal  or  Goal is less than 70  !  )   - Recommend a stricter low cholesterol diet   - Cholesterol only comes from animal sources                                                       - ie. meat, dairy, egg yolks  - Eat all the vegetables you want.  - Avoid Meat, Avoid Meat,  Avoid Meat   !                                  - especially Red Meat - Beef AND Pork .  - Avoid cheese & dairy - milk & ice cream.     - Cheese is the most concentrated form of trans-fats which                                                                    is the worst thing to clog up our arteries.   - Veggie cheese is OK which can be found in the fresh produce section                                                            at [employer] or [employer] or  [employer] <><><><><><><><><><><><><><><><><><><><><><><><><><><><><><><><><> <><><><><><><><><><><><><><><><><><><><><><><><><><><><><><><><><>  - PSA - Low  - Great ! <><><><><><><><><><><><><><><><><><><><><><><><><><><><><><><><><> <><><><><><><><><><><><><><><><><><><><><><><><><><><><><><><><><>  - Uric Acid / Gout test is Normal  <><><><><><><><><><><><><><><><><><><><><><><><><><><><><><><><><> <><><><><><><><><><><><><><><><><><><><><><><><><><><><><><><><><>  - A1c - Normal - No Diabetes  -    Great  ! <><><><><><><><><><><><><><><><><><><><><><><><><><><><><><><><><> <><><><><><><><><><><><><><><><><><><><><><><><><><><><><><><><><>  - Vitamin D = 87 - Excellent  - Please Continue dose same  <><><><><><><><><><><><><><><><><><><><><><><><><><><><><><><><><> <><><><><><><><><><><><><><><><><><><><><><><><><><><><><><><><><>  - All Else - CBC - Kidneys - Electrolytes - Liver - Magnesium & Thyroid    - all  Normal / OK  <><><><><><><><><><><><><><><><><><><><><><><><><><><><><><><><><> <><><><><><><><><><><><><><><><><><><><><><><><><><><><><><><><><>

## 2022-03-23 ENCOUNTER — Other Ambulatory Visit (HOSPITAL_COMMUNITY): Payer: Self-pay

## 2022-04-05 ENCOUNTER — Encounter: Payer: Self-pay | Admitting: Internal Medicine

## 2022-04-05 MED ORDER — MELOXICAM 7.5 MG PO TABS: 7.5000 mg | ORAL_TABLET | Freq: Every day | ORAL | 0 refills | Status: DC

## 2022-04-20 ENCOUNTER — Other Ambulatory Visit (HOSPITAL_COMMUNITY): Payer: Self-pay

## 2022-05-08 ENCOUNTER — Other Ambulatory Visit: Payer: Self-pay | Admitting: Nurse Practitioner

## 2022-05-08 ENCOUNTER — Other Ambulatory Visit: Payer: Self-pay | Admitting: Internal Medicine

## 2022-05-16 ENCOUNTER — Encounter: Payer: Self-pay | Admitting: Internal Medicine

## 2022-05-18 ENCOUNTER — Encounter: Payer: Self-pay | Admitting: Internal Medicine

## 2022-05-18 ENCOUNTER — Ambulatory Visit (INDEPENDENT_AMBULATORY_CARE_PROVIDER_SITE_OTHER): Payer: Medicare Other | Admitting: Internal Medicine

## 2022-05-18 VITALS — BP 120/80 | HR 56 | Temp 97.9°F | Resp 16 | Ht 73.0 in | Wt 195.6 lb

## 2022-05-18 DIAGNOSIS — L723 Sebaceous cyst: Secondary | ICD-10-CM

## 2022-05-18 NOTE — Progress Notes (Signed)
    Future Appointments  Date Time Provider Department  05/18/2022  4:30 PM Unk Pinto, MD [facility]  06/16/2022 11:00 AM Darrol Jump, NP [facility]  09/18/2022  2:30 PM Unk Pinto, MD [facility]  03/22/2023 11:00 AM Unk Pinto, MD [facility]    History of Present Illness:     Patient is a very nice 71 yo MWM presenting with concerns re: a sore lump at the base of the penis. He denies any penile discharges, dysuria  or external intimate exposures .   Medications    testosterone cypio 200 MG/ML injec, INJECT 2 ML IM EVERY 2 WEEKS   atenolol  25 MG tablet, TAKE 1 TABLET  EVERY DAY FOR BLOOD PRESSURE   losartan 50 MG tablet, Take 1 tablet Daily for BP   tadalafil  20 MG tablet, Take 1/2 to 1 tablet every 2 to 3 days as needed    aspirin 81 MG tablet, Take daily.   meloxicam 7.5 MG tablet, Take  1 tablet  Daily    VITAMIN C 1000 MG tablet, Take daily.   VITAMIN D 8,000 Units daily.    citalopram  20 MG tablet, TAKE 1 TABLET EVERY DAY FOR MOOD   finasteride (5 MG tablet, Take  Daily  as Directed   XALATAN 0.005 % opth soln, Place 1 drop into both eyes at bedtime.   Multiple Vitamins-Minerals, Take 1 tablet daily.    tamsulosin  0.4 MG CAPS, Take  2 capsules  at Bedtime    BETIMOL 0.5 % ophth soln, Place 1 drop into both eyes in the morning, at noon, and at bedtime.   Problem list He has Essential hypertension; Mixed hyperlipidemia; GERD ; Other abnormal glucose; Gout; Testosterone Deficiency; Vitamin D deficiency; Medication management; Open-angle glaucoma; CAD in native artery; Overweight (BMI 25.0-29.9); Former smoker (37.5 pack year, quit 2000); Erectile dysfunction; Aortic atherosclerosis ([place]) by CXR in 2018; Benign prostatic hyperplasia with nocturia; and Depression, major, in remission ([place]) on their problem list.   Observations/Objective:  BP 120/80   Pulse (!) 56   Temp 97.9 F (36.6 C)   Resp 16   Ht 6\' 1"  (1.854 m)   Wt 195 lb 9.6 oz (88.7 kg)    SpO2 97%   BMI 25.81 kg/m   GU focused exam finds a sl swollen oil gland cyst ath the underside of the lt base of the penis at the scrotal fold . No erythema or fluctuance noted.   Assessment and Plan:   1. Sebaceous cyst of scrotum  - Reassured patient    Follow Up Instructions:        I discussed the assessment and treatment plan  for "watchful waiting"  with the patient. The patient was provided an opportunity to ask questions and all were answered. The patient agreed with the plan and demonstrated an understanding of the instructions.       The patient was advised to call back or seek an in-person evaluation if the symptoms worsen or if the condition fails to improve as anticipated.   Kirtland Bouchard, MD

## 2022-06-16 ENCOUNTER — Ambulatory Visit: Payer: Medicare Other | Admitting: Nurse Practitioner

## 2022-07-04 ENCOUNTER — Other Ambulatory Visit (HOSPITAL_COMMUNITY): Payer: Self-pay

## 2022-07-05 ENCOUNTER — Other Ambulatory Visit: Payer: Self-pay | Admitting: Internal Medicine

## 2022-07-05 DIAGNOSIS — N138 Other obstructive and reflux uropathy: Secondary | ICD-10-CM

## 2022-09-05 DIAGNOSIS — H40053 Ocular hypertension, bilateral: Secondary | ICD-10-CM | POA: Diagnosis not present

## 2022-09-14 ENCOUNTER — Other Ambulatory Visit: Payer: Self-pay | Admitting: Internal Medicine

## 2022-09-14 DIAGNOSIS — I10 Essential (primary) hypertension: Secondary | ICD-10-CM

## 2022-09-18 ENCOUNTER — Encounter: Payer: Self-pay | Admitting: Internal Medicine

## 2022-09-18 ENCOUNTER — Ambulatory Visit (INDEPENDENT_AMBULATORY_CARE_PROVIDER_SITE_OTHER): Payer: Medicare Other | Admitting: Internal Medicine

## 2022-09-18 VITALS — BP 130/80 | HR 75 | Temp 97.9°F | Resp 16 | Ht 73.0 in | Wt 202.0 lb

## 2022-09-18 DIAGNOSIS — I700 Atherosclerosis of aorta: Secondary | ICD-10-CM | POA: Diagnosis not present

## 2022-09-18 DIAGNOSIS — Z79899 Other long term (current) drug therapy: Secondary | ICD-10-CM | POA: Diagnosis not present

## 2022-09-18 DIAGNOSIS — R7309 Other abnormal glucose: Secondary | ICD-10-CM | POA: Diagnosis not present

## 2022-09-18 DIAGNOSIS — E559 Vitamin D deficiency, unspecified: Secondary | ICD-10-CM | POA: Diagnosis not present

## 2022-09-18 DIAGNOSIS — I10 Essential (primary) hypertension: Secondary | ICD-10-CM

## 2022-09-18 DIAGNOSIS — E782 Mixed hyperlipidemia: Secondary | ICD-10-CM | POA: Diagnosis not present

## 2022-09-18 DIAGNOSIS — E291 Testicular hypofunction: Secondary | ICD-10-CM

## 2022-09-18 MED ORDER — VITAMIN B-12 1000 MCG SL SUBL: SUBLINGUAL_TABLET | SUBLINGUAL | 0 refills | Status: AC

## 2022-09-18 NOTE — Progress Notes (Signed)
Future Appointments  Date Time Provider Department  03/22/2023 11:00 AM Unk Pinto, MD [facility]    History of Present Illness:       This very nice 72 y.o. MWM  presents for 6 month follow up with HTN, HLD, Pre-Diabetes and Vitamin D Deficiency. Patient has prior hx/o Gout currently quiescent off meds.         Patient is treated for HTN  (1994) & BP has been controlled at home. Today's BP is at goal - 130/80.   Heart cath in 2018 found insignificant CAD. Patient has had no complaints of any cardiac type chest pain, palpitations, dyspnea Vertell Limber /PND, dizziness, claudication or dependent edema.        Hyperlipidemia is controlled with Vegan diet &  no meds.  Last Lipids were at goal:  Lab Results  Component Value Date   CHOL 190 03/16/2022   HDL 69 03/16/2022   LDLCALC 104 (H) 03/16/2022   TRIG 80 03/16/2022   CHOLHDL 2.8 03/16/2022                                         Patient has hx/o Testosterone Deficiency & is off replacement .   Also, the patient has history of PreDiabetes   (A1c 5.7% /2012) and has had no symptoms of reactive hypoglycemia, diabetic polys, paresthesias or visual blurring.  Last A1c was normal & at goal:  Lab Results  Component Value Date   HGBA1C 4.9 01/27/2020           Further, the patient also has history of Vitamin D Deficiency and supplements vitamin D without any suspected side-effects. Last vitamin D was at goal:  Lab Results  Component Value Date   VD25OH 7 01/27/2020    Current Outpatient Medications  Medication Instructions   aspirin 81 mg, Oral, Daily   VITAMIN D  8,000 Units, Oral, Daily   citalopram (CELEXA) 20 MG tablet TAKE 1 TABLET EVERY DAY FOR MOOD   Finasteride 5 mg  Take  Daily  as Directed   Xalatan 0.005 % 1 drop, Both Eyes, Daily at bedtime   losartan (COZAAR) 50 MG tablet TAKE 1 TABLET DAILY FOR BLOOD PRESSURE   meloxicam (MOBIC) 7.5 MG tablet Take  1 tablet  Daily  with Food for Pain     tadalafil (CIALIS) 20 MG tablet Take 1/2 to 1 tablet every 2 to 3 days as needed   timolol (BETIMOL) 0.5 % ophth  son 1 drop, Both Eyes, 3 times daily   vitamin C 1,000 mg, Oral, Daily     Allergies  Allergen Reactions   Ace Inhibitors cough   Synthetic opiods Itching    PMHx:   Past Medical History:  Diagnosis Date   Anxiety    Cervical stenosis of spine    Depression    GERD (gastroesophageal reflux disease)    Glaucoma    Gout    Hyperlipidemia    Hypertension    Hypogonadism male    Prediabetes    Vitamin D deficiency     Immunization History  Administered Date(s) Administered   Influenza, High Dose  09/02/2018, 05/06/2020   Influenz 05/15/2019   PFIZER SARS-COV-2 Vacc 09/13/2019, 10/06/2019, 05/09/2020   PPD Test 01/02/2014, 01/21/2015   Pneumococcal - 13 12/18/2018   Pneumococcal 23 01/27/2020   Pneumococcal - 23 08/23/1992   Tdap  08/19/2012     Past Surgical History:  Procedure Laterality Date   LEFT HEART CATH AND CORONARY ANGIOGRAPHY N/A 04/11/2017   Procedure: LEFT HEART CATH AND CORONARY ANGIOGRAPHY;  Surgeon: Belva Crome, MD;  Location: [facility];  Service: Cardiovascular;  Laterality: N/A;     FHx:    Reviewed / unchanged   SHx:    Reviewed / unchanged    Systems Review:  Constitutional: Denies fever, chills, wt changes, headaches, insomnia, fatigue, night sweats, change in appetite. Eyes: Denies redness, blurred vision, diplopia, discharge, itchy, watery eyes.  ENT: Denies discharge, congestion, post nasal drip, epistaxis, sore throat, earache, hearing loss, dental pain, tinnitus, vertigo, sinus pain, snoring.  CV: Denies chest pain, palpitations, irregular heartbeat, syncope, dyspnea, diaphoresis, orthopnea, PND, claudication or edema. Respiratory: denies cough, dyspnea, DOE, pleurisy, hoarseness, laryngitis, wheezing.  Gastrointestinal: Denies dysphagia, odynophagia, heartburn, reflux, water brash, abdominal pain or cramps,  nausea, vomiting, bloating, diarrhea, constipation, hematemesis, melena, hematochezia  or hemorrhoids. Genitourinary: Denies dysuria, frequency, urgency, nocturia, hesitancy, discharge, hematuria or flank pain. Musculoskeletal: Denies arthralgias, myalgias, stiffness, jt. swelling, pain, limping or strain/sprain.  Skin: Denies pruritus, rash, hives, warts, acne, eczema or change in skin lesion(s). Neuro: No weakness, tremor, incoordination, spasms, paresthesia or pain. Psychiatric: Denies confusion, memory loss or sensory loss. Endo: Denies change in weight, skin or hair change.  Heme/Lymph: No excessive bleeding, bruising or enlarged lymph nodes.  Physical Exam  BP 130/80   Pulse 75   Temp 97.9 F (36.6 C)   Resp 16   Ht 6' 1"$  (1.854 m)   Wt 202 lb (91.6 kg)   SpO2 98%   BMI 26.65 kg/m   Appears  well nourished, well groomed  and in no distress.  Eyes: PERRLA, EOMs, conjunctiva no swelling or erythema. Sinuses: No frontal/maxillary tenderness ENT/Mouth: EAC's clear, TM's nl w/o erythema, bulging. Nares clear w/o erythema, swelling, exudates. Oropharynx clear without erythema or exudates. Oral hygiene is good. Tongue normal, non obstructing. Hearing intact.  Neck: Supple. Thyroid not palpable. Car 2+/2+ without bruits, nodes or JVD. Chest: Respirations nl with BS clear & equal w/o rales, rhonchi, wheezing or stridor.  Cor: Heart sounds normal w/ regular rate and rhythm without sig. murmurs, gallops, clicks or rubs. Peripheral pulses normal and equal  without edema.  Abdomen: Soft & bowel sounds normal. Non-tender w/o guarding, rebound, hernias, masses or organomegaly.  Lymphatics: Unremarkable.  Musculoskeletal: Full ROM all peripheral extremities, joint stability, 5/5 strength and normal gait.  Skin: Warm, dry without exposed rashes, lesions or ecchymosis apparent.  Neuro: Cranial nerves intact, reflexes equal bilaterally. Sensory-motor testing grossly intact. Tendon reflexes  grossly intact.  Pysch: Alert & oriented x 3.  Insight and judgement nl & appropriate. No ideations.  Assessment and Plan:   1. Essential hypertension  - CBC with Differential/Platelet - COMPLETE METABOLIC PANEL WITH GFR - Magnesium - TSH  2. Hyperlipidemia, mixed  - Lipid panel - TSH  3. Abnormal glucose  - Hemoglobin A1c - Insulin, random  4. Vitamin D deficiency  - VITAMIN D 25 Hydroxy  5. Testosterone Deficiency  - Testosterone  6. Aortic atherosclerosis ([facility])  - Lipid panel  7. Medication management  - CBC with Differential/Platelet - COMPLETE METABOLIC PANEL WITH GFR - Magnesium - Lipid panel - TSH - Hemoglobin A1c - Insulin, random - VITAMIN D 25 Hydroxy - Testosterone         Discussed  regular exercise, BP monitoring, weight control to achieve/maintain BMI less than 25  and discussed med and SE's. Recommended labs to assess and monitor clinical status with further disposition pending results of labs.  I discussed the assessment and treatment plan with the patient. The patient was provided an opportunity to ask questions and all were answered. The patient agreed with the plan and demonstrated an understanding of the instructions.  I provided over 30 minutes of exam, counseling, chart review and  complex critical decision making.       The patient was advised to call back or seek an in-person evaluation if the symptoms worsen or if the condition fails to improve as anticipated.   Kirtland Bouchard, MD

## 2022-09-18 NOTE — Patient Instructions (Signed)

## 2022-09-19 ENCOUNTER — Other Ambulatory Visit: Payer: Self-pay | Admitting: Internal Medicine

## 2022-09-19 DIAGNOSIS — E782 Mixed hyperlipidemia: Secondary | ICD-10-CM

## 2022-09-19 LAB — TESTOSTERONE: Testosterone: 57 ng/dL — ABNORMAL LOW (ref 250–827)

## 2022-09-19 LAB — INSULIN, RANDOM: Insulin: 22.1 u[IU]/mL — ABNORMAL HIGH

## 2022-09-19 LAB — CBC WITH DIFFERENTIAL/PLATELET
Absolute Monocytes: 420 cells/uL (ref 200–950)
Basophils Absolute: 71 cells/uL (ref 0–200)
Basophils Relative: 1.7 %
Eosinophils Absolute: 433 cells/uL (ref 15–500)
Eosinophils Relative: 10.3 %
HCT: 42.1 % (ref 38.5–50.0)
Hemoglobin: 14.8 g/dL (ref 13.2–17.1)
Lymphs Abs: 1462 cells/uL (ref 850–3900)
MCH: 31.2 pg (ref 27.0–33.0)
MCHC: 35.2 g/dL (ref 32.0–36.0)
MCV: 88.6 fL (ref 80.0–100.0)
MPV: 13.4 fL — ABNORMAL HIGH (ref 7.5–12.5)
Monocytes Relative: 10 %
Neutro Abs: 1814 cells/uL (ref 1500–7800)
Neutrophils Relative %: 43.2 %
Platelets: 149 10*3/uL (ref 140–400)
RBC: 4.75 10*6/uL (ref 4.20–5.80)
RDW: 12.6 % (ref 11.0–15.0)
Total Lymphocyte: 34.8 %
WBC: 4.2 10*3/uL (ref 3.8–10.8)

## 2022-09-19 LAB — MAGNESIUM: Magnesium: 2.2 mg/dL (ref 1.5–2.5)

## 2022-09-19 LAB — COMPLETE METABOLIC PANEL WITH GFR
AG Ratio: 2 (calc) (ref 1.0–2.5)
ALT: 19 U/L (ref 9–46)
AST: 17 U/L (ref 10–35)
Albumin: 4.6 g/dL (ref 3.6–5.1)
Alkaline phosphatase (APISO): 48 U/L (ref 35–144)
BUN: 17 mg/dL (ref 7–25)
CO2: 29 mmol/L (ref 20–32)
Calcium: 9.6 mg/dL (ref 8.6–10.3)
Chloride: 104 mmol/L (ref 98–110)
Creat: 0.71 mg/dL (ref 0.70–1.35)
Globulin: 2.3 g/dL (calc) (ref 1.9–3.7)
Glucose, Bld: 101 mg/dL — ABNORMAL HIGH (ref 65–99)
Potassium: 4.8 mmol/L (ref 3.5–5.3)
Sodium: 141 mmol/L (ref 135–146)
Total Bilirubin: 0.5 mg/dL (ref 0.2–1.2)
Total Protein: 6.9 g/dL (ref 6.1–8.1)
eGFR: 99 mL/min/{1.73_m2} (ref >=60)

## 2022-09-19 LAB — LIPID PANEL
Cholesterol: 243 mg/dL — ABNORMAL HIGH (ref <200)
HDL: 60 mg/dL (ref >=40)
LDL Cholesterol (Calc): 144 mg/dL (calc) — ABNORMAL HIGH
Non-HDL Cholesterol (Calc): 183 mg/dL (calc) — ABNORMAL HIGH (ref <130)
Total CHOL/HDL Ratio: 4.1 (calc) (ref <5.0)
Triglycerides: 239 mg/dL — ABNORMAL HIGH (ref <150)

## 2022-09-19 LAB — TSH: TSH: 1.25 mIU/L (ref 0.40–4.50)

## 2022-09-19 LAB — HEMOGLOBIN A1C
Hgb A1c MFr Bld: 5.2 % of total Hgb (ref <5.7)
Mean Plasma Glucose: 103 mg/dL
eAG (mmol/L): 5.7 mmol/L

## 2022-09-19 LAB — VITAMIN D 25 HYDROXY (VIT D DEFICIENCY, FRACTURES): Vit D, 25-Hydroxy: 90 ng/mL (ref 30–100)

## 2022-09-19 MED ORDER — ROSUVASTATIN CALCIUM 20 MG PO TABS: ORAL_TABLET | ORAL | 3 refills | Status: DC

## 2022-09-19 NOTE — Progress Notes (Signed)
<><><><><><><><><><><><><><><><><><><><><><><><><><><><><><><><><> <><><><><><><><><><><><><><><><><><><><><><><><><><><><><><><><><> - Test results slightly outside the reference range are not unusual. If there is anything important, I will review this with you,  otherwise it is considered normal test values.  If you have further questions,  please do not hesitate to contact me at the office or via My Chart.  <><><><><><><><><><><><><><><><><><><><><><><><><><><><><><><><><> <><><><><><><><><><><><><><><><><><><><><><><><><><><><><><><><><>  -  Chol is much worse  - has jumped up  from 190  to 243    &                     Bad LDL Chol has gone from recently  from 44 to    95   to 104   &                                          now 144 - high risk for Heart Attack,  Stroke   & Dementia  !    - Recommend start on Rosuvastatin,                                                   So don't have a Heart Attack, Stroke or develop Dementia           - New Rx sent to [facility] [address]  <><><><><><><><><><><><><><><><><><><><><><><><><><><><><><><><><>  - Diet is still very important   - Cholesterol only comes from animal sources                                                                               - ie. meat, dairy, egg yolks  - Eat all the vegetables you want.  - Avoid Meat, Avoid Meat,  Avoid Meat                                                            - especially Red Meat - Beef AND Pork .  - Avoid cheese & dairy - milk & ice cream.     - Cheese is the most concentrated form of trans-fats which  is the worst thing to clog up our arteries.   - Veggie cheese is OK which can be found in the fresh  produce section at [employer] or [employer] or [employer] <><><><><><><><><><><><><><><><><><><><><><><><><><><><><><><><><>  -  also   Triglycerides (  239    ) or fats in blood are too high                 (   Ideal or  Goal is less than 150  !  )    - Recommend avoid fried  & greasy foods,  sweets / candy,   - Avoid white rice  (brown or wild rice or Quinoa is OK),   - Avoid white potatoes  (sweet  potatoes are OK)   - Avoid anything made from white flour  - bagels, doughnuts, rolls, buns, biscuits, white and   wheat breads, pizza crust and traditional  pasta made of white flour & egg white  - (vegetarian pasta or spinach or wheat pasta is OK).    - Multi-grain bread is OK - like multi-grain flat bread or  sandwich thins.   - Avoid alcohol in excess.   - Exercise is also important. <><><><><><><><><><><><><><><><><><><><><><><><><><><><><><><><><>  -  Testosterone is low - Recc take Zinc ,    9 Ways to Naturally Increase Testosterone Levels  1.   Lose Weight  If you're overweight, shedding the excess pounds may increase your testosterone levels, according to research presented at the Endocrine Society's 2012 meeting. Overweight men are more likely to have low testosterone levels to begin with, so this is an important trick to increase your body's testosterone production when you need it most.  2.   High-Intensity Exercise like Peak Fitness   Short intense exercise has a proven positive effect on increasing testosterone levels and preventing its decline. That's unlike aerobics or prolonged moderate exercise, which have shown to have negative or no effect on testosterone levels. Having a whey protein meal after exercise can further enhance the satiety/testosterone-boosting impact (hunger hormones cause the opposite effect on your testosterone and libido). Here's a summary of what a typical high-intensity [employer]itness routine might look like: " Warm up for three minutes  " Exercise as hard and fast as you can for 30 seconds. You should feel like you couldn't possibly go on another few seconds  " Recover at a slow to moderate pace for 90 seconds  " Repeat the high intensity exercise and recovery 7 more times .  3.   Consume Plenty of Zinc  The  mineral zinc is important for testosterone production, and supplementing your diet for as little as six weeks has been shown to cause a marked improvement in testosterone among men with low levels.1 Likewise, research has shown that restricting dietary sources of zinc leads to a significant decrease in testosterone, while zinc supplementation increases it - and even protects men from exercised-induced reductions in testosterone levels.  It's estimated that up to 53 percent of adults over the age of 60 may have lower than recommended zinc intakes; even when dietary supplements were added in, an estimated 20-25 percent of older adults still had inadequate zinc intakes, according to a [employer] and Nutrition Examination Survey.4 Your diet is the best source of zinc; along with protein-rich foods like  fish, other good dietary sources of zinc include raw milk, raw cheese, beans, and yogurt or kefir made from raw milk. It can be difficult to obtain enough dietary zinc if you're a vegetarian, and also for meat-eaters as well, largely because of conventional farming methods that rely heavily on chemical fertilizers and pesticides. These chemicals deplete the soil of nutrients ... nutrients like zinc that must be absorbed by plants in order to be passed on to you. In many cases, you may further deplete the nutrients in your food by the way you prepare it. For most food, cooking it will drastically reduce its levels of nutrients like zinc ... particularly over-cooking, which many people do. If you decide to use a zinc supplement, stick to a dosage of 50 mg a day, as this is the recommended adult dose. Taking too much zinc can interfere with your body's ability to absorb other minerals, especially copper, and  may cause nausea as a side effect.  4.   Strength Training  In addition to Peak Fitness, strength training is also known to boost testosterone levels, provided you are doing so intensely enough. When  strength training to boost testosterone, you'll want to increase the weight and lower your number of reps, and then focus on exercises that work a large number of muscles, such as dead lifts or squats.  You can "turbo-charge" your weight training by going slower. By slowing down your movement, you're actually turning it into a high-intensity exercise. Super Slow movement allows your muscle, at the microscopic level, to access the maximum number of cross-bridges between the protein filaments that produce movement in the muscle.   5.   Optimize Your Vitamin D Levels  Vitamin D, a steroid hormone, is essential for the healthy development of the nucleus of the sperm cell, and helps maintain semen quality and sperm count. Vitamin D also increases levels of testosterone, which may boost libido. In one study, overweight men who were given vitamin D supplements had a significant increase in testosterone levels after one year.5   6.   Reduce Stress  When you're under a lot of stress, your body releases high levels of the stress hormone cortisol. This hormone actually blocks the effects of testosterone,6 presumably because, from a biological standpoint, testosterone-associated behaviors (mating, competing, aggression) may have lowered your chances of survival in an emergency (hence, the "fight or flight" response is dominant, courtesy of cortisol).  7.   Limit or Eliminate Sugar from Your Diet  Testosterone levels decrease after you eat sugar, which is likely because the sugar leads to a high insulin level, another factor leading to low testosterone.7 Based on USDA estimates, the average American consumes 12 teaspoons of sugar a day, which equates to about TWO TONS of sugar during a lifetime.  8.   Eat Healthy Fats  By healthy, this means not only mono- and polyunsaturated fats, like that found in avocadoes and nuts, but also saturated, as these are essential for building testosterone. Research shows that a  diet with less than 40 percent of energy as fat (and that mainly from animal sources, i.e. saturated) lead to a decrease in testosterone levels. ie eat less animal products - as Meat , poultry and dairy. Experts agree that the ideal diet includes somewhere between 50-70 percent fat.  It's important to understand that your body requires saturated fats from animal and vegetable sources (such as meat, dairy, certain oils, and tropical plants like coconut) for optimal functioning, and if you neglect this important food group in favor of sugar, grains and other starchy carbs, your health and weight are almost guaranteed to suffer. Examples of healthy fats you can eat more of to give your testosterone levels a boost include: Olives and Olive oil  Coconuts and coconut oil Butter made from raw grass-fed organic milk Raw nuts, such as, almonds or pecans Organic pastured egg yolks Avocados Grass-fed meats Palm oil Unheated organic nut oils  9.   Boost Your Intake of Branch Chain Amino Acids (BCAA) from Foods Like [place] suggests that BCAAs result in higher testosterone levels, particularly when taken along with resistance training. While BCAAs are available in supplement form, you'll find the highest concentrations of BCAAs like leucine in whey protein. Even when getting leucine from your natural food supply, it's often wasted or used as a building block instead of an anabolic agent. So to create the correct anabolic environment, you  need to boost leucine consumption way beyond mere maintenance levels. That said, keep in mind that using leucine as a free form amino acid can be highly counterproductive as when free form amino acids are artificially administrated, they rapidly enter your circulation while disrupting insulin function, and impairing your body's glycemic control. Food-based leucine is really the ideal form that can benefit your muscles without side effects.   <><><><><><><><><><><><><><><><><><><><><><><><><><><><><><><><><>  -  A1c - Normal -No Diabetes  - Great !  <><><><><><><><><><><><><><><><><><><><><><><><><><><><><><><><><>  -  Vitamin D = 90 - Excellent - Please keep dose same  <><><><><><><><><><><><><><><><><><><><><><><><><><><><><><><><><>  -  All Else - CBC - Kidneys - Electrolytes - Liver - Magnesium & Thyroid

## 2022-09-20 ENCOUNTER — Encounter: Payer: Self-pay | Admitting: Internal Medicine

## 2022-12-19 ENCOUNTER — Ambulatory Visit: Payer: Medicare Other | Admitting: Nurse Practitioner

## 2023-01-09 DIAGNOSIS — M79641 Pain in right hand: Secondary | ICD-10-CM | POA: Diagnosis not present

## 2023-01-09 DIAGNOSIS — M79642 Pain in left hand: Secondary | ICD-10-CM | POA: Diagnosis not present

## 2023-02-18 ENCOUNTER — Other Ambulatory Visit: Payer: Self-pay | Admitting: Internal Medicine

## 2023-03-04 ENCOUNTER — Other Ambulatory Visit: Payer: Self-pay | Admitting: Internal Medicine

## 2023-03-04 DIAGNOSIS — E782 Mixed hyperlipidemia: Secondary | ICD-10-CM

## 2023-03-06 DIAGNOSIS — H40053 Ocular hypertension, bilateral: Secondary | ICD-10-CM | POA: Diagnosis not present

## 2023-03-22 ENCOUNTER — Encounter: Payer: Medicare Other | Admitting: Internal Medicine

## 2023-04-11 DIAGNOSIS — M65312 Trigger thumb, left thumb: Secondary | ICD-10-CM | POA: Diagnosis not present

## 2023-04-19 ENCOUNTER — Ambulatory Visit (INDEPENDENT_AMBULATORY_CARE_PROVIDER_SITE_OTHER): Payer: Medicare Other | Admitting: Internal Medicine

## 2023-04-19 ENCOUNTER — Encounter: Payer: Self-pay | Admitting: Internal Medicine

## 2023-04-19 VITALS — BP 122/80 | HR 44 | Temp 97.9°F | Resp 16 | Ht 73.0 in | Wt 205.2 lb

## 2023-04-19 DIAGNOSIS — Z79899 Other long term (current) drug therapy: Secondary | ICD-10-CM

## 2023-04-19 DIAGNOSIS — Z136 Encounter for screening for cardiovascular disorders: Secondary | ICD-10-CM | POA: Diagnosis not present

## 2023-04-19 DIAGNOSIS — N138 Other obstructive and reflux uropathy: Secondary | ICD-10-CM

## 2023-04-19 DIAGNOSIS — E291 Testicular hypofunction: Secondary | ICD-10-CM

## 2023-04-19 DIAGNOSIS — Z87891 Personal history of nicotine dependence: Secondary | ICD-10-CM

## 2023-04-19 DIAGNOSIS — I700 Atherosclerosis of aorta: Secondary | ICD-10-CM | POA: Diagnosis not present

## 2023-04-19 DIAGNOSIS — M1000 Idiopathic gout, unspecified site: Secondary | ICD-10-CM

## 2023-04-19 DIAGNOSIS — N401 Enlarged prostate with lower urinary tract symptoms: Secondary | ICD-10-CM

## 2023-04-19 DIAGNOSIS — R7309 Other abnormal glucose: Secondary | ICD-10-CM | POA: Diagnosis not present

## 2023-04-19 DIAGNOSIS — Z1211 Encounter for screening for malignant neoplasm of colon: Secondary | ICD-10-CM

## 2023-04-19 DIAGNOSIS — Z125 Encounter for screening for malignant neoplasm of prostate: Secondary | ICD-10-CM | POA: Diagnosis not present

## 2023-04-19 DIAGNOSIS — E559 Vitamin D deficiency, unspecified: Secondary | ICD-10-CM

## 2023-04-19 DIAGNOSIS — Z8249 Family history of ischemic heart disease and other diseases of the circulatory system: Secondary | ICD-10-CM

## 2023-04-19 DIAGNOSIS — E782 Mixed hyperlipidemia: Secondary | ICD-10-CM | POA: Diagnosis not present

## 2023-04-19 DIAGNOSIS — I10 Essential (primary) hypertension: Secondary | ICD-10-CM

## 2023-04-19 NOTE — Progress Notes (Signed)
Comprehensive Evaluation & Examination   Future Appointments  Date Time Provider Department  04/19/2023                     cpe  3:00 PM Lucky Cowboy, MD [facility]  05/01/2024                    cpe  2:00 PM Lucky Cowboy, MD [facility]          This very nice 72 y.o. MWM presents for a  comprehensive evaluation and management of multiple medical co-morbidities.  Patient has been followed for HTN, HLD, Prediabetes and Vitamin D Deficiency. CXR in 2018 showed Aortic Atherosclerosis. Patient also has hx of gout (off meds).       HTN predates circa  1994. Patient's BP has been controlled and today's BP is at goal - 122/80 .  Heart cath in 2018 found  insignificant CAD.   Patient denies any cardiac symptoms as chest pain, palpitations, shortness of breath, dizziness or ankle swelling.       Patient' relates that he is now off lipitor & only on a Vegan diet. Last lipids were not at goal off meds :             Lab Results  Component Value Date   CHOL 243 (H) 09/18/2022   HDL 60 09/18/2022   LDLCALC 144 (H) 09/18/2022   TRIG 239 (H) 09/18/2022   CHOLHDL 4.1 09/18/2022                                    Patient has hx/o Testosterone Deficiency & has been on Testosterone inj  since 2015  with improved stamina & sense of well being.  Patient also has hx/o prediabetes (A1c 5.7% /2012) and patient denies reactive hypoglycemic symptoms, visual blurring, diabetic polys or paresthesias. Last A1c was normal & at goal :    Lab Results  Component Value Date   HGBA1C 5.2 09/18/2022         Finally, patient has history of Vitamin D Deficiency ("14" /2008) and last vitamin D was at goal :   Lab Results  Component Value Date   VD25OH 90 09/18/2022       Current Outpatient Medications  Medication Instructions   Aspirin  81 mg Daily   VITAMIN D   8,000 Units Daily   VITAMIN B-12   1000   MCG SL Takes 1 tablet  Daily   finasteride  5 MG tablet TAKE DAILY AS DIRECTED   XALATAN  0.005 % ophth soln 1 drop, Both Eye  at bedtime   losartan 50 MG tablet TAKE 1 TABLET DAILY    meloxicam  7.5 MG tablet Take  1 tablet  Daily     rosuvastatin  20 MG tablet Take  1 tablet  Daily    tadalafil 20 MG tablet Take 1/2 to 1 tablet every 2 to 3 days as needed    timolol (BETIMOL) 0.5 % ophth soln 1 drop, Both Eyes, 3 times daily   vitamin C  1,000 mg Daily     Allergies  Allergen Reactions   Ace Inhibitors     cough   Synthetic opioids Itching     Full Body itching     Past Medical History:  Diagnosis Date   Anxiety    Cervical stenosis of spine    Depression  GERD (gastroesophageal reflux disease)    Glaucoma    Gout    Hyperlipidemia    Hypertension    Hypogonadism male    Prediabetes    Vitamin D deficiency      Health Maintenance  Topic Date Due   Zoster Vaccines- Shingrix (1 of 2) Never done   COVID-19 Vaccine (5 - Booster for Pfizer series) 03/05/2021   INFLUENZA VACCINE  02/28/2021   Fecal DNA (Cologuard)  03/22/2021   TETANUS/TDAP  08/19/2022   Hepatitis C Screening  Completed   PNA vac Low Risk Adult  Completed   HPV VACCINES  Aged Out     Immunization History  Administered Date(s) Administered   Influenza, High Dose  09/02/2018, 05/06/2020   Influenza 05/15/2019   PFIZER  Covid-19 Tri-Sucrose Vacc 11/03/2020   PFIZER SARS-COV-2 Vacc 09/13/2019, 10/06/2019, 05/09/2020   PPD Test 01/02/2014, 01/21/2015   Pneumococcal - 13 12/18/2018   Pneumococcal - 23 01/27/2020   Pneumococcal - 23 08/23/1992   Tdap 08/19/2012    03/22/2018  - Cologard - Negative - 3 year f/u due now.  Past Surgical History:  Procedure Laterality Date   LEFT HEART CATH AND CORONARY ANGIOGRAPHY - Lyn Records, MD; N/A 04/11/2017     Family History  Problem Relation   Liver cancer Mother   Heart disease Father   Heart disease Brother   Hypertension Brother   Heart disease Paternal Grandmother   Stroke Paternal Grandmother   Stroke Paternal Grandfather    Healthy Son    Social History   Socioeconomic History   Marital status: Married    Spouse name: Vickie   Number of children: 1 son & 1 daughter   Occupational History   Retired  Tobacco Use   Smoking status: Former    Packs/day: 1.50    Years: 25.00    Pack years: 37.50    Types: Cigarettes    Quit date: 07/31/1998    Years since quitting: 22.6   Smokeless tobacco: Never  Substance and Sexual Activity   Alcohol use: Yes    Alcohol/week: 7.0 standard drinks    Types: 7 Standard drinks or equivalent per week   Drug use: No   Sexual activity: Not on file      ROS Constitutional: Denies fever, chills, weight loss/gain, headaches, insomnia,  night sweats or change in appetite. Does c/o fatigue. Eyes: Denies redness, blurred vision, diplopia, discharge, itchy or watery eyes.  ENT: Denies discharge, congestion, post nasal drip, epistaxis, sore throat, earache, hearing loss, dental pain, Tinnitus, Vertigo, Sinus pain or snoring.  Cardio: Denies chest pain, palpitations, irregular heartbeat, syncope, dyspnea, diaphoresis, orthopnea, PND, claudication or edema Respiratory: denies cough, dyspnea, DOE, pleurisy, hoarseness, laryngitis or wheezing.  Gastrointestinal: Denies dysphagia, heartburn, reflux, water brash, pain, cramps, nausea, vomiting, bloating, diarrhea, constipation, hematemesis, melena, hematochezia, jaundice or hemorrhoids Genitourinary: Denies dysuria, frequency, urgency, nocturia, hesitancy, discharge, hematuria or flank pain Musculoskeletal: Denies arthralgia, myalgia, stiffness, Jt. Swelling, pain, limp or strain/sprain. Denies Falls. Skin: Denies puritis, rash, hives, warts, acne, eczema or change in skin lesion Neuro: No weakness, tremor, incoordination, spasms, paresthesia or pain Psychiatric: Denies confusion, memory loss or sensory loss. Denies Depression. Endocrine: Denies change in weight, skin, hair change, nocturia, and paresthesia, diabetic polys, visual  blurring or hyper / hypo glycemic episodes.  Heme/Lymph: No excessive bleeding, bruising or enlarged lymph nodes.   Physical Exam  BP 122/80   Pulse (!) 44   Temp 97.9 F (36.6 C)   Resp  16   Ht 6\' 1"  (1.854 m)   Wt 205 lb 3.2 oz (93.1 kg)   SpO2 98%   BMI 27.07 kg/m   General Appearance: Well nourished and well groomed and in no apparent distress.  Eyes: PERRLA, EOMs, conjunctiva no swelling or erythema, normal fundi and vessels. Sinuses: No frontal/maxillary tenderness ENT/Mouth: EACs patent / TMs  nl. Nares clear without erythema, swelling, mucoid exudates. Oral hygiene is good. No erythema, swelling, or exudate. Tongue normal, non-obstructing. Tonsils not swollen or erythematous. Hearing normal.  Neck: Supple, thyroid not palpable. No bruits, nodes or JVD. Respiratory: Respiratory effort normal.  BS equal and clear bilateral without rales, rhonci, wheezing or stridor. Cardio: Heart sounds are normal with regular rate and rhythm and no murmurs, rubs or gallops. Peripheral pulses are normal and equal bilaterally without edema. No aortic or femoral bruits. Chest: symmetric with normal excursions and percussion.  Abdomen: Soft, with Nl bowel sounds. Nontender, no guarding, rebound, hernias, masses, or organomegaly.  Lymphatics: Non tender without lymphadenopathy.  Musculoskeletal: Full ROM all peripheral extremities, joint stability, 5/5 strength, and normal gait. Skin: Warm and dry without rashes, lesions, cyanosis, clubbing or  ecchymosis.  Neuro: Cranial nerves intact, reflexes equal bilaterally. Normal muscle tone, no cerebellar symptoms. Sensation intact.  Pysch: Alert and oriented X 3 with normal affect, insight and judgment appropriate.   Assessment and Plan                1. Essential hypertension  - EKG 12-Lead - Urinalysis, Routine w reflex microscopic - Microalbumin / creatinine urine ratio - CBC with Differential/Platelet - COMPLETE METABOLIC PANEL WITH GFR -  Magnesium - TSH  2. Hyperlipidemia, mixed  - EKG 12-Lead - Lipid panel - TSH  3. Abnormal glucose  - EKG 12-Lead - Hemoglobin A1c - Insulin, random  4. Vitamin D deficiency  - VITAMIN D 25 Hydroxy   5. Testosterone Deficiency  - Testosterone - Lipid panel  6. Aortic atherosclerosis (HCC)  - EKG 12-Lead  7. BPH with obstruction/lower urinary tract symptoms  - PSA  8. Idiopathic gout  - Uric acid  9. Prostate cancer screening  - PSA  10. Screening for colorectal cancer  - POC Hemoccult Bld/Stl    11. Screening for heart disease  - EKG 12-Lead   12. FHx: heart disease  - EKG 12-Lead   13. Former smoker  - EKG 12-Lead   14. Medication management  - Urinalysis, Routine w reflex microscopic - Microalbumin / creatinine urine ratio - Testosterone - Uric acid - CBC with Differential/Platelet - COMPLETE METABOLIC PANEL WITH GFR - Magnesium - Lipid panel - TSH - Hemoglobin A1c - Insulin, random - VITAMIN D 25 Hydroxy           Patient was counseled in prudent diet, weight control to achieve/maintain BMI less than 25, BP monitoring, regular exercise and medications as discussed.  Discussed med effects and SE's. Routine screening labs and tests as requested with regular follow-up as recommended. Over 40 minutes of exam, counseling, chart review and high complex critical decision making was performed   Marinus Maw, MD

## 2023-04-19 NOTE — Patient Instructions (Signed)
Due to recent changes in healthcare laws, you may see the results of your imaging and laboratory studies on MyChart before your provider has had a chance to review them.  We understand that in some cases there may be results that are confusing or concerning to you. Not all laboratory results come back in the same time frame and the provider may be waiting for multiple results in order to interpret others.  Please give [place] 48 hours in order for your provider to thoroughly review all the results before contacting the office for clarification of your results.   +++++++++++++++++++++++++++++++  Vit D  & Vit C 1,000 mg   are recommended to help protect  against the Covid-19 and other Corona viruses.    Also it's recommended  to take  Zinc 50 mg  to help  protect against the Covid-19   and best place to get  is also on [email or web address]  and don't pay more than 6-8 cents /pill !  ================================ Coronavirus (COVID-19) Are you at risk?  Are you at risk for the Coronavirus (COVID-19)?  To be considered HIGH RISK for Coronavirus (COVID-19), you have to meet the following criteria:  Traveled to [place], [place], [place], [place] or [place]; or in the [place] to [place], [place], [place]  or [place]; and have fever, cough, and shortness of breath within the last 2 weeks of travel OR Been in close contact with a person diagnosed with COVID-19 within the last 2 weeks and have  fever, cough,and shortness of breath  IF YOU DO NOT MEET THESE CRITERIA, YOU ARE CONSIDERED LOW RISK FOR COVID-19.  What to do if you are HIGH RISK for COVID-19?  If you are having a medical emergency, call 911. Seek medical care right away. Before you go to a doctor's office, urgent care or emergency department,  call ahead and tell them about your recent travel, contact with someone diagnosed with COVID-19   and your symptoms.  You should receive instructions from your physician's office regarding  next steps of care.  When you arrive at healthcare provider, tell the healthcare staff immediately you have returned from  visiting [place], [place], [place], [place] or [place]; or traveled in the [place] to [place], [place],  [place] or [place] in the last two weeks or you have been in close contact with a person diagnosed with  COVID-19 in the last 2 weeks.   Tell the health care staff about your symptoms: fever, cough and shortness of breath. After you have been seen by a medical provider, you will be either: Tested for (COVID-19) and discharged home on quarantine except to seek medical care if  symptoms worsen, and asked to  Stay home and avoid contact with others until you get your results (4-5 days)  Avoid travel on public transportation if possible (such as bus, train, or airplane) or Sent to the Emergency Department by EMS for evaluation, COVID-19 testing  and  possible admission depending on your condition and test results.  What to do if you are LOW RISK for COVID-19?  Reduce your risk of any infection by using the same precautions used for avoiding the common cold or flu:  Wash your hands often with soap and warm water for at least 20 seconds.  If soap and water are not readily available,  use an alcohol-based hand sanitizer with at least 60% alcohol.  If coughing or sneezing, cover your mouth and nose by coughing  or sneezing into the elbow areas of your shirt or coat,  into a tissue or into your sleeve (not your hands). Avoid shaking hands with others and consider head nods or verbal greetings only. Avoid touching your eyes, nose, or mouth with unwashed hands.  Avoid close contact with people who are sick. Avoid places or events with large numbers of people in one location, like concerts or sporting events. Carefully consider travel plans you have or are making. If you are planning any travel outside or inside the [place], visit the CDC's Travelers' Health webpage for  the latest health notices. If you have some symptoms but not all symptoms, continue to monitor at home and seek medical attention  if your symptoms worsen. If you are having a medical emergency, call 911. >>>>>>>>>>>>>>>>>>>>>>>>>>>>>>>>>>>>>>>>>>>>>>>>>>> We Do NOT Approve of [employer] > > > > > > > > > > > > > > > > > > > > > > > > > > > > > > > > > > >  > >    Preventive Care for Adults  A healthy lifestyle and preventive care can promote health and wellness. Preventive health guidelines for men include the following key practices: A routine yearly physical is a good way to check with your health care provider about your health and preventative screening. It is a chance to share any concerns and updates on your health and to receive a thorough exam. Visit your dentist for a routine exam and preventative care every 6 months. Brush your teeth twice a day and floss once a day. Good oral hygiene prevents tooth decay and gum disease. The frequency of eye exams is based on your age, health, family medical history, use of contact lenses, and other factors. Follow your health care provider's recommendations for frequency of eye exams. Eat a healthy diet. Foods such as vegetables, fruits, whole grains, low-fat dairy products, and lean protein foods contain the nutrients you need without too many calories. Decrease your intake of foods high in solid fats, added sugars, and salt. Eat the right amount of calories for you. Get information about a proper diet from your health care provider, if necessary. Regular physical exercise is one of the most important things you can do for your health. Most adults should get at least 150 minutes of moderate-intensity exercise (any activity that increases your heart rate and causes you to sweat) each week. In addition, most adults need muscle-strengthening exercises on 2 or more days a week. Maintain a healthy weight. The body mass index (BMI) is a screening  tool to identify possible weight problems. It provides an estimate of body fat based on height and weight. Your health care provider can find your BMI and can help you achieve or maintain a healthy weight. For adults 20 years and older: A BMI below 18.5 is considered underweight. A BMI of 18.5 to 24.9 is normal. A BMI of 25 to 29.9 is considered overweight. A BMI of 30 and above is considered obese. Maintain normal blood lipids and cholesterol levels by exercising and minimizing your intake of saturated fat. Eat a balanced diet with plenty of fruit and vegetables. Blood tests for lipids and cholesterol should begin at age 30 and be repeated every 5 years. If your lipid or cholesterol levels are high, you are over 50, or you are at high risk for heart disease, you may need your cholesterol levels checked more frequently. Ongoing high lipid and cholesterol levels should be  treated with medicines if diet and exercise are not working. If you smoke, find out from your health care provider how to quit. If you do not use tobacco, do not start. Lung cancer screening is recommended for adults aged 55-80 years who are at high risk for developing lung cancer because of a history of smoking. A yearly low-dose CT scan of the lungs is recommended for people who have at least a 30-pack-year history of smoking and are a current smoker or have quit within the past 15 years. A pack year of smoking is smoking an average of 1 pack of cigarettes a day for 1 year (for example: 1 pack a day for 30 years or 2 packs a day for 15 years). Yearly screening should continue until the smoker has stopped smoking for at least 15 years. Yearly screening should be stopped for people who develop a health problem that would prevent them from having lung cancer treatment. If you choose to drink alcohol, do not have more than 2 drinks per day. One drink is considered to be 12 ounces (355 mL) of beer, 5 ounces (148 mL) of wine, or 1.5 ounces (44  mL) of liquor. Avoid use of street drugs. Do not share needles with anyone. Ask for help if you need support or instructions about stopping the use of drugs. High blood pressure causes heart disease and increases the risk of stroke. Your blood pressure should be checked at least every 1-2 years. Ongoing high blood pressure should be treated with medicines, if weight loss and exercise are not effective. If you are 14-35 years old, ask your health care provider if you should take aspirin to prevent heart disease. Diabetes screening involves taking a blood sample to check your fasting blood sugar level. Testing should be considered at a younger age or be carried out more frequently if you are overweight and have at least 1 risk factor for diabetes. Colorectal cancer can be detected and often prevented. Most routine colorectal cancer screening begins at the age of 89 and continues through age 20. However, your health care provider may recommend screening at an earlier age if you have risk factors for colon cancer. On a yearly basis, your health care provider may provide home test kits to check for hidden blood in the stool. Use of a small camera at the end of a tube to directly examine the colon (sigmoidoscopy or colonoscopy) can detect the earliest forms of colorectal cancer. Talk to your health care provider about this at age 71, when routine screening begins. Direct exam of the colon should be repeated every 5-10 years through age 38, unless early forms of precancerous polyps or small growths are found. Hepatitis C blood testing is recommended for all people born from [birth year] through 1963 and any individual with known risks for hepatitis C. Screening for abdominal aortic aneurysm (AAA)  by ultrasound is recommended for people who have history of high blood pressure or who are current or former smokers. Healthy men should  receive prostate-specific antigen (PSA) blood tests as part of routine cancer screening.  Talk with your health care provider about prostate cancer screening. Testicular cancer screening is  recommended for adult males. Screening includes self-exam, a health care provider exam, and other screening tests. Consult with your health care provider about any symptoms you have or any concerns you have about testicular cancer. Use sunscreen. Apply sunscreen liberally and repeatedly throughout the day. You should seek shade when your shadow is shorter than  you. Protect yourself by wearing long sleeves, pants, a wide-brimmed hat, and sunglasses year round, whenever you are outdoors. Once a month, do a whole-body skin exam, using a mirror to look at the skin on your back. Tell your health care provider about new moles, moles that have irregular borders, moles that are larger than a pencil eraser, or moles that have changed in shape or color. Stay current with required vaccines (immunizations). Influenza vaccine. All adults should be immunized every year. Tetanus, diphtheria, and acellular pertussis (Td, Tdap) vaccine. An adult who has not previously received Tdap or who does not know his vaccine status should receive 1 dose of Tdap. This initial dose should be followed by tetanus and diphtheria toxoids (Td) booster doses every 10 years. Adults with an unknown or incomplete history of completing a 3-dose immunization series with Td-containing vaccines should begin or complete a primary immunization series including a Tdap dose. Adults should receive a Td booster every 10 years. Zoster vaccine. One dose is recommended for adults aged 35 years or older unless certain conditions are present.  PREVNAR - Pneumococcal 13-valent conjugate (PCV13) vaccine. When indicated, a person who is uncertain of his immunization history and has no record of immunization should receive the PCV13 vaccine. An adult aged 65 years or older who has certain medical conditions and has not been previously immunized should receive 1  dose of PCV13 vaccine. This PCV13 should be followed with a dose of pneumococcal polysaccharide (PPSV23) vaccine. The PPSV23 vaccine dose should be obtained 1 or more year(s)after the dose of PCV13 vaccine. An adult aged 91 years or older who has certain medical conditions and previously received 1 or more doses of PPSV23 vaccine should receive 1 dose of PCV13. The PCV13 vaccine dose should be obtained 1 or more years after the last PPSV23 vaccine dose.  PNEUMOVAX - Pneumococcal polysaccharide (PPSV23) vaccine. When PCV13 is also indicated, PCV13 should be obtained first. All adults aged 43 years and older should be immunized. An adult younger than age 65 years who has certain medical conditions should be immunized. Any person who resides in a nursing home or long-term care facility should be immunized. An adult smoker should be immunized. People with an immunocompromised condition and certain other conditions should receive both PCV13 and PPSV23 vaccines. People with human immunodeficiency virus (HIV) infection should be immunized as soon as possible after diagnosis. Immunization during chemotherapy or radiation therapy should be avoided. Routine use of PPSV23 vaccine is not recommended for American Indians, [address], or people younger than 65 years unless there are medical conditions that require PPSV23 vaccine. When indicated, people who have unknown immunization and have no record of immunization should receive PPSV23 vaccine. One-time revaccination 5 years after the first dose of PPSV23 is recommended for people aged 19-64 years who have chronic kidney failure, nephrotic syndrome, asplenia, or immunocompromised conditions. People who received 1-2 doses of PPSV23 before age 80 years should receive another dose of PPSV23 vaccine at age 62 years or later if at least 5 years have passed since the previous dose. Doses of PPSV23 are not needed for people immunized with PPSV23 at or after age 29  years.  Hepatitis A vaccine. Adults who wish to be protected from this disease, have certain high-risk conditions, work with hepatitis A-infected animals, work in hepatitis A research labs, or travel to or work in countries with a high rate of hepatitis A should be immunized. Adults who were previously unvaccinated and who anticipate close contact  with an international adoptee during the first 60 days after arrival in the [place] from a country with a high rate of hepatitis A should be immunized.  Hepatitis B vaccine. Adults should be immunized if they wish to be protected from this disease, have certain high-risk conditions, may be exposed to blood or other infectious body fluids, are household contacts or sex partners of hepatitis B positive people, are clients or workers in certain care facilities, or travel to or work in countries with a high rate of hepatitis B.  Preventive Service / Frequency  Ages 75 and over Blood pressure check. Lipid and cholesterol check. Lung cancer screening. / Every year if you are aged 55-80 years and have a 30-pack-year history of smoking and currently smoke or have quit within the past 15 years. Yearly screening is stopped once you have quit smoking for at least 15 years or develop a health problem that would prevent you from having lung cancer treatment. Fecal occult blood test (FOBT) of stool. You may not have to do this test if you get a colonoscopy every 10 years. Flexible sigmoidoscopy** or colonoscopy.** / Every 5 years for a flexible sigmoidoscopy or every 10 years for a colonoscopy beginning at age 57 and continuing until age 35. Hepatitis C blood test.** / For all people born from [birth year] through 1963 and any individual with known risks for hepatitis C. Abdominal aortic aneurysm (AAA) screening./ Screening current or former smokers or have Hypertension. Skin self-exam. / Monthly. Influenza vaccine. / Every year. Tetanus, diphtheria, and acellular  pertussis (Tdap/Td) vaccine.** / 1 dose of Td every 10 years.  Zoster vaccine.** / 1 dose for adults aged 26 years or older.         Pneumococcal 13-valent conjugate (PCV13) vaccine.   Pneumococcal polysaccharide (PPSV23) vaccine.   Hepatitis A vaccine.** / Consult your health care provider. Hepatitis B vaccine.** / Consult your health care provider. Screening for abdominal aortic aneurysm (AAA)  by ultrasound is recommended for people who have history of high blood pressure or who are current or former smokers. ++++++++++ Recommend Adult Low Dose Aspirin or  coated  Aspirin 81 mg daily  To reduce risk of Colon Cancer 40 %,  Skin Cancer 26 % ,  Malignant Melanoma 46%  and  Pancreatic cancer 60% ++++++++++++++++++++++ Vitamin D goal  is between 70-100.  Please make sure that you are taking your Vitamin D as directed.  It is very important as a natural anti-inflammatory  helping hair, skin, and nails, as well as reducing stroke and heart attack risk.  It helps your bones and helps with mood. It also decreases numerous cancer risks so please take it as directed.  Low Vit D is associated with a 200-300% higher risk for CANCER  and 200-300% higher risk for HEART   ATTACK  &  STROKE.   .....................................Marland Kitchen It is also associated with higher death rate at younger ages,  autoimmune diseases like Rheumatoid arthritis, Lupus, Multiple Sclerosis.    Also many other serious conditions, like depression, Alzheimer's Dementia, infertility, muscle aches, fatigue, fibromyalgia - just to name a few. ++++++++++++++++++++++ Recommend the book "The END of DIETING" by Dr Monico Hoar  & the book "The END of DIABETES " by Dr Monico Hoar At [email or web address] - get book & Audio CD's    Being diabetic has a  300% increased risk for heart attack, stroke, cancer, and alzheimer- type vascular dementia. It is very important that you work harder with diet by  avoiding all foods that are white. Avoid  white rice (brown & wild rice is OK), white potatoes (sweetpotatoes in moderation is OK), White bread or wheat bread or anything made out of white flour like bagels, donuts, rolls, buns, biscuits, cakes, pastries, cookies, pizza crust, and pasta (made from white flour & egg whites) - vegetarian pasta or spinach or wheat pasta is OK. Multigrain breads like Arnold's or [employer], or multigrain sandwich thins or flatbreads.  Diet, exercise and weight loss can reverse and cure diabetes in the early stages.  Diet, exercise and weight loss is very important in the control and prevention of complications of diabetes which affects every system in your body, ie. Brain - dementia/stroke, eyes - glaucoma/blindness, heart - heart attack/heart failure, kidneys - dialysis, stomach - gastric paralysis, intestines - malabsorption, nerves - severe painful neuritis, circulation - gangrene & loss of a leg(s), and finally cancer and Alzheimers.    I recommend avoid fried & greasy foods,  sweets/candy, white rice (brown or wild rice or Quinoa is OK), white potatoes (sweet potatoes are OK) - anything made from white flour - bagels, doughnuts, rolls, buns, biscuits,white and wheat breads, pizza crust and traditional pasta made of white flour & egg white(vegetarian pasta or spinach or wheat pasta is OK).  Multi-grain bread is OK - like multi-grain flat bread or sandwich thins. Avoid alcohol in excess. Exercise is also important.    Eat all the vegetables you want - avoid meat, especially red meat and dairy - especially cheese.  Cheese is the most concentrated form of trans-fats which is the worst thing to clog up our arteries. Veggie cheese is OK which can be found in the fresh produce section at [employer] or [employer] or [employer]  ++++++++++++++++++++++ DASH Eating Plan  DASH stands for "Dietary Approaches to Stop Hypertension."   The DASH eating plan is a healthy eating plan that has been shown to reduce high  blood pressure (hypertension). Additional health benefits may include reducing the risk of type 2 diabetes mellitus, heart disease, and stroke. The DASH eating plan may also help with weight loss. WHAT DO I NEED TO KNOW ABOUT THE DASH EATING PLAN? For the DASH eating plan, you will follow these general guidelines: Choose foods with a percent daily value for sodium of less than 5% (as listed on the food label). Use salt-free seasonings or herbs instead of table salt or sea salt. Check with your health care provider or pharmacist before using salt substitutes. Eat lower-sodium products, often labeled as "lower sodium" or "no salt added." Eat fresh foods. Eat more vegetables, fruits, and low-fat dairy products. Choose whole grains. Look for the word "whole" as the first word in the ingredient list. Choose fish  Limit sweets, desserts, sugars, and sugary drinks. Choose heart-healthy fats. Eat veggie cheese  Eat more home-cooked food and less restaurant, buffet, and fast food. Limit fried foods. Cook foods using methods other than frying. Limit canned vegetables. If you do use them, rinse them well to decrease the sodium. When eating at a restaurant, ask that your food be prepared with less salt, or no salt if possible.                      WHAT FOODS CAN I EAT? Read Dr Francis Dowse Fuhrman's books on The End of Dieting & The End of Diabetes  Grains Whole grain or whole wheat bread. Brown rice. Whole grain or whole wheat pasta. Quinoa, bulgur, and  whole grain cereals. Low-sodium cereals. Corn or whole wheat flour tortillas. Whole grain cornbread. Whole grain crackers. Low-sodium crackers.  Vegetables Fresh or frozen vegetables (raw, steamed, roasted, or grilled). Low-sodium or reduced-sodium tomato and vegetable juices. Low-sodium or reduced-sodium tomato sauce and paste. Low-sodium or reduced-sodium canned vegetables.   Fruits All fresh, canned (in natural juice), or frozen fruits.  Protein  Products  All fish and seafood.  Dried beans, peas, or lentils. Unsalted nuts and seeds. Unsalted canned beans.  Dairy Low-fat dairy products, such as skim or 1% milk, 2% or reduced-fat cheeses, low-fat ricotta or cottage cheese, or plain low-fat yogurt. Low-sodium or reduced-sodium cheeses.  Fats and Oils Tub margarines without trans fats. Light or reduced-fat mayonnaise and salad dressings (reduced sodium). Avocado. Safflower, olive, or canola oils. Natural peanut or almond butter.  Other Unsalted popcorn and pretzels. The items listed above may not be a complete list of recommended foods or beverages. Contact your dietitian for more options.  ++++++++++++++++++++  WHAT FOODS ARE NOT RECOMMENDED? Grains/ White flour or wheat flour White bread. White pasta. White rice. Refined cornbread. Bagels and croissants. Crackers that contain trans fat.  Vegetables  Creamed or fried vegetables. Vegetables in a . Regular canned vegetables. Regular canned tomato sauce and paste. Regular tomato and vegetable juices.  Fruits Dried fruits. Canned fruit in light or heavy syrup. Fruit juice.  Meat and Other Protein Products Meat in general - RED meat & White meat.  Fatty cuts of meat. Ribs, chicken wings, all processed meats as bacon, sausage, bologna, salami, fatback, hot dogs, bratwurst and packaged luncheon meats.  Dairy Whole or 2% milk, cream, half-and-half, and cream cheese. Whole-fat or sweetened yogurt. Full-fat cheeses or blue cheese. Non-dairy creamers and whipped toppings. Processed cheese, cheese spreads, or cheese curds.  Condiments Onion and garlic salt, seasoned salt, table salt, and sea salt. Canned and packaged gravies. Worcestershire sauce. Tartar sauce. Barbecue sauce. Teriyaki sauce. Soy sauce, including reduced sodium. Steak sauce. Fish sauce. Oyster sauce. Cocktail sauce. Horseradish. Ketchup and mustard. Meat flavorings and tenderizers. Bouillon cubes. Hot sauce. Tabasco sauce.  Marinades. Taco seasonings. Relishes.  Fats and Oils Butter, stick margarine, lard, shortening and bacon fat. Coconut, palm kernel, or palm oils. Regular salad dressings.  Pickles and olives. Salted popcorn and pretzels.  The items listed above may not be a complete list of foods and beverages to avoid.

## 2023-04-20 ENCOUNTER — Encounter: Payer: Self-pay | Admitting: Internal Medicine

## 2023-04-20 LAB — CBC WITH DIFFERENTIAL/PLATELET
Absolute Monocytes: 495 cells/uL (ref 200–950)
Basophils Absolute: 50 cells/uL (ref 0–200)
Basophils Relative: 1.1 %
Eosinophils Absolute: 122 cells/uL (ref 15–500)
Eosinophils Relative: 2.7 %
HCT: 42.8 % (ref 38.5–50.0)
Hemoglobin: 14.6 g/dL (ref 13.2–17.1)
Lymphs Abs: 1580 cells/uL (ref 850–3900)
MCH: 31.2 pg (ref 27.0–33.0)
MCHC: 34.1 g/dL (ref 32.0–36.0)
MCV: 91.5 fL (ref 80.0–100.0)
MPV: 13 fL — ABNORMAL HIGH (ref 7.5–12.5)
Monocytes Relative: 11 %
Neutro Abs: 2255 cells/uL (ref 1500–7800)
Neutrophils Relative %: 50.1 %
Platelets: 137 10*3/uL — ABNORMAL LOW (ref 140–400)
RBC: 4.68 10*6/uL (ref 4.20–5.80)
RDW: 12.3 % (ref 11.0–15.0)
Total Lymphocyte: 35.1 %
WBC: 4.5 10*3/uL (ref 3.8–10.8)

## 2023-04-20 LAB — LIPID PANEL
Cholesterol: 167 mg/dL (ref <200)
HDL: 68 mg/dL (ref >=40)
LDL Cholesterol (Calc): 68 mg/dL (calc)
Non-HDL Cholesterol (Calc): 99 mg/dL (calc) (ref <130)
Total CHOL/HDL Ratio: 2.5 (calc) (ref <5.0)
Triglycerides: 264 mg/dL — ABNORMAL HIGH (ref <150)

## 2023-04-20 LAB — TESTOSTERONE: Testosterone: 69 ng/dL — ABNORMAL LOW (ref 250–827)

## 2023-04-20 LAB — COMPLETE METABOLIC PANEL WITH GFR
AG Ratio: 2.5 (calc) (ref 1.0–2.5)
ALT: 26 U/L (ref 9–46)
AST: 20 U/L (ref 10–35)
Albumin: 4.7 g/dL (ref 3.6–5.1)
Alkaline phosphatase (APISO): 60 U/L (ref 35–144)
BUN: 13 mg/dL (ref 7–25)
CO2: 27 mmol/L (ref 20–32)
Calcium: 9.5 mg/dL (ref 8.6–10.3)
Chloride: 102 mmol/L (ref 98–110)
Creat: 0.72 mg/dL (ref 0.70–1.28)
Globulin: 1.9 g/dL (calc) (ref 1.9–3.7)
Glucose, Bld: 89 mg/dL (ref 65–99)
Potassium: 4.2 mmol/L (ref 3.5–5.3)
Sodium: 137 mmol/L (ref 135–146)
Total Bilirubin: 0.5 mg/dL (ref 0.2–1.2)
Total Protein: 6.6 g/dL (ref 6.1–8.1)
eGFR: 98 mL/min/{1.73_m2} (ref >=60)

## 2023-04-20 LAB — URINALYSIS, ROUTINE W REFLEX MICROSCOPIC
Bilirubin Urine: NEGATIVE
Glucose, UA: NEGATIVE
Hgb urine dipstick: NEGATIVE
Ketones, ur: NEGATIVE
Leukocytes,Ua: NEGATIVE
Nitrite: NEGATIVE
Protein, ur: NEGATIVE
Specific Gravity, Urine: 1.013 (ref 1.001–1.035)
pH: 7.5 (ref 5.0–8.0)

## 2023-04-20 LAB — MICROALBUMIN / CREATININE URINE RATIO
Creatinine, Urine: 55 mg/dL (ref 20–320)
Microalb Creat Ratio: 4 mg/g creat (ref <30)
Microalb, Ur: 0.2 mg/dL

## 2023-04-20 LAB — PSA: PSA: 0.18 ng/mL (ref <=4.00)

## 2023-04-20 LAB — URIC ACID: Uric Acid, Serum: 6.5 mg/dL (ref 4.0–8.0)

## 2023-04-20 LAB — HEMOGLOBIN A1C
Hgb A1c MFr Bld: 5.4 % of total Hgb (ref <5.7)
Mean Plasma Glucose: 108 mg/dL
eAG (mmol/L): 6 mmol/L

## 2023-04-20 LAB — TSH: TSH: 1.71 mIU/L (ref 0.40–4.50)

## 2023-04-20 LAB — VITAMIN D 25 HYDROXY (VIT D DEFICIENCY, FRACTURES): Vit D, 25-Hydroxy: 78 ng/mL (ref 30–100)

## 2023-04-20 LAB — INSULIN, RANDOM: Insulin: 7.1 u[IU]/mL

## 2023-04-20 LAB — MAGNESIUM: Magnesium: 2.2 mg/dL (ref 1.5–2.5)

## 2023-04-21 ENCOUNTER — Other Ambulatory Visit: Payer: Self-pay | Admitting: Internal Medicine

## 2023-04-21 MED ORDER — BUPROPION HCL ER (XL) 300 MG PO TB24: ORAL_TABLET | ORAL | 3 refills | Status: DC

## 2023-04-23 ENCOUNTER — Encounter: Payer: Self-pay | Admitting: Internal Medicine

## 2023-04-23 ENCOUNTER — Other Ambulatory Visit: Payer: Self-pay | Admitting: Internal Medicine

## 2023-04-23 MED ORDER — PROMETHAZINE-DM 6.25-15 MG/5ML PO SYRP: ORAL_SOLUTION | ORAL | 1 refills | Status: DC

## 2023-04-23 MED ORDER — PSEUDOEPHEDRINE HCL ER 120 MG PO TB12: ORAL_TABLET | ORAL | 0 refills | Status: DC

## 2023-04-23 MED ORDER — DEXAMETHASONE 4 MG PO TABS: ORAL_TABLET | ORAL | 0 refills | Status: DC

## 2023-04-30 ENCOUNTER — Ambulatory Visit: Payer: Medicare Other | Admitting: Nurse Practitioner

## 2023-05-08 DIAGNOSIS — H40053 Ocular hypertension, bilateral: Secondary | ICD-10-CM | POA: Diagnosis not present

## 2023-06-05 DIAGNOSIS — H40053 Ocular hypertension, bilateral: Secondary | ICD-10-CM | POA: Diagnosis not present

## 2023-06-20 DIAGNOSIS — M65312 Trigger thumb, left thumb: Secondary | ICD-10-CM | POA: Diagnosis not present

## 2023-07-19 ENCOUNTER — Ambulatory Visit: Payer: Medicare Other | Admitting: Nurse Practitioner

## 2023-07-23 ENCOUNTER — Ambulatory Visit
Admission: EM | Admit: 2023-07-23 | Discharge: 2023-07-23 | Disposition: A | Discharge status: Home / Self Care | Payer: Medicare Other | Attending: Internal Medicine | Admitting: Internal Medicine

## 2023-07-23 DIAGNOSIS — J029 Acute pharyngitis, unspecified: Secondary | ICD-10-CM | POA: Diagnosis not present

## 2023-07-23 DIAGNOSIS — J028 Acute pharyngitis due to other specified organisms: Secondary | ICD-10-CM

## 2023-07-23 MED ORDER — AMOXICILLIN 500 MG PO CAPS
500.0000 mg | ORAL_CAPSULE | Freq: Two times a day (BID) | ORAL | 0 refills | Status: AC
Start: 2023-07-23 — End: 2023-07-30

## 2023-07-23 NOTE — ED Provider Notes (Signed)
[facility]    CSN: [id number] Arrival date & time: 07/23/23  1305      History   Chief Complaint Chief Complaint  Patient presents with   Sore Throat    HPI Cory Carrillo is a 72 y.o. male.   72 year old male who presents to urgent care with complaints of a sore throat.  This started a little over a week ago.  It was mild at first but has gotten much worse.  He was concerned as he was not getting any better and not really having any other significant symptoms.  He has a chronic postnasal drip but normally does not get a sore throat from this.  He denies fevers, chills, nausea, vomiting, congestion, headache or other constitutional symptoms.   Sore Throat Pertinent negatives include no chest pain, no abdominal pain and no shortness of breath.    Past Medical History:  Diagnosis Date   Anxiety    Cervical stenosis of spine    Depression    GERD (gastroesophageal reflux disease)    Glaucoma    Gout    Hyperlipidemia    Hypertension    Hypogonadism male    Prediabetes    Vitamin D deficiency     Patient Active Problem List   Diagnosis Date Noted   Depression, major, in remission (HCC) 05/25/2021   Benign prostatic hyperplasia with nocturia 05/06/2020   Erectile dysfunction 05/05/2020   Aortic atherosclerosis (HCC) by CXR in 2018 05/05/2020   Former smoker (37.5 pack year, quit 2000) 03/31/2019   Overweight (BMI 25.0-29.9) 02/10/2018   CAD in native artery 04/09/2017   Open-angle glaucoma 02/10/2016   Medication management 08/23/2013   Essential hypertension    Mixed hyperlipidemia    GERD     Other abnormal glucose    Gout    Testosterone Deficiency    Vitamin D deficiency     Past Surgical History:  Procedure Laterality Date   LEFT HEART CATH AND CORONARY ANGIOGRAPHY N/A 04/11/2017   Procedure: LEFT HEART CATH AND CORONARY ANGIOGRAPHY;  Surgeon: Lyn Records, MD;  Location: [facility];  Service: Cardiovascular;  Laterality: N/A;        Home Medications    Prior to Admission medications   Medication Sig Start Date End Date Taking? Authorizing Provider  amoxicillin (AMOXIL) 500 MG capsule Take 1 capsule (500 mg total) by mouth 2 (two) times daily for 7 days. 07/23/23 07/30/23 Yes Aneli Zara A, PA-C  Ascorbic Acid (VITAMIN C) 1000 MG tablet Take 1,000 mg by mouth daily.    [provider]  aspirin 81 MG tablet Take 81 mg by mouth daily.    [provider]  buPROPion (WELLBUTRIN XL) 300 MG 24 hr tablet Take   1 tablet  Daily   for Mood, Focus & Concentration. 04/21/23   Lucky Cowboy, MD  Cholecalciferol (VITAMIN D PO) Take 8,000 Units by mouth daily.     [provider]  Cyanocobalamin (VITAMIN B-12) 1000 MCG SUBL Takes 1 tablet  Daily 09/18/22   Lucky Cowboy, MD  dexamethasone (DECADRON) 4 MG tablet Take 1 tab 3 x /day for 2 days,      then 2 x /day for 2  Days,     then 1 tab daily 04/23/23   Lucky Cowboy, MD  finasteride (PROSCAR) 5 MG tablet TAKE DAILY AS DIRECTED 02/18/23   Lucky Cowboy, MD  latanoprost (XALATAN) 0.005 % ophthalmic solution Place 1 drop into both eyes at bedtime.  [provider]  losartan (COZAAR) 50 MG tablet TAKE 1 TABLET DAILY FOR BLOOD PRESSURE 09/14/22   Adela Glimpse, NP  meloxicam (MOBIC) 7.5 MG tablet Take  1 tablet  Daily  with Food for Pain & Inflammation                                               /                                 TAKE                           BY                                 MOUTH 05/08/22   Lucky Cowboy, MD  promethazine-dextromethorphan (PROMETHAZINE-DM) 6.25-15 MG/5ML syrup Take 1 tsp every 4 hours if needed for cough 04/23/23   Lucky Cowboy, MD  pseudoephedrine (SUDAFED) 120 MG 12 hr tablet Take  1 tablet  2 x /day (every 12 hours)  for Sinus & Chest Congestion 04/23/23   Lucky Cowboy, MD  rosuvastatin (CRESTOR) 20 MG tablet Take  1 tablet  Daily for Cholesterol                                                                          /                                                                   TAKE                                         BY                                                 MOUTH                            TAKE 1 TABLET BY MOUTH EVERY DAY FOR CHOLESTEROL 03/04/23   Lucky Cowboy, MD  tadalafil (CIALIS) 20 MG tablet Take 1/2 to 1 tablet every 2 to 3 days as needed for XXXX                               /          TAKE 1/2 TO 1 TABLET BY MOUTH 05/31/21   Lucky Cowboy, MD  timolol (BETIMOL) 0.5 % ophthalmic solution Place 1 drop into both eyes in the morning, at noon, and at bedtime.     [provider]    Family History Family History  Problem Relation Age of Onset   Liver cancer Mother    Heart disease Father    Heart disease Brother    Hypertension Brother    Heart disease Paternal Grandmother    Stroke Paternal Grandmother    Stroke Paternal Grandfather    Healthy Son     Social History Social History   Tobacco Use   Smoking status: Former    Current packs/day: 0.00    Average packs/day: 1.5 packs/day for 25.0 years (37.5 ttl pk-yrs)    Types: Cigarettes    Start date: 07/31/1973    Quit date: 07/31/1998    Years since quitting: 24.9   Smokeless tobacco: Never  Substance Use Topics   Alcohol use: Yes    Alcohol/week: 7.0 standard drinks of alcohol    Types: 7 Standard drinks or equivalent per week   Drug use: No     Allergies   Ace inhibitors and Other   Review of Systems Review of Systems  Constitutional:  Negative for chills and fever.  HENT:  Positive for postnasal drip and sore throat. Negative for ear pain.   Eyes:  Negative for pain and visual disturbance.  Respiratory:  Negative for cough and shortness of breath.   Cardiovascular:  Negative for chest pain and palpitations.  Gastrointestinal:  Negative for abdominal pain and vomiting.  Genitourinary:  Negative for dysuria and hematuria.  Musculoskeletal:  Negative for  arthralgias and back pain.  Skin:  Negative for color change and rash.  Neurological:  Negative for seizures and syncope.  All other systems reviewed and are negative.    Physical Exam Triage Vital Signs ED Triage Vitals  Encounter Vitals Group     BP 07/23/23 1313 130/75     Systolic BP Percentile --      Diastolic BP Percentile --      Pulse Rate 07/23/23 1313 (!) 56     Resp 07/23/23 1313 18     Temp 07/23/23 1313 97.9 F (36.6 C)     Temp Source 07/23/23 1313 Oral     SpO2 07/23/23 1313 98 %     Weight --      Height --      Head Circumference --      Peak Flow --      Pain Score 07/23/23 1312 5     Pain Loc --      Pain Education --      Exclude from Growth Chart --    No data found.  Updated Vital Signs BP 130/75 (BP Location: Right Arm)   Pulse (!) 56   Temp 97.9 F (36.6 C) (Oral)   Resp 18   SpO2 98%   Visual Acuity Right Eye Distance:   Left Eye Distance:   Bilateral Distance:    Right Eye Near:   Left Eye Near:    Bilateral Near:     Physical Exam Vitals and nursing note reviewed.  Constitutional:      General: He is not in acute distress.    Appearance: He is well-developed.  HENT:     Head: Normocephalic and atraumatic.     Right Ear: Tympanic membrane normal.     Left Ear: Tympanic membrane normal.     Mouth/Throat:     Mouth: Mucous  membranes are moist.     Pharynx: Pharyngeal swelling (mild bilateral) and posterior oropharyngeal erythema present.  Eyes:     Conjunctiva/sclera: Conjunctivae normal.  Cardiovascular:     Rate and Rhythm: Normal rate and regular rhythm.     Heart sounds: No murmur heard. Pulmonary:     Effort: Pulmonary effort is normal. No respiratory distress.     Breath sounds: Normal breath sounds.  Abdominal:     Palpations: Abdomen is soft.     Tenderness: There is no abdominal tenderness.  Musculoskeletal:        General: No swelling.     Cervical back: Neck supple.  Skin:    General: Skin is warm and dry.      Capillary Refill: Capillary refill takes less than 2 seconds.  Neurological:     Mental Status: He is alert.  Psychiatric:        Mood and Affect: Mood normal.      UC Treatments / Results  Labs (all labs ordered are listed, but only abnormal results are displayed) Labs Reviewed - No data to display  EKG   Radiology No results found.  Procedures Procedures (including critical care time)  Medications Ordered in UC Medications - No data to display  Initial Impression / Assessment and Plan / UC Course  I have reviewed the triage vital signs and the nursing notes.  Pertinent labs & imaging results that were available during my care of the patient were reviewed by me and considered in my medical decision making (see chart for details).     Sore throat  Acute pharyngitis due to other specified organisms   Acute pharyngitis which is an infection in the throat. Given the length of time, we will treat with the following:  Amoxicillin 500 mg take 1 tablet twice daily for 7 days.  Ibuprofen as needed for pain or if you develop fevers. May use warm salt water gargle.  Return to urgent care or PCP if symptoms worsen or fail to resolve.    Final Clinical Impressions(s) / UC Diagnoses   Final diagnoses:  Sore throat  Acute pharyngitis due to other specified organisms     Discharge Instructions      Acute pharyngitis which is an infection in the throat. Given the length of time, we will treat with the following:  Amoxicillin 500 mg take 1 tablet twice daily for 7 days.  Ibuprofen as needed for pain or if you develop fevers. May use warm salt water gargle.  Return to urgent care or PCP if symptoms worsen or fail to resolve.     ED Prescriptions     Medication Sig Dispense Auth. Provider   amoxicillin (AMOXIL) 500 MG capsule Take 1 capsule (500 mg total) by mouth 2 (two) times daily for 7 days. 14 capsule Landis Martins, [place]      PDMP not reviewed this  encounter.   Landis Martins, [place] 07/23/23 1336

## 2023-07-23 NOTE — Discharge Instructions (Addendum)
Acute pharyngitis which is an infection in the throat. Given the length of time, we will treat with the following:  Amoxicillin 500 mg take 1 tablet twice daily for 7 days.  Ibuprofen as needed for pain or if you develop fevers. May use warm salt water gargle.  Return to urgent care or PCP if symptoms worsen or fail to resolve.

## 2023-07-23 NOTE — ED Triage Notes (Signed)
Pt presents with c/o sore throat x 1 wk.   Home interventions: advil taken today   Denies fever, cough and congestion.

## 2023-07-29 ENCOUNTER — Ambulatory Visit
Admission: EM | Admit: 2023-07-29 | Discharge: 2023-07-29 | Disposition: A | Discharge status: Home / Self Care | Payer: Medicare Other | Attending: Internal Medicine | Admitting: Internal Medicine

## 2023-07-29 DIAGNOSIS — J040 Acute laryngitis: Secondary | ICD-10-CM

## 2023-07-29 DIAGNOSIS — J029 Acute pharyngitis, unspecified: Secondary | ICD-10-CM | POA: Diagnosis not present

## 2023-07-29 LAB — POCT MONO SCREEN (KUC): Mono, POC: NEGATIVE

## 2023-07-29 MED ORDER — FLUTICASONE PROPIONATE 50 MCG/ACT NA SUSP: 1.0000 | Freq: Every day | NASAL | 0 refills | Status: DC

## 2023-07-29 MED ORDER — CETIRIZINE HCL 10 MG PO TABS: 10.0000 mg | ORAL_TABLET | Freq: Every day | ORAL | 0 refills | Status: DC

## 2023-07-29 MED ORDER — DEXAMETHASONE 4 MG PO TABS
8.0000 mg | ORAL_TABLET | Freq: Once | ORAL | Status: AC
  Administered 2023-07-29: 8 mg via ORAL

## 2023-07-29 NOTE — ED Provider Notes (Signed)
[facility]    CSN: [id number] Arrival date & time: 07/29/23  1148      History   Chief Complaint Chief Complaint  Patient presents with   Sore Throat    HPI Cory Carrillo is a 72 y.o. male presents for evaluation of a sore throat.  Patient reports 3 weeks of a sore throat that has been persistent.  States is not better and it is not worse.  He states he has some postnasal drip with minimal cough but otherwise no congestion, fevers, body aches, ear pain, shortness of breath.  No asthma history.  Is a previous smoker.  He was seen in urgent care on 12/23 and was treated presumptively with amoxicillin 500 mg twice daily for 7 days.  Patient has completed this as of today and states his sore throat has not changed.  He does have a history of GERD but denies any current issues with that and states that diet controlled.  Again he denies any fevers, no rashes.  No other concerns at this time.   Sore Throat    Past Medical History:  Diagnosis Date   Anxiety    Cervical stenosis of spine    Depression    GERD (gastroesophageal reflux disease)    Glaucoma    Gout    Hyperlipidemia    Hypertension    Hypogonadism male    Prediabetes    Vitamin D deficiency     Patient Active Problem List   Diagnosis Date Noted   Depression, major, in remission (HCC) 05/25/2021   Benign prostatic hyperplasia with nocturia 05/06/2020   Erectile dysfunction 05/05/2020   Aortic atherosclerosis (HCC) by CXR in 2018 05/05/2020   Former smoker (37.5 pack year, quit 2000) 03/31/2019   Overweight (BMI 25.0-29.9) 02/10/2018   CAD in native artery 04/09/2017   Open-angle glaucoma 02/10/2016   Medication management 08/23/2013   Essential hypertension    Mixed hyperlipidemia    GERD     Other abnormal glucose    Gout    Testosterone Deficiency    Vitamin D deficiency     Past Surgical History:  Procedure Laterality Date   LEFT HEART CATH AND CORONARY ANGIOGRAPHY N/A 04/11/2017    Procedure: LEFT HEART CATH AND CORONARY ANGIOGRAPHY;  Surgeon: Lyn Records, MD;  Location: [facility];  Service: Cardiovascular;  Laterality: N/A;       Home Medications    Prior to Admission medications   Medication Sig Start Date End Date Taking? Authorizing Provider  cetirizine (ZYRTEC) 10 MG tablet Take 1 tablet (10 mg total) by mouth daily for 14 days. 07/29/23 08/12/23 Yes Radford Pax, NP  fluticasone (FLONASE) 50 MCG/ACT nasal spray Place 1 spray into both nostrils daily. 07/29/23  Yes Radford Pax, NP  amoxicillin (AMOXIL) 500 MG capsule Take 1 capsule (500 mg total) by mouth 2 (two) times daily for 7 days. 07/23/23 07/30/23  Landis Martins, PA-C  Ascorbic Acid (VITAMIN C) 1000 MG tablet Take 1,000 mg by mouth daily.    [provider]  aspirin 81 MG tablet Take 81 mg by mouth daily.    [provider]  buPROPion (WELLBUTRIN XL) 300 MG 24 hr tablet Take   1 tablet  Daily   for Mood, Focus & Concentration. 04/21/23   Lucky Cowboy, MD  Cholecalciferol (VITAMIN D PO) Take 8,000 Units by mouth daily.     [provider]  Cyanocobalamin (VITAMIN B-12) 1000 MCG SUBL Takes  1 tablet  Daily 09/18/22   Lucky Cowboy, MD  dexamethasone (DECADRON) 4 MG tablet Take 1 tab 3 x /day for 2 days,      then 2 x /day for 2  Days,     then 1 tab daily 04/23/23   Lucky Cowboy, MD  finasteride (PROSCAR) 5 MG tablet TAKE DAILY AS DIRECTED 02/18/23   Lucky Cowboy, MD  latanoprost (XALATAN) 0.005 % ophthalmic solution Place 1 drop into both eyes at bedtime.    [provider]  losartan (COZAAR) 50 MG tablet TAKE 1 TABLET DAILY FOR BLOOD PRESSURE 09/14/22   Adela Glimpse, NP  meloxicam (MOBIC) 7.5 MG tablet Take  1 tablet  Daily  with Food for Pain & Inflammation                                               /                                 TAKE                           BY                                 MOUTH 05/08/22   Lucky Cowboy, MD   promethazine-dextromethorphan (PROMETHAZINE-DM) 6.25-15 MG/5ML syrup Take 1 tsp every 4 hours if needed for cough 04/23/23   Lucky Cowboy, MD  pseudoephedrine (SUDAFED) 120 MG 12 hr tablet Take  1 tablet  2 x /day (every 12 hours)  for Sinus & Chest Congestion 04/23/23   Lucky Cowboy, MD  rosuvastatin (CRESTOR) 20 MG tablet Take  1 tablet  Daily for Cholesterol                                                                         /                                                                   TAKE                                         BY                                                 MOUTH                            TAKE 1  TABLET BY MOUTH EVERY DAY FOR CHOLESTEROL 03/04/23   Lucky Cowboy, MD  tadalafil (CIALIS) 20 MG tablet Take 1/2 to 1 tablet every 2 to 3 days as needed for XXXX                               /          TAKE 1/2 TO 1 TABLET BY MOUTH 05/31/21   Lucky Cowboy, MD  timolol (BETIMOL) 0.5 % ophthalmic solution Place 1 drop into both eyes in the morning, at noon, and at bedtime.     [provider]    Family History Family History  Problem Relation Age of Onset   Liver cancer Mother    Heart disease Father    Heart disease Brother    Hypertension Brother    Heart disease Paternal Grandmother    Stroke Paternal Grandmother    Stroke Paternal Grandfather    Healthy Son     Social History Social History   Tobacco Use   Smoking status: Former    Current packs/day: 0.00    Average packs/day: 1.5 packs/day for 25.0 years (37.5 ttl pk-yrs)    Types: Cigarettes    Start date: 07/31/1973    Quit date: 07/31/1998    Years since quitting: 25.0   Smokeless tobacco: Never  Substance Use Topics   Alcohol use: Yes    Alcohol/week: 7.0 standard drinks of alcohol    Types: 7 Standard drinks or equivalent per week   Drug use: No     Allergies   Ace inhibitors and Other   Review of Systems Review of Systems  HENT:  Positive for sore throat.       Physical Exam Triage Vital Signs ED Triage Vitals  Encounter Vitals Group     BP 07/29/23 1349 126/77     Systolic BP Percentile --      Diastolic BP Percentile --      Pulse Rate 07/29/23 1349 61     Resp 07/29/23 1349 16     Temp 07/29/23 1349 99.1 F (37.3 C)     Temp Source 07/29/23 1349 Oral     SpO2 07/29/23 1349 97 %     Weight --      Height --      Head Circumference --      Peak Flow --      Pain Score 07/29/23 1346 3     Pain Loc --      Pain Education --      Exclude from Growth Chart --    No data found.  Updated Vital Signs BP 126/77 (BP Location: Left Arm)   Pulse 61   Temp 99.1 F (37.3 C) (Oral)   Resp 16   SpO2 97%   Visual Acuity Right Eye Distance:   Left Eye Distance:   Bilateral Distance:    Right Eye Near:   Left Eye Near:    Bilateral Near:     Physical Exam Vitals and nursing note reviewed.  Constitutional:      General: He is not in acute distress.    Appearance: Normal appearance. He is not ill-appearing or toxic-appearing.  HENT:     Head: Normocephalic and atraumatic.     Right Ear: Tympanic membrane and ear canal normal.     Left Ear: Tympanic membrane and ear canal normal.     Nose: No congestion or rhinorrhea.  Mouth/Throat:     Mouth: Mucous membranes are moist.     Pharynx: Oropharynx is clear. Uvula midline. Posterior oropharyngeal erythema and postnasal drip present. No oropharyngeal exudate.     Tonsils: No tonsillar exudate or tonsillar abscesses.  Eyes:     Pupils: Pupils are equal, round, and reactive to light.  Cardiovascular:     Rate and Rhythm: Normal rate and regular rhythm.     Heart sounds: Normal heart sounds.  Pulmonary:     Effort: Pulmonary effort is normal.     Breath sounds: Normal breath sounds.  Musculoskeletal:     Cervical back: Normal range of motion and neck supple.  Lymphadenopathy:     Cervical: No cervical adenopathy.  Skin:    General: Skin is warm and dry.  Neurological:      General: No focal deficit present.     Mental Status: He is alert and oriented to person, place, and time.  Psychiatric:        Mood and Affect: Mood normal.        Behavior: Behavior normal.      UC Treatments / Results  Labs (all labs ordered are listed, but only abnormal results are displayed) Labs Reviewed  POCT MONO SCREEN [facility])    EKG   Radiology No results found.  Procedures Procedures (including critical care time)  Medications Ordered in UC Medications  dexamethasone (DECADRON) tablet 8 mg (has no administration in time range)    Initial Impression / Assessment and Plan / UC Course  I have reviewed the triage vital signs and the nursing notes.  Pertinent labs & imaging results that were available during my care of the patient were reviewed by me and considered in my medical decision making (see chart for details).     Reviewed exam and symptoms with patient.  No red flags.  Patient presenting with 3 weeks of stable but persisting sore throat.  Negative mono.  Completed amoxicillin today without change.  Exam more consistent with postnasal drip as potential cause.  Also discussed GERD as a potential cause as well.  Do not feel additional antibiotics are taken at this time but will do trial of Flonase and cetirizine.  Will defer strep testing as he is on amoxicillin currently and it would not be a reliable result.  Advised PCP follow-up next week for further workup/treatment if symptoms do not improve.  ER precautions reviewed. Final Clinical Impressions(s) / UC Diagnoses   Final diagnoses:  Sore throat  Acute pharyngitis, unspecified etiology  Laryngitis     Discharge Instructions      Start cetirizine daily as well as Flonase.  Please follow-up with your PCP next week if your symptoms do not improve.  Please go to the ER if you develop any worsening symptoms.  I hope you feel better soon!     ED Prescriptions     Medication Sig Dispense Auth.  Provider   fluticasone (FLONASE) 50 MCG/ACT nasal spray Place 1 spray into both nostrils daily. 15.8 mL Radford Pax, NP   cetirizine (ZYRTEC) 10 MG tablet Take 1 tablet (10 mg total) by mouth daily for 14 days. 14 tablet Radford Pax, NP      PDMP not reviewed this encounter.   Radford Pax, NP 07/29/23 1451

## 2023-07-29 NOTE — Discharge Instructions (Signed)
Start cetirizine daily as well as Flonase.  Please follow-up with your PCP next week if your symptoms do not improve.  Please go to the ER if you develop any worsening symptoms.  I hope you feel better soon!

## 2023-07-29 NOTE — ED Triage Notes (Signed)
Pt presents to UC for c/o sore throat x2 weeks. Pt was seen 6 days ago for same symptoms and given amoxicillin. His last dose is today. Sore throat has not improved. Denies fevers. Has taken advil for pain.

## 2023-08-06 ENCOUNTER — Encounter: Payer: Self-pay | Admitting: Nurse Practitioner

## 2023-08-06 ENCOUNTER — Other Ambulatory Visit: Payer: Self-pay

## 2023-08-06 ENCOUNTER — Ambulatory Visit (INDEPENDENT_AMBULATORY_CARE_PROVIDER_SITE_OTHER): Payer: Medicare Other | Admitting: Nurse Practitioner

## 2023-08-06 VITALS — BP 130/78 | HR 71 | Temp 97.2°F | Ht 73.0 in | Wt 210.0 lb

## 2023-08-06 DIAGNOSIS — R059 Cough, unspecified: Secondary | ICD-10-CM

## 2023-08-06 DIAGNOSIS — R0982 Postnasal drip: Secondary | ICD-10-CM | POA: Diagnosis not present

## 2023-08-06 DIAGNOSIS — I10 Essential (primary) hypertension: Secondary | ICD-10-CM

## 2023-08-06 MED ORDER — IPRATROPIUM BROMIDE 0.03 % NA SOLN: 2.0000 | Freq: Three times a day (TID) | NASAL | 2 refills | Status: DC

## 2023-08-06 MED ORDER — ESOMEPRAZOLE MAGNESIUM 40 MG PO CPDR: 40.0000 mg | DELAYED_RELEASE_CAPSULE | Freq: Two times a day (BID) | ORAL | 0 refills | Status: DC

## 2023-08-06 NOTE — Patient Instructions (Signed)
 Sore Throat A sore throat is pain, burning, irritation, or scratchiness in the throat. When you have a sore throat, you may feel pain or tenderness in your throat when you swallow or talk. Many things can cause a sore throat, including: An infection. Seasonal allergies. Dryness in the air. Irritants, such as smoke or pollution. Radiation treatment for cancer. Gastroesophageal reflux disease (GERD). A tumor. A sore throat is often the first sign of another sickness. It may happen with other symptoms, such as coughing, sneezing, fever, and swollen neck glands. Most sore throats go away without medical treatment. Follow these instructions at home:     Medicines Take over-the-counter and prescription medicines only as told by your health care provider. Children often get sore throats. Do not give your child aspirin because of the association with Reye's syndrome. Use throat sprays to soothe your throat as told by your health care provider. Managing pain To help with pain, try: Sipping warm liquids, such as broth, herbal tea, or warm water. Eating or drinking cold or frozen liquids, such as frozen ice pops. Gargling with a mixture of salt and water 3-4 times a day or as needed. To make salt water, completely dissolve -1 tsp (3-6 g) of salt in 1 cup (237 mL) of warm water. Sucking on hard candy or throat lozenges. Putting a cool-mist humidifier in your bedroom at night to moisten the air. Sitting in the bathroom with the door closed for 5-10 minutes while you run hot water in the shower. General instructions Do not use any products that contain nicotine or tobacco. These products include cigarettes, chewing tobacco, and vaping devices, such as e-cigarettes. If you need help quitting, ask your health care provider. Rest as needed. Drink enough fluid to keep your urine pale yellow. Wash your hands often with soap and water for at least 20 seconds. If soap and water are not available, use hand  sanitizer. Contact a health care provider if: You have a fever for more than 2-3 days. You have symptoms that last for more than 2-3 days. Your throat does not get better within 7 days. You have a fever and your symptoms suddenly get worse. Get help right away if: You have difficulty breathing. You cannot swallow fluids, soft foods, or your saliva. You have increased swelling in your throat or neck. You have persistent nausea and vomiting. These symptoms may represent a serious problem that is an emergency. Do not wait to see if the symptoms will go away. Get medical help right away. Call your local emergency services (911 in the [place] Do not drive yourself to the hospital. Summary A sore throat is pain, burning, irritation, or scratchiness in the throat. Many things can cause a sore throat. Take over-the-counter medicines only as told by your health care provider. Rest as needed. Drink enough fluid to keep your urine pale yellow. Contact a health care provider if your throat does not get better within 7 days. This information is not intended to replace advice given to you by your health care provider. Make sure you discuss any questions you have with your health care provider. Document Revised: 10/13/2020 Document Reviewed: 10/13/2020 Elsevier Patient Education  2024 [employer].

## 2023-08-06 NOTE — Progress Notes (Signed)
 Assessment and Plan:  Cory Carrillo was seen today for acute visit.  Diagnoses and all orders for this visit:  Essential hypertension - continue medications, DASH diet, exercise and monitor at home. Call if greater than 130/80.    Post-nasal drip Stop Afrin, start Ipratropium every 8 ours as needed -     ipratropium (ATROVENT ) 0.03 % nasal spray; Place 2 sprays into the nose 3 (three) times daily.  Cough, unspecified type Start 2 week course of Nexium , if no improvement of sore throat notify the office and will refer to ENT -     esomeprazole  (NEXIUM ) 40 MG capsule; Take 1 capsule (40 mg total) by mouth 2 (two) times daily before a meal for 14 days.       Further disposition pending results of labs. Discussed med's effects and SE's.   Over 30 minutes of exam, counseling, chart review, and critical decision making was performed.   Future Appointments  Date Time Provider Department Center  10/23/2023  2:30 PM Tonita Fallow, MD [facility] None  01/24/2024  3:30 PM Laurice President, NP [facility] None  05/01/2024  2:00 PM Tonita Fallow, MD [facility] None    ------------------------------------------------------------------------------------------------------------------   HPI BP 130/78   Pulse 71   Temp (!) 97.2 F (36.2 C)   Ht 6' 1 (1.854 m)   Wt 210 lb (95.3 kg)   SpO2 99%   BMI 27.71 kg/m  73 y.o.male presents for persistent left sided sore throat that began 07/14/23.  He was seen at urgent care on 07/23/23 and started on 7 day course of Amoxicillin .  07/29/23 he was not improved so went to urgent care again and was tested for Mono which was negative. Believed to be allergy/post nasal drip given 1 time dose of dexamethasone  8 mg and 14 days of zyrtec  and flonase .  Steroid helped slightly but symptoms returned. Still having left sided throat pain and feels left sinus is clogged- unable to take Zyrtec  as it dries him out. He does still have his tonsils. Pain is the least when he  first wakes up. No pain with swallowing. He will get an ache that comes and goes  Does not take any GERD medications although it has been mentioned as potential cause. Has seen ENT in the past many years ago and was believed to have GERD   BP well controlled with Losartan  50 mg every day  BP Readings from Last 3 Encounters:  08/06/23 130/78  07/29/23 126/77  07/23/23 130/75  Denies headaches, chest pain, shortness of breath and dizziness  BMI is Body mass index is 27.71 kg/m., he has been working on diet and exercise. Wt Readings from Last 3 Encounters:  08/06/23 210 lb (95.3 kg)  04/19/23 205 lb 3.2 oz (93.1 kg)  09/18/22 202 lb (91.6 kg)    Past Medical History:  Diagnosis Date   Anxiety    Cervical stenosis of spine    Depression    GERD (gastroesophageal reflux disease)    Glaucoma    Gout    Hyperlipidemia    Hypertension    Hypogonadism male    Prediabetes    Vitamin D  deficiency      Allergies  Allergen Reactions   Ace Inhibitors     cough   Other Itching    Synthetic opiods  Full Body itching   Zyrtec  [Cetirizine ] Other (See Comments)    Made hyper    Current Outpatient Medications on File Prior to Visit  Medication Sig   Ascorbic  Acid (VITAMIN C) 1000 MG tablet Take 1,000 mg by mouth daily.   aspirin  81 MG tablet Take 81 mg by mouth daily.   BITTER MELON PO Take by mouth.   brimonidine (ALPHAGAN) 0.2 % ophthalmic solution SMARTSIG:1 Drop(s) In Eye(s) Every 12 Hours   Cholecalciferol (VITAMIN D  PO) Take 10,000 Units by mouth daily.   CINNAMON PO Take by mouth.   Cyanocobalamin  (VITAMIN B-12) 1000 MCG SUBL Takes 1 tablet  Daily   finasteride  (PROSCAR ) 5 MG tablet TAKE DAILY AS DIRECTED   fluticasone  (FLONASE ) 50 MCG/ACT nasal spray Place 1 spray into both nostrils daily.   latanoprost (XALATAN) 0.005 % ophthalmic solution Place 1 drop into both eyes at bedtime.   losartan  (COZAAR ) 50 MG tablet TAKE 1 TABLET DAILY FOR BLOOD PRESSURE   Misc Natural  Products (TURMERIC CURCUMIN) CAPS Take by mouth.   rosuvastatin  (CRESTOR ) 20 MG tablet Take  1 tablet  Daily for Cholesterol                                                                         /                                                                   TAKE                                         BY                                                 MOUTH                            TAKE 1 TABLET BY MOUTH EVERY DAY FOR CHOLESTEROL   tadalafil  (CIALIS ) 20 MG tablet Take 1/2 to 1 tablet every 2 to 3 days as needed for XXXX                               /          TAKE 1/2 TO 1 TABLET BY MOUTH   Zinc 50 MG TABS Take by mouth.   meloxicam  (MOBIC ) 7.5 MG tablet Take  1 tablet  Daily  with Food for Pain & Inflammation                                               /  TAKE                           BY                                 MOUTH (Patient not taking: Reported on 08/06/2023)   No current facility-administered medications on file prior to visit.    ROS: all negative except above.   Physical Exam:  BP 130/78   Pulse 71   Temp (!) 97.2 F (36.2 C)   Ht 6' 1 (1.854 m)   Wt 210 lb (95.3 kg)   SpO2 99%   BMI 27.71 kg/m   General Appearance: Well nourished, in no apparent distress. Eyes: PERRLA, EOMs, conjunctiva no swelling or erythema Sinuses: No Frontal/maxillary tenderness ENT/Mouth: Ext aud canals clear, TMs without erythema, bulging. No erythema, swelling, or exudate on post pharynx.  Tonsils not swollen or erythematous. Hearing normal.  Neck: Supple, thyroid  normal.  Respiratory: Respiratory effort normal, BS equal bilaterally without rales, rhonchi, wheezing or stridor.  Cardio: RRR with no MRGs. Brisk peripheral pulses without edema.  Abdomen: Soft, + BS.  Non tender, no guarding, rebound, hernias, masses. Lymphatics: Non tender without lymphadenopathy.  Musculoskeletal: Full ROM, 5/5 strength, normal gait.  Skin: Warm, dry without rashes,  lesions, ecchymosis.  Neuro: Cranial nerves intact. Normal muscle tone, no cerebellar symptoms. Sensation intact.  Psych: Awake and oriented X 3, normal affect, Insight and Judgment appropriate.     Adalae Baysinger E Marielys Trinidad, NP 3:42 PM [facility]

## 2023-08-15 ENCOUNTER — Other Ambulatory Visit: Payer: Self-pay | Admitting: Nurse Practitioner

## 2023-08-15 DIAGNOSIS — J312 Chronic pharyngitis: Secondary | ICD-10-CM

## 2023-08-15 NOTE — Telephone Encounter (Signed)
Can you talk to him

## 2023-09-10 DIAGNOSIS — H40053 Ocular hypertension, bilateral: Secondary | ICD-10-CM | POA: Diagnosis not present

## 2023-09-11 ENCOUNTER — Encounter: Payer: Self-pay | Admitting: Internal Medicine

## 2023-09-19 ENCOUNTER — Other Ambulatory Visit: Payer: Self-pay

## 2023-09-20 MED ORDER — TADALAFIL 20 MG PO TABS: ORAL_TABLET | ORAL | 0 refills | Status: DC

## 2023-09-24 ENCOUNTER — Other Ambulatory Visit: Payer: Self-pay

## 2023-09-24 DIAGNOSIS — I10 Essential (primary) hypertension: Secondary | ICD-10-CM

## 2023-09-24 MED ORDER — LOSARTAN POTASSIUM 50 MG PO TABS: ORAL_TABLET | ORAL | 0 refills | Status: DC

## 2023-09-28 ENCOUNTER — Other Ambulatory Visit: Payer: Self-pay

## 2023-09-28 MED ORDER — TADALAFIL 20 MG PO TABS: ORAL_TABLET | ORAL | 0 refills | Status: AC

## 2023-10-23 ENCOUNTER — Ambulatory Visit: Payer: Medicare Other | Admitting: Internal Medicine

## 2023-12-04 DIAGNOSIS — H10022 Other mucopurulent conjunctivitis, left eye: Secondary | ICD-10-CM | POA: Diagnosis not present

## 2024-01-07 ENCOUNTER — Encounter: Payer: Self-pay | Admitting: Internal Medicine

## 2024-01-24 ENCOUNTER — Ambulatory Visit: Payer: Medicare Other | Admitting: Nurse Practitioner

## 2024-02-13 DIAGNOSIS — I10 Essential (primary) hypertension: Secondary | ICD-10-CM | POA: Diagnosis not present

## 2024-02-13 DIAGNOSIS — Z136 Encounter for screening for cardiovascular disorders: Secondary | ICD-10-CM | POA: Diagnosis not present

## 2024-02-13 DIAGNOSIS — Z0000 Encounter for general adult medical examination without abnormal findings: Secondary | ICD-10-CM | POA: Diagnosis not present

## 2024-02-13 DIAGNOSIS — Z7185 Encounter for immunization safety counseling: Secondary | ICD-10-CM | POA: Diagnosis not present

## 2024-02-13 DIAGNOSIS — Z1159 Encounter for screening for other viral diseases: Secondary | ICD-10-CM | POA: Diagnosis not present

## 2024-02-13 DIAGNOSIS — Z23 Encounter for immunization: Secondary | ICD-10-CM | POA: Diagnosis not present

## 2024-02-13 DIAGNOSIS — N529 Male erectile dysfunction, unspecified: Secondary | ICD-10-CM | POA: Diagnosis not present

## 2024-02-13 DIAGNOSIS — Z79899 Other long term (current) drug therapy: Secondary | ICD-10-CM | POA: Diagnosis not present

## 2024-02-13 DIAGNOSIS — E678 Other specified hyperalimentation: Secondary | ICD-10-CM | POA: Diagnosis not present

## 2024-02-13 DIAGNOSIS — E7800 Pure hypercholesterolemia, unspecified: Secondary | ICD-10-CM | POA: Diagnosis not present

## 2024-02-13 DIAGNOSIS — Z789 Other specified health status: Secondary | ICD-10-CM | POA: Diagnosis not present

## 2024-03-05 DIAGNOSIS — D696 Thrombocytopenia, unspecified: Secondary | ICD-10-CM | POA: Diagnosis not present

## 2024-03-05 DIAGNOSIS — Z23 Encounter for immunization: Secondary | ICD-10-CM | POA: Diagnosis not present

## 2024-03-10 DIAGNOSIS — H40053 Ocular hypertension, bilateral: Secondary | ICD-10-CM | POA: Diagnosis not present

## 2024-03-27 DIAGNOSIS — Z136 Encounter for screening for cardiovascular disorders: Secondary | ICD-10-CM | POA: Diagnosis not present

## 2024-03-27 DIAGNOSIS — Z87891 Personal history of nicotine dependence: Secondary | ICD-10-CM | POA: Diagnosis not present

## 2024-03-27 DIAGNOSIS — I714 Abdominal aortic aneurysm, without rupture, unspecified: Secondary | ICD-10-CM | POA: Diagnosis not present

## 2024-04-01 DIAGNOSIS — Z1212 Encounter for screening for malignant neoplasm of rectum: Secondary | ICD-10-CM | POA: Diagnosis not present

## 2024-04-01 DIAGNOSIS — Z1211 Encounter for screening for malignant neoplasm of colon: Secondary | ICD-10-CM | POA: Diagnosis not present

## 2024-04-04 LAB — COLOGUARD: COLOGUARD: NEGATIVE

## 2024-05-01 ENCOUNTER — Encounter: Payer: Medicare Other | Admitting: Internal Medicine

## 2024-07-17 DIAGNOSIS — H401111 Primary open-angle glaucoma, right eye, mild stage: Secondary | ICD-10-CM | POA: Diagnosis not present

## 2024-08-19 NOTE — Progress Notes (Unsigned)
 "  OFFICE NOTE:    Date:  08/20/2024  ID:  SULO JANCZAK, DOB 08-21-1950, MRN [id number] PCP: Tonita Fallow, MD (Inactive)  [facility] Providers Cardiologist:  None        Bradycardia Non-obstructive coronary artery disease Cardiac catheterization (2017-04-11): Non-obstructive CAD, mid RCA 30%, proximal and mid LAD 30%, EF 65% Abdominal aortic aneurysm Reported US  2025: 3 cm Hypertension Hyperlipidemia Gastroesophageal reflux disease Gout Prediabetes Anxiety FHx of CAD  Former Smoker        Discussed the use of AI scribe software for clinical note transcription with the patient, who gave verbal consent to proceed. History of Present Illness Cory Carrillo is a 74 y.o. male referred by Rolinda Millman, MD for bradycardia.   He has a history of bradycardia with a pulse averaging between 48 to 52 beats per minute.  He has not had symptoms of lightheadedness, fainting, or dizziness. His bradycardia has been noted over the years, with a heart rate of 41 recorded on an EKG with primary care in September 2024.  He used to take atenolol  for blood pressure.  This was stopped years ago when his blood pressure improved.  He was also placed on beta-blocker eyedrops for glaucoma.  These were stopped secondary to bradycardia.  He experiences occasional chest tightness, which is infrequent.  He had symptoms once with exertion.  These were brief and did not recur.  He has noted the symptoms for many years without significant change.  He has not had jaw pain, arm pain, or shortness of breath.  He occasionally feels palpitations or 'flip flops' but denies any recent increase in frequency or severity.  Family history is significant for coronary artery disease, with his father and brother having had heart attacks. His brother, who is 19, had a heart attack in his 9s, and his father had one in his 85s.  Social history reveals he is a former smoker, having quit in 2000 after smoking from age 36 to  43. He describes himself as a heavy drinker, consuming about a case of beer per week, and is working on reducing his intake due to family pressure. 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 He is married with one biological son.    ROS-See HPI     Studies Reviewed:  EKG Interpretation Date/Time:  Wednesday August 20 2024 10:25:27 EST Ventricular Rate:  58 PR Interval:  152 QRS Duration:  76 QT Interval:  414 QTC Calculation: 406 R Axis:   67  Text Interpretation: Sinus bradycardia Confirmed by Lelon Hamilton [phone]) on 08/20/2024 11:06:02 AM    LABS WBC (08/15/2024): 4.9 Hemoglobin (08/15/2024): 14.1 MCV (08/15/2024): 91 Platelet count (08/15/2024): 120,000 Glucose (08/15/2024): 97 Creatinine (08/15/2024): 0.71 eGFR (08/15/2024): 98 Potassium (08/15/2024): 4.4 Total protein (08/15/2024): 6.7 Albumin (08/15/2024): 4.8 ALT (08/15/2024): 30 Albumin/creatinine ratio (08/15/2024): <11 TSH (08/15/2024): 1.78 Magnesium  (08/15/2024): 2.4 LDL (02/13/2024): 57       Physical Exam:  VS:  BP (!) 140/80   Pulse (!) 58   Ht 6' 1 (1.854 m)   Wt 203 lb 1.9 oz (92.1 kg)   SpO2 96%   BMI 26.80 kg/m        Wt Readings from Last 3 Encounters:  08/20/24 203 lb 1.9 oz (92.1 kg)  08/06/23 210 lb (95.3 kg)  04/19/23 205 lb 3.2 oz (93.1 kg)    Constitutional:      Appearance: Healthy appearance. Not in distress.  Neck:     Vascular: JVD normal.  Pulmonary:     Breath sounds: Normal breath sounds. No wheezing. No rales.  Cardiovascular:     Normal rate. Regular rhythm.     Murmurs: There is no murmur.  Edema:    Peripheral edema absent.  Abdominal:     Palpations: Abdomen is soft.       Assessment and Plan:    Assessment & Plan Bradycardia Bradycardia seems to be fairly chronic.  He is asymptomatic.  He has not had syncope, near syncope or symptoms of exercise intolerance.  He has not really had  significant fatigue.  Recent TSH normal.  No evidence of conduction system disease on his EKG today.  He does have occasional chest discomfort.  I have recommended proceeding with an echocardiogram to rule out structural heart disease.  I have also recommended proceeding with a 2-week Zio patch monitor to assess for significant pauses or intermittent high degree heart block.  If testing is unrevealing, he can follow-up as needed.  We discussed symptoms that would prompt earlier follow-up. - 2D echocardiogram - Zio patch XT monitor x 2 weeks - Follow-up as needed, based upon test results. CAD in native artery Mild nonobstructive CAD on cardiac catheterization in 2018.  No symptoms of exertional chest discomfort. - Continue rosuvastatin  20 mg daily Essential hypertension Blood pressure elevated today.  This is usually well-controlled. - Continue losartan  25 mg daily - Continue follow-up with primary care Mixed hyperlipidemia LDL optimal on most recent labs in July 2025. - Continue rosuvastatin  20 mg daily. Abdominal aortic aneurysm (AAA) without rupture, unspecified part Monitored by primary care.  He notes a recent history of ultrasound that demonstrated a 3 cm AAA.  He notes that he will have follow-up testing done again in 3 years.            Dispo:  Return for follow up as needed based upon test results.  Signed, Glendia Ferrier, PA-C    "

## 2024-08-19 NOTE — Assessment & Plan Note (Signed)
 Mild nonobstructive CAD on cardiac catheterization in 2018. ***

## 2024-08-20 ENCOUNTER — Ambulatory Visit

## 2024-08-20 ENCOUNTER — Encounter: Payer: Self-pay | Admitting: Physician Assistant

## 2024-08-20 ENCOUNTER — Ambulatory Visit: Attending: Physician Assistant | Admitting: Physician Assistant

## 2024-08-20 VITALS — BP 140/80 | HR 58 | Ht 73.0 in | Wt 203.1 lb

## 2024-08-20 DIAGNOSIS — R001 Bradycardia, unspecified: Secondary | ICD-10-CM | POA: Insufficient documentation

## 2024-08-20 DIAGNOSIS — I10 Essential (primary) hypertension: Secondary | ICD-10-CM | POA: Insufficient documentation

## 2024-08-20 DIAGNOSIS — I2510 Atherosclerotic heart disease of native coronary artery without angina pectoris: Secondary | ICD-10-CM | POA: Insufficient documentation

## 2024-08-20 DIAGNOSIS — E782 Mixed hyperlipidemia: Secondary | ICD-10-CM | POA: Diagnosis not present

## 2024-08-20 DIAGNOSIS — I714 Abdominal aortic aneurysm, without rupture, unspecified: Secondary | ICD-10-CM | POA: Diagnosis not present

## 2024-08-20 NOTE — Assessment & Plan Note (Addendum)
 LDL optimal on most recent labs in July 2025. - Continue rosuvastatin  20 mg daily.

## 2024-08-20 NOTE — Patient Instructions (Signed)
 Medication Instructions:  Your physician recommends that you continue on your current medications as directed. Please refer to the Current Medication list given to you today.  *If you need a refill on your cardiac medications before your next appointment, please call your pharmacy*  Lab Work: None ordered  If you have labs (blood work) drawn today and your tests are completely normal, you will receive your results only by: MyChart Message (if you have MyChart) OR A paper copy in the mail If you have any lab test that is abnormal or we need to change your treatment, we will call you to review the results.  Testing/Procedures: Your physician has requested that you have an echocardiogram. Echocardiography is a painless test that uses sound waves to create images of your heart. It provides your doctor with information about the size and shape of your heart and how well your hearts chambers and valves are working. This procedure takes approximately one hour. There are no restrictions for this procedure. Please do NOT wear cologne, perfume, aftershave, or lotions (deodorant is allowed). Please arrive 15 minutes prior to your appointment time.  Please note: We ask at that you not bring children with you during ultrasound (echo/ vascular) testing. Due to room size and safety concerns, children are not allowed in the ultrasound rooms during exams. Our front office staff cannot provide observation of children in our lobby area while testing is being conducted. An adult accompanying a patient to their appointment will only be allowed in the ultrasound room at the discretion of the ultrasound technician under special circumstances. We apologize for any inconvenience.   ZIO XT- Long Term Monitor Instructions  Your physician has requested you wear a ZIO patch monitor for 14 days.  This is a single patch monitor. [employer] supplies one patch monitor per enrollment. Additional stickers are not available.  Please do not apply patch if you will be having a Nuclear Stress Test,  Echocardiogram, Cardiac CT, MRI, or Chest Xray during the period you would be wearing the  monitor. The patch cannot be worn during these tests. You cannot remove and re-apply the  ZIO XT patch monitor.  Your ZIO patch monitor will be mailed 3 day USPS to your address on file. It may take 3-5 days  to receive your monitor after you have been enrolled.  Once you have received your monitor, please review the enclosed instructions. Your monitor  has already been registered assigning a specific monitor serial # to you.  Billing and Patient Assistance Program Information  We have supplied [employer] with any of your insurance information on file for billing purposes. [employer] offers a sliding scale Patient Assistance Program for patients that do not have  insurance, or whose insurance does not completely cover the cost of the ZIO monitor.  You must apply for the Patient Assistance Program to qualify for this discounted rate.  To apply, please call [employer] at [phone], select option 4, select option 2, ask to apply for  Patient Assistance Program. Meredeth will ask your household income, and how many people  are in your household. They will quote your out-of-pocket cost based on that information.  [employer] will also be able to set up a 42-month, interest-free payment plan if needed.  Applying the monitor   Shave hair from upper left chest.  Hold abrader disc by orange tab. Rub abrader in 40 strokes over the upper left chest as  indicated in your monitor instructions.  Clean area with 4 enclosed alcohol  pads. Let dry.  Apply patch as indicated in monitor instructions. Patch will be placed under collarbone on left  side of chest with arrow pointing upward.  Rub patch adhesive wings for 2 minutes. Remove white label marked 1. Remove the white  label marked 2. Rub patch adhesive wings for 2 additional minutes.  While  looking in a mirror, press and release button in center of patch. A small green light will  flash 3-4 times. This will be your only indicator that the monitor has been turned on.  Do not shower for the first 24 hours. You may shower after the first 24 hours.  Press the button if you feel a symptom. You will hear a small click. Record Date, Time and  Symptom in the Patient Logbook.  When you are ready to remove the patch, follow instructions on the last 2 pages of Patient  Logbook. Stick patch monitor onto the last page of Patient Logbook.  Place Patient Logbook in the blue and white box. Use locking tab on box and tape box closed  securely. The blue and white box has prepaid postage on it. Please place it in the mailbox as  soon as possible. Your physician should have your test results approximately 7 days after the  monitor has been mailed back to [facility].  Call [facility] Customer Care at [phone] if you have questions regarding  your ZIO XT patch monitor. Call them immediately if you see an orange light blinking on your  monitor.  If your monitor falls off in less than 4 days, contact our Monitor department at [phone].  If your monitor becomes loose or falls off after 4 days call [employer] at [phone] for  suggestions on securing your monitor   Follow-Up: At [facility], you and your health needs are our priority.  As part of our continuing mission to provide you with exceptional heart care, our providers are all part of one team.  This team includes your primary Cardiologist (physician) and Advanced Practice Providers or APPs (Physician Assistants and Nurse Practitioners) who all work together to provide you with the care you need, when you need it.  Your next appointment:   As needed / Based upon results   Provider:   Glendia Ferrier, PA-C          We recommend signing up for the patient portal called MyChart.  Sign up information is provided on this  After Visit Summary.  MyChart is used to connect with patients for Virtual Visits (Telemedicine).  Patients are able to view lab/test results, encounter notes, upcoming appointments, etc.  Non-urgent messages can be sent to your provider as well.   To learn more about what you can do with MyChart, go to [email or web address].   Other Instructions

## 2024-08-20 NOTE — Assessment & Plan Note (Addendum)
 Monitored by primary care.  He notes a recent history of ultrasound that demonstrated a 3 cm AAA.  He notes that he will have follow-up testing done again in 3 years.

## 2024-08-20 NOTE — Progress Notes (Unsigned)
Enrolled patient for a 14 day Zio XT monitor to be mailed to patients home  Cory Carrillo to read

## 2024-08-20 NOTE — Assessment & Plan Note (Addendum)
 Blood pressure elevated today.  This is usually well-controlled. - Continue losartan  25 mg daily - Continue follow-up with primary care

## 2024-09-19 ENCOUNTER — Ambulatory Visit (HOSPITAL_COMMUNITY)
